# Patient Record
Sex: Female | Born: 1935 | Race: White | Hispanic: No | Smoking: Never smoker
Health system: Southern US, Community
[De-identification: ages and names within clinical notes are randomized; demographics above are authoritative.]

## PROBLEM LIST (undated history)

## (undated) DIAGNOSIS — M542 Cervicalgia: Secondary | ICD-10-CM

## (undated) DIAGNOSIS — R42 Dizziness and giddiness: Secondary | ICD-10-CM

## (undated) DIAGNOSIS — E039 Hypothyroidism, unspecified: Secondary | ICD-10-CM

## (undated) DIAGNOSIS — E785 Hyperlipidemia, unspecified: Secondary | ICD-10-CM

## (undated) DIAGNOSIS — R0989 Other specified symptoms and signs involving the circulatory and respiratory systems: Secondary | ICD-10-CM

## (undated) DIAGNOSIS — L708 Other acne: Secondary | ICD-10-CM

## (undated) DIAGNOSIS — I480 Paroxysmal atrial fibrillation: Secondary | ICD-10-CM

## (undated) DIAGNOSIS — I219 Acute myocardial infarction, unspecified: Secondary | ICD-10-CM

## (undated) DIAGNOSIS — Z9119 Patient's noncompliance with other medical treatment and regimen: Secondary | ICD-10-CM

## (undated) DIAGNOSIS — Z9181 History of falling: Secondary | ICD-10-CM

## (undated) DIAGNOSIS — R55 Syncope and collapse: Secondary | ICD-10-CM

## (undated) DIAGNOSIS — I4891 Unspecified atrial fibrillation: Secondary | ICD-10-CM

## (undated) DIAGNOSIS — G311 Senile degeneration of brain, not elsewhere classified: Secondary | ICD-10-CM

## (undated) DIAGNOSIS — M2040 Other hammer toe(s) (acquired), unspecified foot: Secondary | ICD-10-CM

## (undated) DIAGNOSIS — R5381 Other malaise: Secondary | ICD-10-CM

## (undated) DIAGNOSIS — F329 Major depressive disorder, single episode, unspecified: Secondary | ICD-10-CM

## (undated) DIAGNOSIS — R002 Palpitations: Secondary | ICD-10-CM

## (undated) DIAGNOSIS — IMO0001 Reserved for inherently not codable concepts without codable children: Secondary | ICD-10-CM

## (undated) DIAGNOSIS — G609 Hereditary and idiopathic neuropathy, unspecified: Secondary | ICD-10-CM

## (undated) DIAGNOSIS — E1165 Type 2 diabetes mellitus with hyperglycemia: Secondary | ICD-10-CM

## (undated) DIAGNOSIS — G4700 Insomnia, unspecified: Secondary | ICD-10-CM

## (undated) DIAGNOSIS — F070 Personality change due to known physiological condition: Secondary | ICD-10-CM

## (undated) DIAGNOSIS — I6529 Occlusion and stenosis of unspecified carotid artery: Secondary | ICD-10-CM

## (undated) DIAGNOSIS — J309 Allergic rhinitis, unspecified: Secondary | ICD-10-CM

## (undated) DIAGNOSIS — I6789 Other cerebrovascular disease: Secondary | ICD-10-CM

## (undated) DIAGNOSIS — I10 Essential (primary) hypertension: Secondary | ICD-10-CM

## (undated) DIAGNOSIS — K117 Disturbances of salivary secretion: Secondary | ICD-10-CM

## (undated) DIAGNOSIS — K224 Dyskinesia of esophagus: Secondary | ICD-10-CM

## (undated) DIAGNOSIS — L639 Alopecia areata, unspecified: Secondary | ICD-10-CM

## (undated) DIAGNOSIS — I8393 Asymptomatic varicose veins of bilateral lower extremities: Secondary | ICD-10-CM

## (undated) DIAGNOSIS — D126 Benign neoplasm of colon, unspecified: Secondary | ICD-10-CM

## (undated) DIAGNOSIS — G4733 Obstructive sleep apnea (adult) (pediatric): Secondary | ICD-10-CM

## (undated) DIAGNOSIS — G5600 Carpal tunnel syndrome, unspecified upper limb: Secondary | ICD-10-CM

## (undated) DIAGNOSIS — R5383 Other fatigue: Secondary | ICD-10-CM

## (undated) DIAGNOSIS — N182 Chronic kidney disease, stage 2 (mild): Secondary | ICD-10-CM

## (undated) DIAGNOSIS — M2550 Pain in unspecified joint: Secondary | ICD-10-CM

## (undated) DIAGNOSIS — R079 Chest pain, unspecified: Secondary | ICD-10-CM

## (undated) DIAGNOSIS — R1310 Dysphagia, unspecified: Secondary | ICD-10-CM

## (undated) DIAGNOSIS — R0602 Shortness of breath: Secondary | ICD-10-CM

## (undated) DIAGNOSIS — M25519 Pain in unspecified shoulder: Secondary | ICD-10-CM

## (undated) DIAGNOSIS — Z91199 Patient's noncompliance with other medical treatment and regimen due to unspecified reason: Secondary | ICD-10-CM

## (undated) DIAGNOSIS — Z7901 Long term (current) use of anticoagulants: Secondary | ICD-10-CM

## (undated) DIAGNOSIS — F3289 Other specified depressive episodes: Secondary | ICD-10-CM

## (undated) DIAGNOSIS — R634 Abnormal weight loss: Secondary | ICD-10-CM

## (undated) DIAGNOSIS — K5730 Diverticulosis of large intestine without perforation or abscess without bleeding: Secondary | ICD-10-CM

## (undated) HISTORY — DX: Chest pain, unspecified: R07.9

## (undated) HISTORY — DX: Senile degeneration of brain, not elsewhere classified: G31.1

## (undated) HISTORY — PX: APPENDECTOMY: SHX54

## (undated) HISTORY — DX: Abnormal weight loss: R63.4

## (undated) HISTORY — DX: Insomnia, unspecified: G47.00

## (undated) HISTORY — DX: Other specified symptoms and signs involving the circulatory and respiratory systems: R09.89

## (undated) HISTORY — DX: Major depressive disorder, single episode, unspecified: F32.9

## (undated) HISTORY — DX: Other fatigue: R53.83

## (undated) HISTORY — DX: Acute myocardial infarction, unspecified: I21.9

## (undated) HISTORY — DX: Disturbances of salivary secretion: K11.7

## (undated) HISTORY — DX: Pain in unspecified shoulder: M25.519

## (undated) HISTORY — DX: Dyskinesia of esophagus: K22.4

## (undated) HISTORY — DX: Syncope and collapse: R55

## (undated) HISTORY — DX: Essential (primary) hypertension: I10

## (undated) HISTORY — DX: Dysphagia, unspecified: R13.10

## (undated) HISTORY — DX: Benign neoplasm of colon, unspecified: D12.6

## (undated) HISTORY — DX: Long term (current) use of anticoagulants: Z79.01

## (undated) HISTORY — DX: Type 2 diabetes mellitus with hyperglycemia: E11.65

## (undated) HISTORY — DX: Patient's noncompliance with other medical treatment and regimen: Z91.19

## (undated) HISTORY — DX: Asymptomatic varicose veins of bilateral lower extremities: I83.93

## (undated) HISTORY — PX: TOTAL ABDOMINAL HYSTERECTOMY: SHX209

## (undated) HISTORY — DX: Pain in unspecified joint: M25.50

## (undated) HISTORY — DX: Unspecified atrial fibrillation: I48.91

## (undated) HISTORY — DX: Dizziness and giddiness: R42

## (undated) HISTORY — DX: Other cerebrovascular disease: I67.89

## (undated) HISTORY — DX: Hypothyroidism, unspecified: E03.9

## (undated) HISTORY — DX: Hyperlipidemia, unspecified: E78.5

## (undated) HISTORY — DX: Chronic kidney disease, stage 2 (mild): N18.2

## (undated) HISTORY — DX: Patient's noncompliance with other medical treatment and regimen due to unspecified reason: Z91.199

## (undated) HISTORY — DX: Reserved for inherently not codable concepts without codable children: IMO0001

## (undated) HISTORY — DX: Occlusion and stenosis of unspecified carotid artery: I65.29

## (undated) HISTORY — DX: Paroxysmal atrial fibrillation: I48.0

## (undated) HISTORY — DX: Allergic rhinitis, unspecified: J30.9

## (undated) HISTORY — DX: Palpitations: R00.2

## (undated) HISTORY — DX: Other specified depressive episodes: F32.89

## (undated) HISTORY — DX: Personality change due to known physiological condition: F07.0

## (undated) HISTORY — DX: Carpal tunnel syndrome, unspecified upper limb: G56.00

## (undated) HISTORY — DX: Other hammer toe(s) (acquired), unspecified foot: M20.40

## (undated) HISTORY — DX: Other malaise: R53.81

## (undated) HISTORY — PX: TONSILLECTOMY: SUR1361

## (undated) HISTORY — DX: Alopecia areata, unspecified: L63.9

## (undated) HISTORY — DX: Obstructive sleep apnea (adult) (pediatric): G47.33

## (undated) HISTORY — PX: HAND SURGERY: SHX662

## (undated) HISTORY — DX: Diverticulosis of large intestine without perforation or abscess without bleeding: K57.30

## (undated) HISTORY — DX: Other acne: L70.8

## (undated) HISTORY — DX: Shortness of breath: R06.02

## (undated) HISTORY — DX: History of falling: Z91.81

## (undated) HISTORY — DX: Cervicalgia: M54.2

## (undated) HISTORY — DX: Hereditary and idiopathic neuropathy, unspecified: G60.9

---

## 1998-08-01 ENCOUNTER — Other Ambulatory Visit: Admission: RE | Admit: 1998-08-01 | Discharge: 1998-08-01 | Payer: Self-pay | Admitting: Obstetrics and Gynecology

## 1999-12-03 ENCOUNTER — Encounter: Admission: RE | Admit: 1999-12-03 | Discharge: 1999-12-03 | Payer: Self-pay

## 2000-02-17 ENCOUNTER — Other Ambulatory Visit: Admission: RE | Admit: 2000-02-17 | Discharge: 2000-02-17 | Payer: Self-pay | Admitting: Obstetrics and Gynecology

## 2000-04-22 ENCOUNTER — Encounter: Payer: Self-pay | Admitting: Neurosurgery

## 2000-04-23 ENCOUNTER — Ambulatory Visit (HOSPITAL_COMMUNITY): Admission: RE | Admit: 2000-04-23 | Discharge: 2000-04-23 | Payer: Self-pay | Admitting: Neurosurgery

## 2000-12-16 ENCOUNTER — Ambulatory Visit (HOSPITAL_COMMUNITY): Admission: RE | Admit: 2000-12-16 | Discharge: 2000-12-16 | Payer: Self-pay | Admitting: *Deleted

## 2000-12-16 ENCOUNTER — Encounter (INDEPENDENT_AMBULATORY_CARE_PROVIDER_SITE_OTHER): Payer: Self-pay | Admitting: Specialist

## 2001-04-25 ENCOUNTER — Other Ambulatory Visit: Admission: RE | Admit: 2001-04-25 | Discharge: 2001-04-25 | Payer: Self-pay | Admitting: Obstetrics and Gynecology

## 2002-05-04 ENCOUNTER — Other Ambulatory Visit: Admission: RE | Admit: 2002-05-04 | Discharge: 2002-05-04 | Payer: Self-pay | Admitting: Obstetrics and Gynecology

## 2002-06-12 ENCOUNTER — Inpatient Hospital Stay (HOSPITAL_COMMUNITY): Admission: EM | Admit: 2002-06-12 | Discharge: 2002-06-15 | Payer: Self-pay | Admitting: Internal Medicine

## 2002-06-12 ENCOUNTER — Encounter: Payer: Self-pay | Admitting: Internal Medicine

## 2003-12-14 ENCOUNTER — Emergency Department (HOSPITAL_COMMUNITY): Admission: EM | Admit: 2003-12-14 | Discharge: 2003-12-14 | Payer: Self-pay | Admitting: Emergency Medicine

## 2007-04-19 ENCOUNTER — Inpatient Hospital Stay (HOSPITAL_COMMUNITY): Admission: EM | Admit: 2007-04-19 | Discharge: 2007-04-22 | Payer: Self-pay | Admitting: *Deleted

## 2007-04-20 ENCOUNTER — Encounter (INDEPENDENT_AMBULATORY_CARE_PROVIDER_SITE_OTHER): Payer: Self-pay | Admitting: Cardiology

## 2008-06-25 ENCOUNTER — Encounter (INDEPENDENT_AMBULATORY_CARE_PROVIDER_SITE_OTHER): Payer: Self-pay | Admitting: *Deleted

## 2008-06-25 ENCOUNTER — Ambulatory Visit (HOSPITAL_COMMUNITY): Admission: RE | Admit: 2008-06-25 | Discharge: 2008-06-25 | Payer: Self-pay | Admitting: *Deleted

## 2008-06-25 LAB — HM COLONOSCOPY

## 2009-02-20 ENCOUNTER — Ambulatory Visit (HOSPITAL_COMMUNITY): Admission: RE | Admit: 2009-02-20 | Discharge: 2009-02-20 | Payer: Self-pay | Admitting: Internal Medicine

## 2009-02-27 ENCOUNTER — Encounter: Admission: RE | Admit: 2009-02-27 | Discharge: 2009-02-27 | Payer: Self-pay | Admitting: Internal Medicine

## 2009-06-19 DIAGNOSIS — R079 Chest pain, unspecified: Secondary | ICD-10-CM

## 2009-06-19 HISTORY — DX: Chest pain, unspecified: R07.9

## 2009-06-26 ENCOUNTER — Ambulatory Visit (HOSPITAL_COMMUNITY): Admission: RE | Admit: 2009-06-26 | Discharge: 2009-06-26 | Payer: Self-pay | Admitting: Internal Medicine

## 2010-06-06 ENCOUNTER — Ambulatory Visit (HOSPITAL_COMMUNITY): Admission: RE | Admit: 2010-06-06 | Discharge: 2010-06-06 | Payer: Self-pay | Admitting: Internal Medicine

## 2010-06-06 LAB — HM MAMMOGRAPHY

## 2010-06-16 ENCOUNTER — Encounter: Admission: RE | Admit: 2010-06-16 | Discharge: 2010-06-16 | Payer: Self-pay | Admitting: Internal Medicine

## 2011-01-06 ENCOUNTER — Other Ambulatory Visit: Payer: Self-pay | Admitting: Internal Medicine

## 2011-01-06 DIAGNOSIS — H538 Other visual disturbances: Secondary | ICD-10-CM

## 2011-01-06 DIAGNOSIS — R42 Dizziness and giddiness: Secondary | ICD-10-CM

## 2011-01-07 ENCOUNTER — Ambulatory Visit
Admission: RE | Admit: 2011-01-07 | Discharge: 2011-01-07 | Disposition: A | Discharge status: Home / Self Care | Payer: Medicare Other | Source: Ambulatory Visit | Attending: Internal Medicine | Admitting: Internal Medicine

## 2011-01-07 ENCOUNTER — Ambulatory Visit
Admission: RE | Admit: 2011-01-07 | Discharge: 2011-01-07 | Disposition: A | Discharge status: Home / Self Care | Payer: No Typology Code available for payment source | Source: Ambulatory Visit | Attending: Internal Medicine | Admitting: Internal Medicine

## 2011-01-07 ENCOUNTER — Other Ambulatory Visit: Payer: Self-pay | Admitting: Internal Medicine

## 2011-01-07 ENCOUNTER — Inpatient Hospital Stay: Admission: RE | Admit: 2011-01-07 | Payer: Self-pay | Source: Ambulatory Visit

## 2011-01-07 DIAGNOSIS — R42 Dizziness and giddiness: Secondary | ICD-10-CM

## 2011-01-07 DIAGNOSIS — K460 Unspecified abdominal hernia with obstruction, without gangrene: Secondary | ICD-10-CM

## 2011-01-09 ENCOUNTER — Other Ambulatory Visit: Payer: Self-pay | Admitting: Internal Medicine

## 2011-01-09 DIAGNOSIS — R42 Dizziness and giddiness: Secondary | ICD-10-CM

## 2011-01-12 ENCOUNTER — Ambulatory Visit
Admission: RE | Admit: 2011-01-12 | Discharge: 2011-01-12 | Disposition: A | Discharge status: Home / Self Care | Payer: Medicare Other | Source: Ambulatory Visit | Attending: Diagnostic Radiology | Admitting: Diagnostic Radiology

## 2011-01-12 DIAGNOSIS — R42 Dizziness and giddiness: Secondary | ICD-10-CM

## 2011-01-26 ENCOUNTER — Encounter (INDEPENDENT_AMBULATORY_CARE_PROVIDER_SITE_OTHER): Payer: Medicare Other | Admitting: Surgery

## 2011-01-26 ENCOUNTER — Other Ambulatory Visit (INDEPENDENT_AMBULATORY_CARE_PROVIDER_SITE_OTHER): Payer: Medicare Other

## 2011-01-26 DIAGNOSIS — I6529 Occlusion and stenosis of unspecified carotid artery: Secondary | ICD-10-CM

## 2011-01-29 NOTE — Assessment & Plan Note (Signed)
OFFICE VISIT  [name], [name] DOB:  1925-02-02                                       01/26/2011 EAVWU#:[id number]  REFERRING PHYSICIANS:  Bufford Spikes, DO, and Lenon Curt. Chilton Si, MD.  REASON FOR CONSULTATION:  Carotid disease.  HISTORY:  This is an 77 year old female I am seeing at request of Dr. Renato Gails for evaluation of carotid disease.  The patient states that approximately 3 weeks ago she woke up early in the morning with everything in the room being dark.  When she stood up, she noticed that she was significantly off balance.  She was able to get around with a walker.  She went back to sleep for approximately 4 hours and then felt a little bit better.  Since that time, however, she is feels like she has been very tired.  She has had trouble focusing.  She had a carotid ultrasound performed that revealed no significant stenosis but a right carotid plaque.  Patient has not had any similar symptoms since then. She denies specific findings of amaurosis fugax.  She is not having any slurring of her speech.  The patient suffers from diabetes, hypercholesterolemia and hypertension, all of which medically managed.  She also has atrial fibrillation for which she is anticoagulated with Coumadin.  REVIEW OF SYSTEMS:  VASCULAR:  Positive for temporary blindness. CARDIAC:  Positive for arrhythmia and atrial fibrillation. NEURO:  Positive for dizziness. ENT:  Positive change in eyesight and hearing.  PAST MEDICAL HISTORY:  Diabetes, hypertension, atrial fibrillation, hypercholesterolemia.  PAST SURGICAL HISTORY:  Hysterectomy, carpal tunnel surgery.  SOCIAL HISTORY:  She is widowed with four children.  Does not drink or smoke.  FAMILY HISTORY:  Negative for premature cardiac disease.  PHYSICAL EXAMINATION:  Heart rate 61, blood pressure 176/75 on the right, 150/75 on the left, O2 saturation is 97%.  General:  She is well- appearing, in no distress.  HEENT:   Within normal limits.  Lungs:  Clear bilaterally.  No wheezes or rhonchi.  Cardiovascular:  Regular rate and rhythm.  No murmur.  No carotid bruits.  Abdomen:  Soft, nontender. Musculoskeletal:  No major deformities.  Neuro:  No focal deficits. Skin:  Without rash.  DIAGNOSTIC STUDIES:  I repeated her carotid ultrasound today.  This shows no significant stenosis within the right internal carotid artery. There is a moderate stenosis in the right external carotid artery with an ulcerated plaque at the bulb and into the proximal internal carotid artery.  ASSESSMENT/PLAN:  Carotid stenosis, mild.  PLAN:  Given the patient's global symptoms, I do not feel like this nice isolated right carotid area is the source of her symptoms.  In addition, it is associated with a very minimal stenosis.  I think the role of surgical intervention in this would be very low-yield.  I think in this setting we need to focus on aggressive medical management of her atherosclerotic disease.  I see her most recent cholesterol was an LDL of 112.  I think she would greatly benefit from being placed on a statin.  We will need to follow her carotid arteries over time as this could progress.  I would get a repeat ultrasound in 6 months.  I reiterated to the patient that I do not feel that her symptoms which were global in origin are related to her right carotid lesion.  Other  possibilities would include vertigo or infection.  I will plan on seeing her back in 6 months.    Jorge Ny, MD Electronically Signed  VWB/MEDQ  D:  01/26/2011  T:  01/27/2011  Job:  [id number]  cc:   Bufford Spikes, DO Lenon Curt. Chilton Si, M.D.

## 2011-02-10 NOTE — Procedures (Unsigned)
CAROTID DUPLEX EXAM  INDICATION:  Carotid artery disease.  HISTORY: Diabetes:  Yes. Cardiac:  No. Hypertension:  Yes. Smoking:  No. Previous Surgery:  No. CV History:  Visual loss and vertigo. Amaurosis Fugax Yes No, Paresthesias Yes No, Hemiparesis Yes No                                      RIGHT             LEFT Brachial systolic pressure: Brachial Doppler waveforms: Vertebral direction of flow:        Antegrade DUPLEX VELOCITIES (cm/sec) CCA peak systolic                   61 ECA peak systolic                   167 ICA peak systolic                   111 ICA end diastolic                   23 PLAQUE MORPHOLOGY:                  Heterogeneous and ulcerative PLAQUE AMOUNT:                      Mild to moderate PLAQUE LOCATION:                    ICA and ECA  IMPRESSION:  Right side no hemodynamically significant stenosis within internal carotid artery.  Right external carotid artery stenosis. Ulcerative plaque within in the bulb in the proximal internal carotid artery.  ___________________________________________ V. Charlena Cross, MD  OD/MEDQ  D:  01/26/2011  T:  01/26/2011  Job:  [id number]

## 2011-02-10 NOTE — Procedures (Unsigned)
CAROTID DUPLEX EXAM  INDICATION:  Carotid stenosis.  HISTORY: Diabetes:  Yes. Cardiac:  No. Hypertension:  Yes. Smoking:  No. Previous Surgery:  No. CV History:  Visual loss and vertigo. Amaurosis Fugax Yes No, Paresthesias Yes No, Hemiparesis Yes No                                      RIGHT             LEFT Brachial systolic pressure: Brachial Doppler waveforms: Vertebral direction of flow: DUPLEX VELOCITIES (cm/sec) CCA peak systolic                   61 ECA peak systolic                   162 ICA peak systolic                   111 ICA end diastolic                   23 PLAQUE MORPHOLOGY:                  Heterogeneous ulcerative PLAQUE AMOUNT:                      Mild to moderate PLAQUE LOCATION:                    ICA and ECA  IMPRESSION:  Right side no hemodynamically significant stenosis within the internal carotid artery.  Right external carotid artery stenosis. Ulcerative plaque at the bulb in the proximal internal carotid artery.  ___________________________________________ V. Charlena Cross, MD  OD/MEDQ  D:  01/26/2011  T:  01/26/2011  Job:  [id number]

## 2011-03-02 ENCOUNTER — Emergency Department (HOSPITAL_COMMUNITY)
Admission: EM | Admit: 2011-03-02 | Discharge: 2011-03-02 | Disposition: A | Discharge status: Home / Self Care | Payer: Medicare Other | Attending: Emergency Medicine | Admitting: Emergency Medicine

## 2011-03-02 ENCOUNTER — Emergency Department (HOSPITAL_COMMUNITY): Payer: Medicare Other

## 2011-03-02 DIAGNOSIS — I4891 Unspecified atrial fibrillation: Secondary | ICD-10-CM | POA: Insufficient documentation

## 2011-03-02 DIAGNOSIS — E119 Type 2 diabetes mellitus without complications: Secondary | ICD-10-CM | POA: Insufficient documentation

## 2011-03-02 DIAGNOSIS — R002 Palpitations: Secondary | ICD-10-CM | POA: Insufficient documentation

## 2011-03-02 DIAGNOSIS — I10 Essential (primary) hypertension: Secondary | ICD-10-CM | POA: Insufficient documentation

## 2011-03-02 DIAGNOSIS — Z79899 Other long term (current) drug therapy: Secondary | ICD-10-CM | POA: Insufficient documentation

## 2011-03-02 DIAGNOSIS — E039 Hypothyroidism, unspecified: Secondary | ICD-10-CM | POA: Insufficient documentation

## 2011-03-02 DIAGNOSIS — Z7901 Long term (current) use of anticoagulants: Secondary | ICD-10-CM | POA: Insufficient documentation

## 2011-03-02 LAB — CBC
HCT: 37.3 % (ref 36.0–46.0)
Hemoglobin: 13 g/dL (ref 12.0–15.0)
MCHC: 34.9 g/dL (ref 30.0–36.0)
MCV: 93 fL (ref 78.0–100.0)
Platelets: 246 10*3/uL (ref 150–400)
RBC: 4.01 MIL/uL (ref 3.87–5.11)
WBC: 8 10*3/uL (ref 4.0–10.5)

## 2011-03-02 LAB — DIFFERENTIAL
Basophils Absolute: 0.1 10*3/uL (ref 0.0–0.1)
Eosinophils Absolute: 0.4 10*3/uL (ref 0.0–0.7)
Lymphocytes Relative: 24 % (ref 12–46)
Lymphs Abs: 1.9 10*3/uL (ref 0.7–4.0)
Monocytes Relative: 9 % (ref 3–12)
Neutro Abs: 5 10*3/uL (ref 1.7–7.7)

## 2011-03-02 LAB — POCT CARDIAC MARKERS: Myoglobin, poc: 77.9 ng/mL (ref 12–200)

## 2011-03-02 LAB — URINALYSIS, ROUTINE W REFLEX MICROSCOPIC
Hgb urine dipstick: NEGATIVE
Protein, ur: NEGATIVE mg/dL

## 2011-03-02 LAB — POCT I-STAT, CHEM 8
BUN: 19 mg/dL (ref 6–23)
Calcium, Ion: 1.14 mmol/L (ref 1.12–1.32)
Creatinine, Ser: 0.9 mg/dL (ref 0.4–1.2)
Glucose, Bld: 139 mg/dL — ABNORMAL HIGH (ref 70–99)
Hemoglobin: 13.3 g/dL (ref 12.0–15.0)
Sodium: 138 mEq/L (ref 135–145)

## 2011-03-02 LAB — PHOSPHORUS: Phosphorus: 2.9 mg/dL (ref 2.3–4.6)

## 2011-03-02 LAB — PROTIME-INR
INR: 2.57 — ABNORMAL HIGH (ref 0.00–1.49)
Prothrombin Time: 27.7 seconds — ABNORMAL HIGH (ref 11.6–15.2)

## 2011-04-21 NOTE — Discharge Summary (Signed)
[name] NO.:  [id number]   MEDICAL RECORD NO.:  [id number]          PATIENT TYPE:  INP   LOCATION:  6529                         FACILITY:  [facility]   PHYSICIAN:  Nicki Guadalajara, M.D.     DATE OF BIRTH:  1925/10/09   DATE OF ADMISSION:  04/19/2007  DATE OF DISCHARGE:                               DISCHARGE SUMMARY   DISCHARGE DIAGNOSES:  1. Atrial fibrillation, brief.  2. Syncope and dizziness by history.  3. Non-insulin-dependent diabetes.  4. Treated hypertension.  5. Nonobstructive coronary disease with normal left ventricular      function at catheterization this admission.   HOSPITAL COURSE:  The patient is a 77 year old female who is followed by  Dr. Frederik Pear with a history of lightheadedness and dizziness as well as  weakness and shortness of breath.  In February of this year she had a  syncopal spell where she was standing at a table and blacked out,  falling backwards.  Her records say she had previously undergone Doppler  study, echocardiogram and stress test without diagnosis.  She was  admitted Apr 19, 2007 with weakness and shortness of breath.  She was  initially in atrial fibrillation with a ventricular response of about  100 and then converted to sinus rhythm.  She was seen by Dr. Tresa Endo on  admission.  D. dimer was slightly elevated at 0.5.  CT scan of the chest  showed no pulmonary embolism but she did have some right lower lobe  nodules, follow-up CT scan is recommended in three months.  Echocardiogram done revealed normal LV function.  There was an increased  contribution atrial contraction to the left ventricular filling and some  intra-atrial septal bowing consistent with the possibility of a patent  foramen ovale.  There was mild to moderate right ventricular dilatation  and moderate RV pressure increase.  The patient remained in sinus rhythm  and had no further syncope or arrhythmia.  It was decided to put her on  Coumadin as  she would be at high risk for stroke.  She is discharged Apr 22, 2007.   DISCHARGE MEDICATIONS:  1. Coumadin 3 mg a day.  2. Metoprolol 25 mg twice a day.  3. Synthroid 0.1 mg a day.  4. Estradiol 0.4 mg a day.  5. Aldactone 25 mg a day.  6. Metformin 500 mg twice a day which she will start Sunday.   LABS:  White count 8.1, hemoglobin 13.1, hematocrit 37.3, platelets 280.  Sodium 136, potassium 3.8, BUN 15, creatinine 0.79.  Liver functions  were normal.  CK-mB and troponins are negative.  LDL was 101 magnesium  was 2.  Hemoglobin A1c 6.4.  TSH 0.53.  D. Dimer is 0.51, free T4 is  1.77.  CT angiogram of the chest is as noted above. Chest x-ray shows  COPD.  INR is 1.1.  EKG at discharge shows sinus rhythm without acute  changes.   DISPOSITION:  The patient is discharged in stable condition on Coumadin.  She will need a pro time Tuesday next week.  She will  need a follow-up  CT in about three months.  She will also be set up for a bubble study as  an outpatient.      Abelino Derrick, P.A.    ______________________________  Nicki Guadalajara, M.D.    Lenard Lance  D:  04/22/2007  T:  04/22/2007  Job:  [id number]

## 2011-04-21 NOTE — Cardiovascular Report (Signed)
[name], HELD NO.:  [id number]   MEDICAL RECORD NO.:  [id number]          PATIENT TYPE:  INP   LOCATION:  6529                         FACILITY:  [facility]   PHYSICIAN:  Darlin Priestly, MD  DATE OF BIRTH:  1925/12/01   DATE OF PROCEDURE:  04/21/2007  DATE OF DISCHARGE:                            CARDIAC CATHETERIZATION   PROCEDURES:  1. Left heart catheterization.  2. Coronary angiography.  3. Left ventriculogram.   COMPLICATIONS:  None.   INDICATION:  [name] is a 77 year old female, patient of Dr. Frederik Pear, with a history of diabetes, hyperthyroidism, hypertension,  history of recurrent syncope of unknown etiology.  She has recurrent  palpitations, ultimately found to have atrial fibrillation with rapid  ventricular response.  She was subsequently admitted to the hospital  with IV beta blocker and anticoagulation and converted to sinus rhythm.  She is now brought for cardiac catheterization to rule out significant  CAD prior to Coumadin loading.   DESCRIPTION OF OPERATION:  After obtaining informed consent, the patient  was brought into the cardiac catheterization laboratory where the right  groin are shaved, prepped and draped in the usual sterile fashion.  ECG  monitoring was established.  Using modified Seldinger technique, a #6  French arterial sheath is brought up the femoral artery.  The 6-French  diagnostic catheter used to perform diagnostic angiography.   The left main is a large vessel with mild calcification but no  significant disease.   The LAD is a medium size vessel which courses onto the apex.  There is  one diagonal branch.  The LAD has mild 20% ostial narrowing.  The  remainder of the LAD has no significant disease.   The first diagonal is a medium size vessel with 30% proximal lesion.   The left coronary artery is medium size, ramus is medium size and  bifurcates distally.  There is no significant disease in the  ramus.   The left circumflex is a medium size vessel which is codominant giving  rise to PDA and posterior lateral branch.  There is no significant  disease in the AV groove circumflex.   PDA and posterior lateral branch are medium size vessels, again, noted  to be codominant with no significant disease.   The right coronary artery is a small to medium size vessel which is  noted to be codominant.  It gives rise to a small PDA with no  significant disease.   Left ventriculogram:  There is a low EF at 50% with mild anterior  lateral hypokinesis.   HEMODYNAMIC SYSTEM:  With regards to pressure 134/57, LV system pressure  132/5, LV to PF 11.   CONCLUSIONS:  1. No significant CAD.  2. Low normal EF, wall motion abnormalities noted above.      Darlin Priestly, MD     RHM/MEDQ  D:  04/21/2007  T:  04/21/2007  Job:  [id number]   cc:   Lenon Curt. Chilton Si, M.D.

## 2011-04-21 NOTE — Op Note (Signed)
NAMELALANYA, [name] NO.:  [id number]   MEDICAL RECORD NO.:  [id number]          PATIENT TYPE:  AMB   LOCATION:  ENDO                         [facility]ACILITY[facility]  [facility]   PHYSICIAN:  Georgiana Spinner, M.D.    DATE OF BIRTH:  July 12, 1925   DATE OF PROCEDURE:  DATE OF DISCHARGE:                               OPERATIVE REPORT   PROCEDURE:  Colonoscopy.   INDICATIONS:  Colon polyps.   ANESTHESIA:  1. Fentanyl 75 mcg.  2. Versed 7 mg.   PROCEDURE:  With the patient mildly sedated in the left lateral  decubitus position, the Pentax videoscopic colonoscope was inserted into  the rectum and passed under direct vision to the sigmoid colon, at which  point I encountered difficulty with making a turn so I withdrew this  colonoscope and inserted a Pentax pediatric colonoscope and was able to  pass through this point with pressure applied and patient subsequently  rolled to her back.  We were able to reach the cecum identified by  ileocecal valve and appendiceal orifice, both of which were  photographed.  From this point we photographed an AVM in the cecum which  was small.  From this point the colonoscope was then slowly withdrawn,  taking circumferential views of colonic mucosa, stopping at the hepatic  flexure where a polyp was seen, photographed in partial view and first  removed with hot biopsy forceps technique, then subsequently, using the  ERBE argon photocoagulator we were able to eradicate the remainder of  the polyp on this fold, after getting position and rolling the patient  in various positions to be able to access this.  Subsequently, the  colonoscope was then withdrawn taking circumferential views of remaining  colonic mucosa, stopping then only in the rectum which appeared normal  on direct, showed hemorrhoids on retroflexed view.  The endoscope was  straightened and withdrawn.  The patient's vital signs, pulse oximeter  remained stable.  The patient tolerated  procedure well, there were no  apparent complications.   FINDINGS:  Small AVM of cecum, diverticulosis of sigmoid colon, internal  hemorrhoids and polyp of hepatic flexure removed and eradicated with  argon photocoagulator.   PLAN:  Await biopsy report.  The patient will call me for results and  follow up with me as an outpatient.  Resume Coumadin in 48 hours           ______________________________  Georgiana Spinner, M.D.     GMO/MEDQ  D:  06/25/2008  T:  06/25/2008  Job:  [id number]   cc:   Lenon Curt. Chilton Si, M.D.  Fax: [phone]

## 2011-04-24 HISTORY — PX: TRANSTHORACIC ECHOCARDIOGRAM: SHX275

## 2011-04-24 NOTE — H&P (Signed)
[facility]  Patient:    [name], [name] Visit Number: [id number] MRN: [id number]          Service Type: MED Location: 3W [id number] [id number] Attending Physician:  Kimber Relic Dictated by:   Lenon Curt. Cassell Clement, M.D. Admit Date:  06/12/2002 Discharge Date: 06/15/2002   CC:         Sabino Gasser, M.D.   History and Physical  CHIEF COMPLAINT:  Increasing abdominal pain since June 08, 2002.  HISTORY OF PRESENT ILLNESS:  Lower abdominal pain for four days prior to admission and getting worse.  The patient denies fever or chills, vomiting, or diarrhea.  Stools were red the day prior to admission and there was some mucus in the stool.  The patient had taken Aleve with only modest help.  She has a prior history of diverticulosis and colon polyps.  Her last colonoscopy was in about 2001 by Dr. Sabino Gasser.  She denies any vaginal discharge or dysuria.  PAST MEDICAL HISTORY:  1. 1977, hypertension.  2. 1977, headaches.  3. 1987, depression.  4. 1976, abnormal chest x-ray:  Biliary calcifications.  5. 1984, syncope.  6. 1990, hyperglycemia.  7. November 1994, DJD of the cervical spine.  8. 1997, telangiectases.  9. Hypothyroid. 10. Right carpal tunnel syndrome. 11. Diverticulosis. 12. NIDDM (diet controlled). 13. Hammertoe of the right second toe.  SURGERY:  1. 1977, hysterectomy for menorrhagia.  2. May 2001, right carpal tunnel release by Dr. Venetia Maxon.  PROCEDURES:  1. October 1984, EEG:  Focal right temporal slowing.  2. October 1984, Holter monitors in the colon, sinus tachycardia, rare     SVT.  3. November 1994, MRI of the cervical spine:  Bulging disk and foraminal     narrowing.  4. May 1999, bone density:  Normal.  5. October 1998, colonoscopy, colon polypectomy, and diverticulosis.  6. April 2001, upper extremity EMG and PNCV:  Right carpal tunnel.  7. 2001, colonoscopy ( _________ ) unchanged.  Diverticulosis.  CONSULTANTS:  1. Dr. Ambrose Mantle,  GYN.  2. Dr. Newell Coral, headaches.  3. Dr. Charlett Blake, orthopedics.  4. Dr. Nelle Don, ophthalmology.  5. Dr. Yetta Barre, dermatology.  6. Dr. Virginia Rochester, gastroenterology.  FAMILY HISTORY:  Father died with Parkinsons disease and complications. Mother died of cancer of the cervix, age 53.  She had three brothers, two diabetics, and one living and well.  No sisters.  Four children, two sons and two daughters, all reportedly living and well.  SOCIAL HISTORY:  Married in 1949.  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  She retired in 1991.  No tobacco or alcohol use.  She owns her home and lives with a disabled husband.  Emergency contact would be Narda Bonds, a daughter, at [phone].  She does have a living will.  ALLERGIES:  None.  MEDICATIONS:  1. Premarin 0.625 mg daily.  2. Aldactone 25 mg b.i.d.  3. Calcium one daily.  4. MSM two b.i.d.  5. Glucosamine chondroitin sulfate one t.i.d.  6. Synthroid 75 mcg daily.  7. Multivitamin one daily.  8. Soy protein daily.  9. Zinc daily. 10. Alfalfa daily.  REVIEW OF SYSTEMS:  Fairly unremarkable except in regards to history of present illness.  She has diffuse arthralgias and chronic right wrist pain. There is no recent dysuria, increasing rash, or headaches.  PHYSICAL EXAMINATION:  VITAL SIGNS:  Temperature 99.4, pulse 80, blood pressure 120/82, weight 162 pounds.  GENERAL:  This is  an alert, elderly female, loosely oriented, tearful due to abdominal discomfort and pain.  HEENT:  Prescription lenses are present, sclerae white, conjunctiva clear. Pupils equal, round, and reactive to light and accommodation.  Extraocular movements full.  Pinnae, external auditory canals, tympanic membranes, and hearing all grossly normal.  Oropharynx without acute lesions.  NECK:  Supple.  No thyromegaly, nodule, mass, bruit, or adenopathy.  NODES:  No palpable cervical, axillary, inguinal, or other areas.  BREASTS:   Normal female without tenderness, nodules, or mass.  CHEST:  Clear to auscultation and percussion.  HEART:  Regular rhythm without gallop, murmur, click, rub, heave, or thrill.  ABDOMEN:  Active bowel sounds, tender right upper quadrant, and exquisitely tender across the lower abdomen.  There are no palpable masses, no organomegaly.  RECTAL:  Increased sphincter tone.  Stool is rusty red and strongly Heme-positive.  There were no palpable masses or hemorrhoids.  PELVIC:  Quite tender with insertion of two fingers.  She is status post hysterectomy.  There is no discharge.  SPINE:  Normal.  EXTREMITIES:  Full range of motion without deformities.  NEUROLOGIC:  Cranial nerves intact.  No asterixis or tremor.  Deep tendon reflexes +3 and symmetric.  CIRCULATION:  Normal dorsalis pedis and posterior tibial pulses.  IMPRESSION:  Abdominal pain with Heme-positive stool, likely secondary to diverticulitis.  PLAN:  Due to the intensity of pain and progression of her symptoms, hospitalization is requested.  The patient will be given parenteral antibiotics and analgesics initially.  Will consider consultation with her gastroenterologist, Dr. Sabino Gasser. Dictated by:   Lenon Curt. Cassell Clement, M.D. Attending Physician:  Kimber Relic DD:  06/22/02 TD:  06/25/02 Job: [id number]

## 2011-04-24 NOTE — Procedures (Signed)
[facility]  Patient:    [name], [name]                       MRN: [id number] Proc. Date: 12/16/00 Adm. Date:  [id number] Attending:  Sabino Gasser                           Procedure Report  PROCEDURE:  Colonoscopy.  GASTROENTEROLOGIST:  Sabino Gasser, M.D.  INDICATIONS: Colon polyps.  ANESTHESIA:  Demerol 80 mg, Versed 8 mg.  PROCEDURE IN DETAIL:  With the patient mildly sedated and in the left lateral decubitus position, the Olympus video pediatric colonoscope was inserted in the rectum and then passed under direct vision to the cecum.  The cecum was identified by ileocecal valve and appendiceal orifice, both of which were photographed.  From this point, the colonoscope was slowly withdrawn, taking circumferential views of the entire colonic mucosa, stopping in the sigmoid area where diverticula were seen and a small polyp was seen and removed using hot biopsy forceps technique with setting 20/20 blended current.  The colonoscope was withdrawn to the rectum which appeared normal on direct  and retroflexed view other than hemorrhoids seen on retroflexed view.  The endoscope was straightened and withdrawn.  The patients vital signs and pulse oximetry remained stable.  The patient tolerated the procedure well with no apparent complications.  FINDINGS: 1. Internal hemorrhoids. 2. Polyp in the sigmoid colon. 3. Diverticulosis of sigmoid colon.  PLAN:  Await biopsy report.  The patient will call me for results and follow up with me as an outpatient. DD:  12/16/00 TD:  12/16/00 Job: [id number]

## 2011-04-24 NOTE — Op Note (Signed)
. [facility]  Patient:    [name], [name]                       MRN: [id number] Proc. Date: 04/23/00 Adm. Date:  [id number] Disc. Date: [id number] Attending:  Josie Saunders Dictator:   Danae Orleans. Venetia Maxon, M.D.                           Operative Report  PREOPERATIVE DIAGNOSIS:  Right carpal tunnel syndrome.  POSTOPERATIVE DIAGNOSIS:  Right carpal tunnel syndrome.  PROCEDURE:  Right carpal tunnel release.  SURGEON:  Dr. Venetia Maxon.  ANESTHESIA:  Local with MAC.  COMPLICATIONS:  None.  DISPOSITION:  Recovery.  INDICATIONS:  [name] is a 77 year old woman with right carpal tunnel syndrome.  It was elected to take her to surgery.  DESCRIPTION OF PROCEDURE:  [name] was brought to the operating room.  She had an intravenous line established and was given conscious sedation during the procedure.  Her right hand and forearm were prepped and draped in the usual sterile fashion with stockinette drape.  Incision was marked out on the hand on the palmar surface of the hand along the orientation of the fourth ray.  It was infiltrated with local lidocaine.  Incision was made with a 15 blade.  The transverse carpal ligament was identified and incised initially with a 15 blade and subsequently with tenotomy scissors.  More proximal dissection and release of the transverse carpal ligament was done up into the forearm, and the palmar aspect of the transverse carpal ligament was incised as well, revealing the median nerve which coursed into the hand unencumbered. The wound was then copiously irrigated with bacitracin saline.  The incision was closed with interrupted 3-0 Nylon vertical mattress stitch.  The wound was dressed with bacitracin, Telfa, Kerlix, and cling wrap.  The patient tolerated the procedure well.  She was taken to the recovery room. DD:  04/23/00 TD:  04/28/00 Job: [id number]

## 2011-04-24 NOTE — Discharge Summary (Signed)
[facility]  Patient:    SARIAH, [name] Visit Number: [id number] MRN: [id number]          Service Type: MED Location: [id number] Attending Physician:  Kimber Relic Dictated by:   Terald Sleeper, M.D. Admit Date:  06/12/2002 Discharge Date: 06/15/2002                             Discharge Summary  DATE OF BIRTH:  15-Aug-2025  CHIEF COMPLAINT:  Increasing abdominal pain since Thursday.  HISTORY OF PRESENT ILLNESS:  The patient has had lower abdominal pain for four days which was getting worse.  She denies fever, chills, vomiting, or diarrhea.  Stools were red yesterday, some mucous in stool.  Past history of diverticulosis of the colon and polyps.  Had colonoscopy in 2001 by Dr. Virginia Rochester.  PAST MEDICAL HISTORY: 1. Non-insulin-dependent diabetes mellitus. 2. Hypertension. 3. Osteoarthritis. 4. Hysterectomy. 5. Syncope in 1984. 6. History of hypothyroidism. 7. Right carpal tunnel syndrome. 8. Diet controlled diabetes.  REVIEW OF SYSTEMS:  GENITOURINARY:  No dysuria.  PHYSICAL EXAMINATION:  VITAL SIGNS:  Temperature 99.4, pulse 80, blood pressure 120/82, weight 162 pounds.  GENERAL:  The patient was alert and oriented.  HEENT:  Normal.  NECK:  Nodes were negative.  BREASTS:  Clear of masses.  CHEST:  Clear.  CARDIOVASCULAR:  Heart sounds were normal, no murmurs.  ABDOMEN:  Active bowel sounds, very tender in the right upper quadrant and exquisitely tender across the lower quadrant bilaterally.  There was no organomegaly.  RECTAL:  Increased sphincter tone, stool was rusty, red, and strongly heme positive.  LABORATORY DATA:  Abdominal views:  There was no free air or free fluid. There was a normal amount of stool and a nondistended colon.  Abdominal ultrasound showed gallstones, findings thought to represent a small hemangioma in the left left lobe of the liver.  Urinalysis was normal.  TSH was 1.538.  Comprehensive metabolic  panel showed a sodium of 134, potassium 3.7, chloride 99, CO2 25, glucose on admission was 287, BUN 11, creatinine 0.8.  Albumin 3.5.  Liver function tests were normal. Amylase was 51, lipase was 21.  Admission white blood cell count was 16.9, hemoglobin was 13.4.  On 06/14/02, her white blood cell count was 8.9, hemoglobin 12.3.  Urine for CNS was negative.  HOSPITAL COURSE:  This pleasant 77 year old woman was admitted with cramping lower abdominal pain for four days.  She had strongly positive heme positive stools.  She was admitted with empiric diagnosis of diverticulitis.  She was treated with Unasyn IV and Flagyl IV, and rapidly improved on this regimen. On 06/14/02, she was switched to Flagyl and Augmentin, and had her diet increased to include solid food.  At discharge day, she is having no abdominal tenderness, tolerating an oral diet, and doing very well.  Her white blood cell count has come down.  She is afebrile.  Additional problems during this hospitalization included type 2 diabetes mellitus.  Her CBGs reflected the stress of her coexisting infection, often running in the high 100 to 200 range.  On one occasion were even over 300. The patient relates a fairly long history of type 2 diabetes mellitus which has been diet controlled.  Additionally, she is somewhat concerned about numbness in her feet which she has had for a number of years (even when Dr. Pollyann Kennedy looked after her).  Ultrasound of the abdomen was negative,  however, it did show gallstones.  The gallstones were felt to represent an incidental finding, and certainly did not fit with her cramping lower abdominal pain and tenderness on exam.  FINAL DIAGNOSES: 1. Acute diverticulitis, currently much better. 2. Non-insulin-dependent diabetes mellitus (type 2 diabetes mellitus).    Currently, this is still fairly active with a CBG on 06/14/02 done which    showed a CBG of 214 on 06/14/02.  This morning, it is 96.  My general  feeling    is this woman probably is going to require treatment, although I am less    than 100% certain about this.  I am going to discharge her on Glucophage    500 mg b.i.d.  I am also providing her a Glucometer with diabetic test    strips and instructions to take her blood sugar b.i.d., fasting and a.c.    supper, and to bring those to the office for followup in two weeks. 3. Incidental gallstones.  DISCHARGE MEDICATIONS:  Essentially the same as on admission, plus Flagyl 500 mg t.i.d. and Augmentin 875 mg b.i.d. x7 days, Glucophage 500 mg b.i.d. indefinitely until reviewed in the office.  DISCHARGE INSTRUCTIONS: 1. The patient was counseled in a high fiber diet. 2. I am also going to provide diabetic diet instructions, and as mentioned a    Glucometer at discharge.  FOLLOWUP:  At return to the office visit, she will need a CBC to follow up on her heme positive stools which are likely secondary to diverticulitis.  CONDITION ON DISCHARGE:  Much improved. Dictated by:   Terald Sleeper, M.D. Attending Physician:  Kimber Relic DD:  06/15/02 TD:  06/18/02 Job: [id number]

## 2011-06-19 ENCOUNTER — Encounter: Payer: Self-pay | Admitting: Pulmonary Disease

## 2011-06-22 ENCOUNTER — Encounter: Payer: Self-pay | Admitting: Pulmonary Disease

## 2011-06-22 ENCOUNTER — Ambulatory Visit (INDEPENDENT_AMBULATORY_CARE_PROVIDER_SITE_OTHER): Payer: Medicare Other | Admitting: Pulmonary Disease

## 2011-06-22 VITALS — BP 148/70 | HR 66 | Temp 98.4°F | Ht 65.0 in | Wt 145.0 lb

## 2011-06-22 DIAGNOSIS — G4733 Obstructive sleep apnea (adult) (pediatric): Secondary | ICD-10-CM | POA: Insufficient documentation

## 2011-06-22 NOTE — Progress Notes (Signed)
  Subjective:    Patient ID: [name], female    DOB: 1925/01/27, 77 y.o.   MRN: [id number]  HPI The pt is an 77y/o female who I have been asked to see for management of osa.  She was diagnosed with severe sleep apnea by npsg 04/2011, with AHI 44/hr and found to have cpap intolerance.  She converted to bilevel and ultimately titrated to a final pressure of 15/11.  However, she had a significant increase in central apneas during this time which suggested a problem with pressure induced central apnea.  She has not been started on a positive pressure device currently.  Her history is significant for: -she has been noted to have snoring, but lives alone with no one to monitory for abnl breathing pattern -unrested in am's upon arising more often than not. -c/o daytime fatigue, and will fall asleep if left unstimulated.  + EDS with reading, computer, and driving at times.  - weight down 5-10 pounds over the last 2 yrs.   Sleep Questionnaire: What time do you typically go to bed?( Between what hours) 10pm to 11 pm How long does it take you to fall asleep? 5 mins How many times during the night do you wake up? 0 What time do you get out of bed to start your day? 0730 Do you drive or operate heavy machinery in your occupation? No How much has your weight changed (up or down) over the past two years? (In pounds) 10 lb (4.536 kg) Have you ever had a sleep study before? Yes If yes, location of study? [facility] If yes, date of study? March 2012 Do you currently use CPAP? No Do you wear oxygen at any time? No    Review of Systems  Constitutional: Negative for fever and unexpected weight change.  HENT: Positive for rhinorrhea, trouble swallowing and dental problem. Negative for ear pain, nosebleeds, congestion, sore throat, sneezing, postnasal drip and sinus pressure.   Eyes: Negative for redness and itching.  Respiratory: Positive for shortness of breath. Negative for cough, chest tightness  and wheezing.   Cardiovascular: Positive for palpitations. Negative for leg swelling.  Gastrointestinal: Negative for nausea and vomiting.  Genitourinary: Negative for dysuria.  Musculoskeletal: Negative for joint swelling.  Skin: Negative for rash.  Neurological: Negative for headaches.  Hematological: Does not bruise/bleed easily.  Psychiatric/Behavioral: Negative for dysphoric mood. The patient is not nervous/anxious.        Objective:   Physical Exam Constitutional:  Well developed, no acute distress  HENT:  Nares patent without discharge,minimal septal deviation to left  Oropharynx without exudate, palate and uvula are normal  Mild overbite noted.   Eyes:  Perrla, eomi, no scleral icterus  Neck:  No JVD, no TMG  Cardiovascular:  Normal rate, sounds regular, no rubs or gallops.  No murmurs        Intact distal pulses  Pulmonary :  Normal breath sounds, no stridor or respiratory distress   No rales, rhonchi, or wheezing  Abdominal:  Soft, nondistended, bowel sounds present.  No tenderness noted.   Musculoskeletal:  No lower extremity edema noted. +varicosities  Lymph Nodes:  No cervical lymphadenopathy noted  Skin:  No cyanosis noted  Neurologic:  Alert, appropriate, moves all 4 extremities without obvious deficit.         Assessment & Plan:

## 2011-06-22 NOTE — Assessment & Plan Note (Signed)
The pt has severe osa by her sleep study, and describes nonrestorative sleep and definite daytime sleepiness.  She was intolerant of cpap during her titration study, but has pressure induced central apneas once her bilevel was increased to the 15/11 level.  I suspect we can get her by on lower pressures for her OSA without inducing centrals.  Will start on bilevel at a lower pressure and see how she does.

## 2011-06-22 NOTE — Patient Instructions (Signed)
Will arrange for a bipap machine, and will set on mild to moderate pressure to allow you to adapt over time.  Will optimize your pressure at a later date.  Please call if having tolerance issues.  followup with me in 5 weeks.

## 2011-07-02 ENCOUNTER — Encounter: Payer: Self-pay | Admitting: Surgery

## 2011-07-27 ENCOUNTER — Other Ambulatory Visit (INDEPENDENT_AMBULATORY_CARE_PROVIDER_SITE_OTHER): Payer: Medicare Other

## 2011-07-27 ENCOUNTER — Ambulatory Visit (INDEPENDENT_AMBULATORY_CARE_PROVIDER_SITE_OTHER): Payer: Medicare Other | Admitting: Surgery

## 2011-07-27 ENCOUNTER — Encounter: Payer: Self-pay | Admitting: Surgery

## 2011-07-27 VITALS — BP 148/61 | HR 59 | Ht 66.0 in | Wt 140.0 lb

## 2011-07-27 DIAGNOSIS — I6529 Occlusion and stenosis of unspecified carotid artery: Secondary | ICD-10-CM

## 2011-07-27 NOTE — Progress Notes (Signed)
Subjective:     Patient ID: [name], female   DOB: [birth year], 77 y.o.   MRN: [id number]  HPI Patient returns today for followup of her carotid stenosis. She is an 77 year old female that I met in February of this year for evaluation of her carotid disease. At that time she just recently had an episode where she woke up early in the morning with the remaining dark when she stood up she noted that she was significantly off balance. She went back to sleep and 4 hours later she felt a little better however since that time she been feeling tired and having trouble focusing. She had a carotid ultrasound showed no significant stenosis but her right carotid plaque. She comes back today for a followup. She has had one episode of atrial fibrillation which cause her to go to the hospital. She is also currently being worked up for abnormal sleep patterns. She denies signs or symptoms of carotid occlusive disease including no evidence of amaurosis no slurred speech no numbness or weakness neither extremity.  He continues to be medically managed for her atrial fibrillation with Coumadin she is also treated for diabetes hypertension hypercholesterolemia. Review of systems: Negative except as above  Review of Systems     Past Medical History  Diagnosis Date  . OSA (obstructive sleep apnea)   . Atrial fibrillation   . Myocardial infarct   . Diabetes mellitus   . Hearing loss   . Hypertension   . Hyperlipidemia     History  Substance Use Topics  . Smoking status: Never Smoker   . Smokeless tobacco: Not on file  . Alcohol Use: No    Family History  Problem Relation Age of Onset  . Cervical cancer Mother   . Parkinsonism Father   . Sleep apnea Brother   . Sleep apnea Brother   . Diabetes Brother     deceased    No Known Allergies  Current outpatient prescriptions:estradiol (ESTRACE) 0.5 MG tablet, Take 0.5 mg by mouth daily.  , Disp: , Rfl: ;  levothyroxine (SYNTHROID, LEVOTHROID) 50 MCG  tablet, Alternate as directed with 75mg  , Disp: , Rfl: ;  lisinopril (PRINIVIL,ZESTRIL) 10 MG tablet, Take 10 mg by mouth daily. Reduced to 5mg  daily, Disp: , Rfl: ;  metFORMIN (GLUCOPHAGE) 500 MG tablet, Take 2 tabs in the morning and 1 tab at night., Disp: , Rfl:  metoprolol (LOPRESSOR) 50 MG tablet, Take 50 mg by mouth 2 (two) times daily.  , Disp: , Rfl: ;  spironolactone (ALDACTONE) 25 MG tablet, Take 25 mg by mouth 2 (two) times daily. , Disp: , Rfl: ;  warfarin (COUMADIN) 7.5 MG tablet, Take 7.5 mg by mouth as directed.  , Disp: , Rfl: ;  Soy Protein-Soy Isoflavone (SOY CARE MENOPAUSE) 185-25 MG CAPS, Take 1 capsule by mouth daily.  , Disp: , Rfl:   Filed Vitals:   07/27/11 1057  Height: 5\' 6"  (1.676 m)  Weight: 140 lb (63.504 kg)    Body mass index is 22.60 kg/(m^2).       Objective:   Physical Exam  Constitutional: She is oriented to person, place, and time. She appears well-developed and well-nourished.  HENT:  Head: Normocephalic and atraumatic.  Cardiovascular: Normal rate and regular rhythm.        No carotid bruits  Pulmonary/Chest: Effort normal and breath sounds normal.  Neurological: She is alert and oriented to person, place, and time.  Skin: Skin is warm and dry.  Diagnostic studies carotid ultrasound today shows no hemodynamically significant stenosis of bilateral internal carotid arteries there is irregular heterogeneous plaque within the right common carotid artery. There were no significant changes from February 2012.    Assessment:     Carotid stenosis    Plan:     Patient does not have any acute symptoms of carotid disease. Her ultrasound today showed no significant stenosis bilaterally. I discussed these findings with her. I feel that she'll need to be followed on a yearly basis for her carotid occlusive disease. She'll continue to be medically managed for all her other issues.

## 2011-07-29 ENCOUNTER — Encounter: Payer: Self-pay | Admitting: Pulmonary Disease

## 2011-07-29 ENCOUNTER — Ambulatory Visit (INDEPENDENT_AMBULATORY_CARE_PROVIDER_SITE_OTHER): Payer: Medicare Other | Admitting: Pulmonary Disease

## 2011-07-29 VITALS — BP 110/68 | HR 58 | Temp 97.7°F | Ht 65.0 in | Wt 142.8 lb

## 2011-07-29 DIAGNOSIS — G4733 Obstructive sleep apnea (adult) (pediatric): Secondary | ICD-10-CM

## 2011-07-29 NOTE — Patient Instructions (Signed)
Will get your started on bipap as we discussed last visit. followup with me in 6weeks.

## 2011-07-29 NOTE — Progress Notes (Signed)
  Subjective:    Patient ID: CHARNELE SEMPLE, female    DOB: 09-Apr-1925, 77 y.o.   MRN: [id number]  HPI No visit.  Never received bipap from dme.    Review of Systems  Constitutional: Negative for fever and unexpected weight change.  HENT: Positive for trouble swallowing and postnasal drip. Negative for ear pain, nosebleeds, congestion, sore throat, rhinorrhea, sneezing, dental problem and sinus pressure.   Eyes: Negative for redness and itching.  Respiratory: Negative for cough, chest tightness, shortness of breath and wheezing.   Cardiovascular: Negative for palpitations and leg swelling.  Gastrointestinal: Negative for nausea and vomiting.  Genitourinary: Negative for dysuria.  Musculoskeletal: Positive for joint swelling.  Skin: Negative for rash.  Neurological: Negative for headaches.  Hematological: Bruises/bleeds easily.  Psychiatric/Behavioral: Negative for dysphoric mood. The patient is not nervous/anxious.        Objective:   Physical Exam        Assessment & Plan:

## 2011-08-11 ENCOUNTER — Telehealth: Payer: Self-pay | Admitting: Pulmonary Disease

## 2011-08-12 NOTE — Telephone Encounter (Signed)
Spoke with [facility] and they state they need the ov note prior to the sleep study in June. I advised that the pt was not a pt here in June, she saw [place] on 06-22-11 as new pt. I advsied she was referred by Dr. Annalee Genta, provided contact number for them. [facility] states they will contact them. Pt aware. Carron Curie, CMA

## 2011-08-24 NOTE — Procedures (Unsigned)
CAROTID DUPLEX EXAM  INDICATION:  Carotid disease.  HISTORY: Diabetes:  Yes. Cardiac:  No. Hypertension:  Yes. Smoking:  No. Previous Surgery:  No. CV History:  Patient complains of chronic visual issues and dizziness. Amaurosis Fugax No, Paresthesias No, Hemiparesis No.                                      RIGHT                LEFT Brachial systolic pressure:         148                  160 Brachial Doppler waveforms:         Normal               Normal Vertebral direction of flow:        Antegrade            Antegrade DUPLEX VELOCITIES (cm/sec) CCA peak systolic                   82                   69 ECA peak systolic                   146                  96 ICA peak systolic                   132                  91 ICA end diastolic                   27                   15 PLAQUE MORPHOLOGY:                  Irregular/heterogenous                 Heterogenous PLAQUE AMOUNT:                      Mild/moderate        Moderate PLAQUE LOCATION:                    ICA/ECA              ICA/ECA  IMPRESSION: 1. Doppler velocities suggest no hemodynamically significant stenoses     at the bilateral internal carotid arteries.  Plaque formations of     bilateral internal carotid arteries noted, as described above. 2. No significant change in velocities of the right internal carotid     arteries when compared to the previous examination on 11/26/2011.         ___________________________________________ V. Charlena Cross, MD  CH/MEDQ  D:  07/29/2011  T:  07/29/2011  Job:  [id number]

## 2011-09-04 LAB — GLUCOSE, CAPILLARY

## 2011-09-09 ENCOUNTER — Ambulatory Visit: Payer: Medicare Other | Admitting: Pulmonary Disease

## 2011-11-03 ENCOUNTER — Other Ambulatory Visit (HOSPITAL_BASED_OUTPATIENT_CLINIC_OR_DEPARTMENT_OTHER): Payer: Self-pay | Admitting: Internal Medicine

## 2011-11-03 ENCOUNTER — Ambulatory Visit
Admission: RE | Admit: 2011-11-03 | Discharge: 2011-11-03 | Disposition: A | Discharge status: Home / Self Care | Payer: Medicare Other | Source: Ambulatory Visit | Attending: Internal Medicine | Admitting: Internal Medicine

## 2011-11-03 ENCOUNTER — Telehealth: Payer: Self-pay | Admitting: Pulmonary Disease

## 2011-11-03 DIAGNOSIS — J189 Pneumonia, unspecified organism: Secondary | ICD-10-CM

## 2011-11-03 NOTE — Telephone Encounter (Signed)
I spoke with the pt and she states she saw her PCP and he felt she might have Pneumonia and advised her to come here to see [facility] and have a CXR. The pt has only seen [facility] for sleep issues, never pulmonary. I advised the pt that she would need to be a new patient in orer to see [facility] for pulmonary. I advised the pt to wither call her PCP back and have them do an cxr or go to urgent care for this. Carron Curie, CMA

## 2011-12-13 ENCOUNTER — Encounter (HOSPITAL_COMMUNITY): Payer: Self-pay | Admitting: *Deleted

## 2011-12-13 ENCOUNTER — Emergency Department (HOSPITAL_COMMUNITY): Payer: Medicare Other

## 2011-12-13 ENCOUNTER — Other Ambulatory Visit: Payer: Self-pay

## 2011-12-13 ENCOUNTER — Inpatient Hospital Stay (HOSPITAL_COMMUNITY)
Admission: EM | Admit: 2011-12-13 | Discharge: 2011-12-14 | DRG: 605 | Disposition: A | Discharge status: Home / Self Care | Payer: Medicare Other | Source: Ambulatory Visit | Attending: Internal Medicine | Admitting: Internal Medicine

## 2011-12-13 DIAGNOSIS — S72002A Fracture of unspecified part of neck of left femur, initial encounter for closed fracture: Secondary | ICD-10-CM | POA: Diagnosis present

## 2011-12-13 DIAGNOSIS — S7000XA Contusion of unspecified hip, initial encounter: Principal | ICD-10-CM | POA: Diagnosis present

## 2011-12-13 DIAGNOSIS — Z79899 Other long term (current) drug therapy: Secondary | ICD-10-CM

## 2011-12-13 DIAGNOSIS — N189 Chronic kidney disease, unspecified: Secondary | ICD-10-CM | POA: Diagnosis present

## 2011-12-13 DIAGNOSIS — E785 Hyperlipidemia, unspecified: Secondary | ICD-10-CM | POA: Diagnosis present

## 2011-12-13 DIAGNOSIS — Z7901 Long term (current) use of anticoagulants: Secondary | ICD-10-CM

## 2011-12-13 DIAGNOSIS — G4733 Obstructive sleep apnea (adult) (pediatric): Secondary | ICD-10-CM | POA: Diagnosis present

## 2011-12-13 DIAGNOSIS — W1809XA Striking against other object with subsequent fall, initial encounter: Secondary | ICD-10-CM | POA: Diagnosis present

## 2011-12-13 DIAGNOSIS — S72009A Fracture of unspecified part of neck of unspecified femur, initial encounter for closed fracture: Secondary | ICD-10-CM

## 2011-12-13 DIAGNOSIS — N179 Acute kidney failure, unspecified: Secondary | ICD-10-CM | POA: Diagnosis present

## 2011-12-13 DIAGNOSIS — E039 Hypothyroidism, unspecified: Secondary | ICD-10-CM | POA: Diagnosis present

## 2011-12-13 DIAGNOSIS — E119 Type 2 diabetes mellitus without complications: Secondary | ICD-10-CM | POA: Diagnosis present

## 2011-12-13 DIAGNOSIS — I129 Hypertensive chronic kidney disease with stage 1 through stage 4 chronic kidney disease, or unspecified chronic kidney disease: Secondary | ICD-10-CM | POA: Diagnosis present

## 2011-12-13 DIAGNOSIS — I4891 Unspecified atrial fibrillation: Secondary | ICD-10-CM | POA: Diagnosis present

## 2011-12-13 DIAGNOSIS — I10 Essential (primary) hypertension: Secondary | ICD-10-CM | POA: Diagnosis present

## 2011-12-13 DIAGNOSIS — Y92009 Unspecified place in unspecified non-institutional (private) residence as the place of occurrence of the external cause: Secondary | ICD-10-CM

## 2011-12-13 LAB — COMPREHENSIVE METABOLIC PANEL
ALT: 9 U/L (ref 0–35)
ALT: 8 U/L (ref 0–35)
AST: 17 U/L (ref 0–37)
AST: 14 U/L (ref 0–37)
Albumin: 3.5 g/dL (ref 3.5–5.2)
Albumin: 3.2 g/dL — ABNORMAL LOW (ref 3.5–5.2)
Alkaline Phosphatase: 85 U/L (ref 39–117)
Alkaline Phosphatase: 78 U/L (ref 39–117)
BUN: 23 mg/dL (ref 6–23)
Chloride: 102 mEq/L (ref 96–112)
Potassium: 5 mEq/L (ref 3.5–5.1)
Potassium: 3.9 mEq/L (ref 3.5–5.1)
Sodium: 135 mEq/L (ref 135–145)
Sodium: 138 mEq/L (ref 135–145)
Total Bilirubin: 0.4 mg/dL (ref 0.3–1.2)
Total Protein: 7 g/dL (ref 6.0–8.3)
Total Protein: 6.3 g/dL (ref 6.0–8.3)

## 2011-12-13 LAB — DIFFERENTIAL
Basophils Relative: 1 % (ref 0–1)
Eosinophils Absolute: 0.5 10*3/uL (ref 0.0–0.7)
Eosinophils Relative: 5 % (ref 0–5)
Lymphs Abs: 2.4 10*3/uL (ref 0.7–4.0)
Monocytes Absolute: 1.4 10*3/uL — ABNORMAL HIGH (ref 0.1–1.0)
Monocytes Relative: 15 % — ABNORMAL HIGH (ref 3–12)

## 2011-12-13 LAB — CBC
HCT: 30.1 % — ABNORMAL LOW (ref 36.0–46.0)
Hemoglobin: 10.6 g/dL — ABNORMAL LOW (ref 12.0–15.0)
MCH: 33 pg (ref 26.0–34.0)
MCHC: 34.4 g/dL (ref 30.0–36.0)
MCHC: 35.2 g/dL (ref 30.0–36.0)
MCV: 93.8 fL (ref 78.0–100.0)
Platelets: 285 10*3/uL (ref 150–400)
RBC: 3.21 MIL/uL — ABNORMAL LOW (ref 3.87–5.11)
RDW: 12.9 % (ref 11.5–15.5)
WBC: 8.2 10*3/uL (ref 4.0–10.5)

## 2011-12-13 LAB — TROPONIN I: Troponin I: <0.3 ng/mL (ref <0.30)

## 2011-12-13 LAB — GLUCOSE, CAPILLARY: Glucose-Capillary: 89 mg/dL (ref 70–99)

## 2011-12-13 LAB — HEMOGLOBIN A1C
Hgb A1c MFr Bld: 6.5 % — ABNORMAL HIGH (ref <5.7)
Mean Plasma Glucose: 140 mg/dL — ABNORMAL HIGH (ref <117)

## 2011-12-13 LAB — TYPE AND SCREEN: ABO/RH(D): O POS

## 2011-12-13 LAB — POCT I-STAT TROPONIN I: Troponin i, poc: 0 ng/mL (ref 0.00–0.08)

## 2011-12-13 LAB — URINALYSIS, ROUTINE W REFLEX MICROSCOPIC
Bilirubin Urine: NEGATIVE
Nitrite: NEGATIVE
Specific Gravity, Urine: 1.01 (ref 1.005–1.030)
Urobilinogen, UA: 1 mg/dL (ref 0.0–1.0)

## 2011-12-13 LAB — PROTIME-INR
INR: 1.52 — ABNORMAL HIGH (ref 0.00–1.49)
Prothrombin Time: 18.6 seconds — ABNORMAL HIGH (ref 11.6–15.2)
Prothrombin Time: 18.7 seconds — ABNORMAL HIGH (ref 11.6–15.2)

## 2011-12-13 LAB — ABO/RH: ABO/RH(D): O POS

## 2011-12-13 LAB — MAGNESIUM: Magnesium: 1.4 mg/dL — ABNORMAL LOW (ref 1.5–2.5)

## 2011-12-13 MED ORDER — WARFARIN SODIUM 2.5 MG PO TABS
3.7500 mg | ORAL_TABLET | ORAL | Status: AC
  Administered 2011-12-13: 3.75 mg via ORAL
  Filled 2011-12-13: qty 1

## 2011-12-13 MED ORDER — SODIUM CHLORIDE 0.9 % IV SOLN
INTRAVENOUS | Status: DC
  Administered 2011-12-14: 1000 mL via INTRAVENOUS

## 2011-12-13 MED ORDER — INSULIN ASPART 100 UNIT/ML ~~LOC~~ SOLN: 0.0000 [IU] | Freq: Every day | SUBCUTANEOUS | Status: DC

## 2011-12-13 MED ORDER — LEVOTHYROXINE SODIUM 50 MCG PO TABS: 50.0000 ug | ORAL_TABLET | ORAL | Status: DC

## 2011-12-13 MED ORDER — SODIUM CHLORIDE 0.9 % IV BOLUS (SEPSIS)
250.0000 mL | Freq: Once | INTRAVENOUS | Status: AC
  Administered 2011-12-13: 1000 mL via INTRAVENOUS

## 2011-12-13 MED ORDER — INSULIN ASPART 100 UNIT/ML ~~LOC~~ SOLN: 0.0000 [IU] | Freq: Three times a day (TID) | SUBCUTANEOUS | Status: DC

## 2011-12-13 MED ORDER — METFORMIN HCL 500 MG PO TABS
1000.0000 mg | ORAL_TABLET | Freq: Every day | ORAL | Status: DC
Start: 2011-12-14 — End: 2011-12-14
  Administered 2011-12-14: 1000 mg via ORAL
  Filled 2011-12-13 (×2): qty 2

## 2011-12-13 MED ORDER — HYDROCODONE-ACETAMINOPHEN 5-325 MG PO TABS: 1.0000 | ORAL_TABLET | ORAL | Status: DC | PRN

## 2011-12-13 MED ORDER — METOPROLOL TARTRATE 50 MG PO TABS
50.0000 mg | ORAL_TABLET | Freq: Two times a day (BID) | ORAL | Status: DC
  Filled 2011-12-13 (×3): qty 1

## 2011-12-13 MED ORDER — LISINOPRIL 5 MG PO TABS
5.0000 mg | ORAL_TABLET | Freq: Every day | ORAL | Status: DC
  Filled 2011-12-13: qty 1

## 2011-12-13 MED ORDER — METFORMIN HCL 500 MG PO TABS
500.0000 mg | ORAL_TABLET | Freq: Every day | ORAL | Status: DC
  Administered 2011-12-14: 500 mg via ORAL
  Filled 2011-12-13: qty 1

## 2011-12-13 MED ORDER — LEVOTHYROXINE SODIUM 75 MCG PO TABS
75.0000 ug | ORAL_TABLET | ORAL | Status: DC
  Administered 2011-12-14: 75 ug via ORAL
  Filled 2011-12-13: qty 1

## 2011-12-13 MED ORDER — ESTRADIOL 1 MG PO TABS
0.5000 mg | ORAL_TABLET | Freq: Every day | ORAL | Status: DC
  Administered 2011-12-14: 0.5 mg via ORAL
  Filled 2011-12-13: qty 0.5

## 2011-12-13 MED ORDER — SPIRONOLACTONE 25 MG PO TABS
25.0000 mg | ORAL_TABLET | Freq: Two times a day (BID) | ORAL | Status: DC
  Administered 2011-12-13 – 2011-12-14 (×2): 25 mg via ORAL
  Filled 2011-12-13 (×3): qty 1

## 2011-12-13 NOTE — ED Notes (Signed)
Foley cath inserted using sterile technique. Clear urine returned. Pt tolerated well

## 2011-12-13 NOTE — Progress Notes (Signed)
SOL ODOR [id number] Admit to [id number] on  12/13/2011 at  1820 Attending Provider: Leroy Sea, MD  WUJ:WJXBJ, Lenon Curt, MD, MD Consults/ Treatment Team:    [name] is a 78 y.o. female patient admitted from ED awake, alert  & orientated  X 3,  Full Code, VSS - Blood pressure 112/66, pulse 65, temperature 97.8 F (36.6 C), temperature source Oral, resp. rate 18, height 5\' 6"  (1.676 m), weight 58.5 kg (128 lb 15.5 oz), SpO2 99.00%. RA, no c/o shortness of breath, no c/o chest pain, no distress noted. Tele # [id number] applied and pt is currently running:normal sinus rhythm.   IV site WDL:  wrist right, condition patent and no redness with a transparent dsg that's clean dry and intact.  Allergies:  No Known Allergies   Past Medical History  Diagnosis Date  . OSA (obstructive sleep apnea)   . Campath-induced atrial fibrillation   . Myocardial infarct   . Diabetes mellitus   . Hearing loss   . Hypertension   . Hyperlipidemia     History:  obtained from the patient. Tobacco/alcohol: denied none  Pt orientation to unit, room and routine. Information packet given to patient/family and safety video watched.  Admission INP armband ID verified with patient/family, and in place. SR up x 2, fall risk assessment complete with Patient and family verbalizing understanding of risks associated with falls. Pt verbalizes an understanding of how to use the call bell and to call for help before getting out of bed.  Skin, clean-dry- intact with large ecchymotic area to left buttock and smaller ecchymotic area to lt hip.   No evidence of skin break down or skin tears noted on exam. no wounds    Will cont to monitor and assist as needed.  Julien Nordmann [facility], RN 12/13/2011 7:29 PM

## 2011-12-13 NOTE — Progress Notes (Signed)
ANTICOAGULATION CONSULT NOTE - Initial Consult  Pharmacy Consult for afib Indication: coumadin  No Known Allergies  Vital Signs: Temp: 98 F (36.7 C) (01/06 1118) Temp src: Oral (01/06 1118) BP: 110/53 mmHg (01/06 1712) Pulse Rate: 71  (01/06 1712)  Labs:  Basename 12/13/11 1513 12/13/11 1204  HGB -- 11.2*  HCT -- 32.6*  PLT -- 285  APTT -- --  LABPROT -- 18.6*  INR -- 1.52*  HEPARINUNFRC -- --  CREATININE -- 1.48*  CKTOTAL -- --  CKMB -- --  TROPONINI <0.30 --    Medical History: Past Medical History  Diagnosis Date  . OSA (obstructive sleep apnea)   . Campath-induced atrial fibrillation   . Myocardial infarct   . Diabetes mellitus   . Hearing loss   . Hypertension   . Hyperlipidemia     Assessment: 78 yo female here with SOB and probable femoral neck fracture noted with history of afib on coumadin PTA to continue while admitted. Current INR=1.52 and at home patient on 3.75mg  daily except she skips Sundays  Goal of Therapy:  INR 2-3   Plan:  -Will give 3.75mg  today (typically skips today) as INR is below goal.   Benny Lennert 12/13/2011,5:24 PM

## 2011-12-13 NOTE — H&P (Signed)
PCP:  Kimber Relic, MD, MD   DOA:  12/13/2011 11:11 AM  Chief Complaint:  Left hip fracture  HPI: 78 year old female with history of atrial fibrillation on coumadin, HTN, hypothyroidism, DM presented to after sustaining fall at home. Patient had x ray done in ED and it showed left femoral fracture. Ortho was called for evaluation and wanted CT to confirm the finding before they would do surgery. Patient has no complaints of chest pain, no shortness of breath, no palpitations, no abdominal pain or nausea or vomiting. No diarrhea or constipation. No blood in stool or urine. Admitted to hospitalist service for further evaluation and medical management.  Allergies: No Known Allergies  Prior to Admission medications   Medication Sig Start Date End Date Taking? Authorizing Provider  estradiol (ESTRACE) 0.5 MG tablet Take 0.5 mg by mouth daily.    Yes Historical Provider, MD  levothyroxine (SYNTHROID, LEVOTHROID) 50 MCG tablet Take 50 mcg by mouth daily. 50 mcg on Wednesday and Friday - 75 mcg on other days (generic)   Yes Historical Provider, MD  levothyroxine (SYNTHROID, LEVOTHROID) 75 MCG tablet Take 75 mcg by mouth daily. 75 mcg on Mon., Tues, Thurs., Sat., Sun.- alternate with 50 mcg (Wed.  Fri)    Yes Historical Provider, MD  lisinopril (PRINIVIL,ZESTRIL) 5 MG tablet Take 5 mg by mouth daily.     Yes Historical Provider, MD  metFORMIN (GLUCOPHAGE) 500 MG tablet Take 500-1,000 mg by mouth 2 (two) times daily with a meal. Take 2 tabs in the morning and 1 tab at night.   Yes Historical Provider, MD  metoprolol (LOPRESSOR) 50 MG tablet Take 50 mg by mouth 2 (two) times daily.    Yes Historical Provider, MD  spironolactone (ALDACTONE) 25 MG tablet Take 25 mg by mouth 2 (two) times daily.    Yes Historical Provider, MD  warfarin (COUMADIN) 7.5 MG tablet Take 3.75 mg by mouth daily. Patient takes 0.5 tablet all days except on Sunday and she takes nothing on Sunday   Yes Historical Provider, MD     Past Medical History  Diagnosis Date  . OSA (obstructive sleep apnea)   . Campath-induced atrial fibrillation   . Myocardial infarct   . Diabetes mellitus   . Hearing loss   . Hypertension   . Hyperlipidemia     Past Surgical History  Procedure Date  . Total abdominal hysterectomy   . Tonsillectomy   . Appendectomy   . Hand surgery     Social History:  reports that she has never smoked. She does not have any smokeless tobacco history on file. She reports that she does not drink alcohol. Her drug history not on file.  Family History  Problem Relation Age of Onset  . Cervical cancer Mother   . Parkinsonism Father   . Sleep apnea Brother   . Sleep apnea Brother   . Diabetes Brother     deceased    Review of Systems:  Constitutional: Denies fever, chills, diaphoresis, appetite change and fatigue.  HEENT: Denies photophobia, eye pain, redness, hearing loss, ear pain, congestion, sore throat, rhinorrhea, sneezing, mouth sores, trouble swallowing, neck pain, neck stiffness and tinnitus.   Respiratory: Denies SOB, DOE, cough, chest tightness,  and wheezing.   Cardiovascular: Denies chest pain, palpitations and leg swelling.  Gastrointestinal: Denies nausea, vomiting, abdominal pain, diarrhea, constipation, blood in stool and abdominal distention.  Genitourinary: Denies dysuria, urgency, frequency, hematuria, flank pain and difficulty urinating.  Musculoskeletal: left hip pain  Skin: Denies pallor, rash and wound.  Neurological: Denies dizziness, seizures, syncope, weakness, light-headedness, numbness and headaches.  Hematological: Denies adenopathy. Easy bruising, personal or family bleeding history  Psychiatric/Behavioral: Denies suicidal ideation, mood changes, confusion, nervousness, sleep disturbance and agitation   Physical Exam:  Filed Vitals:   12/13/11 1114 12/13/11 1118 12/13/11 1230 12/13/11 1712  BP:  141/62 123/56 110/53  Pulse:  64 64 71  Temp: 98 F  (36.7 C) 98 F (36.7 C)    TempSrc: Oral Oral    Resp:  22 16 16   SpO2:  98% 100% 99%    Constitutional: no acute distress Head: Normocephalic and atraumatic Ear: TM normal bilaterally Mouth: no erythema or exudates, MMM Eyes: PERRL, EOMI, conjunctivae normal, No scleral icterus.  Neck: Supple, Trachea midline normal ROM, No JVD, mass, thyromegaly, or carotid bruit present.  Cardiovascular: RRR, S1 normal, S2 normal, no MRG, pulses symmetric and intact bilaterally Pulmonary/Chest: CTAB, no wheezes, rales, or rhonchi Abdominal: Soft. Non-tender, non-distended, bowel sounds are normal, no masses, organomegaly, or guarding present.  GU: no CVA tenderness Musculoskeletal: No joint deformities, erythema, or stiffness, ROM full and no nontender Ext: no edema and no cyanosis, pulses palpable bilaterally (DP and PT) Hematology: no cervical, inginal, or axillary adenopathy.  Neurological: A&O x3, Strenght is normal and symmetric bilaterally, cranial nerve II-XII are grossly intact, no focal motor deficit, sensory intact to light touch bilaterally.  Skin: Warm, dry and intact. No rash, cyanosis, or clubbing.  Psychiatric: Normal mood and affect. speech and behavior is normal. Judgment and thought content normal. Cognition and memory are normal.   Labs on Admission:  Results for orders placed during the hospital encounter of 12/13/11 (from the past 48 hour(s))  CBC     Status: Abnormal   Collection Time   12/13/11 12:04 PM      Component Value Range Comment   WBC 8.2  4.0 - 10.5 (K/uL)    RBC 3.44 (*) 3.87 - 5.11 (MIL/uL)    Hemoglobin 11.2 (*) 12.0 - 15.0 (g/dL)    HCT 40.9 (*) 81.1 - 46.0 (%)    MCV 94.8  78.0 - 100.0 (fL)    MCH 32.6  26.0 - 34.0 (pg)    MCHC 34.4  30.0 - 36.0 (g/dL)    RDW 91.4  78.2 - 95.6 (%)    Platelets 285  150 - 400 (K/uL)   COMPREHENSIVE METABOLIC PANEL     Status: Abnormal   Collection Time   12/13/11 12:04 PM      Component Value Range Comment   Sodium 135   135 - 145 (mEq/L)    Potassium 5.0  3.5 - 5.1 (mEq/L)    Chloride 97  96 - 112 (mEq/L)    CO2 23  19 - 32 (mEq/L)    Glucose, Bld 216 (*) 70 - 99 (mg/dL)    BUN 25 (*) 6 - 23 (mg/dL)    Creatinine, Ser 2.13 (*) 0.50 - 1.10 (mg/dL)    Calcium 9.8  8.4 - 10.5 (mg/dL)    Total Protein 7.0  6.0 - 8.3 (g/dL)    Albumin 3.5  3.5 - 5.2 (g/dL)    AST 17  0 - 37 (U/L)    ALT 9  0 - 35 (U/L)    Alkaline Phosphatase 85  39 - 117 (U/L)    Total Bilirubin 0.6  0.3 - 1.2 (mg/dL)    GFR calc non Af Amer 31 (*) >90 (mL/min)    GFR  calc Af Amer 36 (*) >90 (mL/min)   PROTIME-INR     Status: Abnormal   Collection Time   12/13/11 12:04 PM      Component Value Range Comment   Prothrombin Time 18.6 (*) 11.6 - 15.2 (seconds)    INR 1.52 (*) 0.00 - 1.49    TYPE AND SCREEN     Status: Normal   Collection Time   12/13/11 12:05 PM      Component Value Range Comment   ABO/RH(D) O POS      Antibody Screen NEG      Sample Expiration 12/16/2011     ABO/RH     Status: Normal (Preliminary result)   Collection Time   12/13/11 12:05 PM      Component Value Range Comment   ABO/RH(D) O POS     TROPONIN I     Status: Normal   Collection Time   12/13/11  3:13 PM      Component Value Range Comment   Troponin I <0.30  <0.30 (ng/mL)   POCT I-STAT TROPONIN I     Status: Normal   Collection Time   12/13/11  3:29 PM      Component Value Range Comment   Troponin i, poc 0.00  0.00 - 0.08 (ng/mL)    Comment 3              Radiological Exams on Admission: No results found.  Assessment/Plan  Principal Problem:   *Fracture of femoral neck, left - ortho is following - Follow up CT scan to confirm the fracture  Active Problems:   Acute kidney injury - likely prerenal - provide IV fluids - continue to monitor  Atrial fibrillation - continue coumadin as per pharmacy   HTN (hypertension) - continue home medication   DM (diabetes mellitus) - CBG monitoring - sliding scale insulin - continue home  medications  Time Spent on Admission: Greater than 30 minutes  Mccall, [place] 12/13/2011, 5:32 PM

## 2011-12-13 NOTE — ED Provider Notes (Signed)
History     CSN: [id number]  Arrival date & time 12/13/11  1111   First MD Initiated Contact with Patient 12/13/11 1115      Chief Complaint  Patient presents with  . Shortness of Breath    (Consider location/radiation/quality/duration/timing/severity/associated sxs/prior treatment) HPI  Pt presents to the Emergency Department after having a fall approx 5 days ago down two stairs. She hit her left hip and her head. Since then she describes feeling weak, SOB, having a frontal headache and light headed. Pt has a history of atrial fibrillation and states that this feels a lot like that. She denies having fever, N/V/D, syncope, chest pain.   Past Medical History  Diagnosis Date  . OSA (obstructive sleep apnea)   . Campath-induced atrial fibrillation   . Myocardial infarct   . Diabetes mellitus   . Hearing loss   . Hypertension   . Hyperlipidemia     Past Surgical History  Procedure Date  . Total abdominal hysterectomy   . Tonsillectomy   . Appendectomy   . Hand surgery     Family History  Problem Relation Age of Onset  . Cervical cancer Mother   . Parkinsonism Father   . Sleep apnea Brother   . Sleep apnea Brother   . Diabetes Brother     deceased    History  Substance Use Topics  . Smoking status: Never Smoker   . Smokeless tobacco: Not on file  . Alcohol Use: No    OB History    Grav Para Term Preterm Abortions TAB SAB Ect Mult Living                  Review of Systems  All other systems reviewed and are negative.    Allergies  Review of patient's allergies indicates no known allergies.  Home Medications   Current Outpatient Rx  Name Route Sig Dispense Refill  . ESTRADIOL 0.5 MG PO TABS Oral Take 0.5 mg by mouth daily.     Marland Kitchen LEVOTHYROXINE SODIUM 50 MCG PO TABS Oral Take 50 mcg by mouth daily. 50 mcg on Wednesday and Friday - 75 mcg on other days (generic)    . LEVOTHYROXINE SODIUM 75 MCG PO TABS Oral Take 75 mcg by mouth daily. 75 mcg on Mon.,  Tues, Thurs., Sat., Sun.- alternate with 50 mcg (Wed.  Fri)     . LISINOPRIL 5 MG PO TABS Oral Take 5 mg by mouth daily.      Marland Kitchen METFORMIN HCL 500 MG PO TABS Oral Take 500-1,000 mg by mouth 2 (two) times daily with a meal. Take 2 tabs in the morning and 1 tab at night.    Marland Kitchen METOPROLOL TARTRATE 50 MG PO TABS Oral Take 50 mg by mouth 2 (two) times daily.     Marland Kitchen SPIRONOLACTONE 25 MG PO TABS Oral Take 25 mg by mouth 2 (two) times daily.     . WARFARIN SODIUM 7.5 MG PO TABS Oral Take 3.75 mg by mouth daily. Patient takes 0.5 tablet all days except on Sunday and she takes nothing on Sunday      BP 141/62  Pulse 64  Temp(Src) 98 F (36.7 C) (Oral)  Resp 22  SpO2 98%  Physical Exam  Nursing note and vitals reviewed. Constitutional: She is oriented to person, place, and time. She appears well-developed and well-nourished.  HENT:  Head: Normocephalic and atraumatic.  Eyes: Conjunctivae are normal. Pupils are equal, round, and reactive to light.  Neck:  Trachea normal, normal range of motion and full passive range of motion without pain. Neck supple.  Cardiovascular: Normal rate, regular rhythm and normal pulses.   Pulmonary/Chest: Effort normal. She has no wheezes. She has no rales. Chest wall is not dull to percussion. She exhibits no tenderness, no crepitus, no edema, no deformity and no retraction.  Abdominal: Soft. Normal appearance and bowel sounds are normal.  Musculoskeletal: Normal range of motion.       Legs: Lymphadenopathy:       Head (right side): No submental, no submandibular, no tonsillar, no preauricular, no posterior auricular and no occipital adenopathy present.       Head (left side): No submental, no submandibular, no tonsillar, no preauricular, no posterior auricular and no occipital adenopathy present.    She has no cervical adenopathy.    She has no axillary adenopathy.  Neurological: She is oriented to person, place, and time. She has normal strength. She displays no  tremor. No cranial nerve deficit or sensory deficit. She exhibits normal muscle tone.  Skin: Skin is warm, dry and intact.  Psychiatric: She has a normal mood and affect. Her speech is normal and behavior is normal. Judgment and thought content normal. Cognition and memory are normal.    ED Course  Procedures (including critical care time)  Labs Reviewed  CBC - Abnormal; Notable for the following:    RBC 3.44 (*)    Hemoglobin 11.2 (*)    HCT 32.6 (*)    All other components within normal limits  COMPREHENSIVE METABOLIC PANEL - Abnormal; Notable for the following:    Glucose, Bld 216 (*)    BUN 25 (*)    Creatinine, Ser 1.48 (*)    GFR calc non Af Amer 31 (*)    GFR calc Af Amer 36 (*)    All other components within normal limits  PROTIME-INR - Abnormal; Notable for the following:    Prothrombin Time 18.6 (*)    INR 1.52 (*)    All other components within normal limits  TYPE AND SCREEN  ABO/RH  TROPONIN I  POCT I-STAT TROPONIN I  URINALYSIS, ROUTINE W REFLEX MICROSCOPIC  I-STAT TROPONIN I   Dg Chest 2 View  12/13/2011  *RADIOLOGY REPORT*  Clinical Data: Shortness of breath.  CHEST - 2 VIEW  Comparison: 11/03/2011.  Findings: The heart remains normal in size.  The lungs are clear mildly hyperexpanded.  Diffuse osteopenia and thoracic spine degenerative changes.  IMPRESSION: Stable changes of COPD.  No acute abnormality.  Original Report Authenticated By: Darrol Angel, M.D.   Dg Hip Complete Left  12/13/2011  *RADIOLOGY REPORT*  Clinical Data: Left hip pain following a fall 2 days ago.  LEFT HIP - COMPLETE 2+ VIEW  Comparison: None.  Findings: Thin linear lucency in the medial aspect of the femoral neck on the left on the frog-leg lateral view.  There is a suggestion of minimal impaction of the femoral neck on the frontal views.  Diffuse osteopenia.  IMPRESSION: Probable nondisplaced left femoral neck fracture.  This could be confirmed with CT or MR.  Original Report Authenticated  By: Darrol Angel, M.D.   Ct Head Wo Contrast  12/13/2011  *RADIOLOGY REPORT*  Clinical Data: Syncopal episode, fall, head trauma  CT HEAD WITHOUT CONTRAST  Technique:  Contiguous axial images were obtained from the base of the skull through the vertex without contrast.  Comparison: 01/07/2011  Findings: Diffuse brain atrophy and microvascular ischemic changes in the periventricular  white matter.  No acute intracranial hemorrhage, definite acute infarction, mass lesion, focal edema, midline shift, herniation, hydrocephalus, or extra-axial fluid collection.  Cisterns patent.  No cerebellar abnormality.  Orbits are symmetric.  Sinuses and mastoid visualized are clear.  IMPRESSION: Stable atrophy and microvascular ischemic changes.  Original Report Authenticated By: Judie Petit. Ruel Favors, M.D.     1. Femoral neck fracture       MDM   Date: 12/13/2011  Rate: 64  Rhythm: normal sinus rhythm  QRS Axis: normal  Intervals: normal  ST/T Wave abnormalities: normal  Conduction Disutrbances:none  Narrative Interpretation:   Old EKG Reviewed: unchanged from March 02, 2011  Pt has a probable left femoral neck fracture. Dr. Luiz Blare the Orthopedist has requested I order a hip CT w/o contrast. He has asked me to medical admit. The patient has atrial fibrillation and is on coumadin.   Admit to Team 7, Telemetry           Dorthula Matas, [place] 12/13/11 1624

## 2011-12-13 NOTE — Progress Notes (Signed)
ORTHO Pt with l. Hip pain and no fx by CT scan.  Small hematoma around hip.  She may be WBAT w/ walker and i will see her tomorrow AM.

## 2011-12-13 NOTE — Consult Note (Signed)
Reason for Consult:l. Hip pain Referring Physician: hospitalists  [name] is an 78 y.o. female.  HPI: 78 yo female who fell at home and has had worsening l. Hip pain over past few days.  Pt presented c/o l. Hip pain with x-ray suggesting l. Hip fx and we were consulted.  I was not convinced of hip fx and ordered CT which showed no hip fx but showed small hematomsa l. Hip and pt is admitted to medicine and we are consulted.  Past Medical History  Diagnosis Date  . OSA (obstructive sleep apnea)   . Campath-induced atrial fibrillation   . Myocardial infarct   . Diabetes mellitus   . Hearing loss   . Hypertension   . Hyperlipidemia     Past Surgical History  Procedure Date  . Total abdominal hysterectomy   . Tonsillectomy   . Appendectomy   . Hand surgery     Family History  Problem Relation Age of Onset  . Cervical cancer Mother   . Parkinsonism Father   . Sleep apnea Brother   . Sleep apnea Brother   . Diabetes Brother     deceased    Social History:  reports that she has never smoked. She does not have any smokeless tobacco history on file. She reports that she does not drink alcohol. Her drug history not on file.  Allergies: No Known Allergies  Medications: I have reviewed the patient's current medications.  Results for orders placed during the hospital encounter of 12/13/11 (from the past 48 hour(s))  CBC     Status: Abnormal   Collection Time   12/13/11 12:04 PM      Component Value Range Comment   WBC 8.2  4.0 - 10.5 (K/uL)    RBC 3.44 (*) 3.87 - 5.11 (MIL/uL)    Hemoglobin 11.2 (*) 12.0 - 15.0 (g/dL)    HCT 16.1 (*) 09.6 - 46.0 (%)    MCV 94.8  78.0 - 100.0 (fL)    MCH 32.6  26.0 - 34.0 (pg)    MCHC 34.4  30.0 - 36.0 (g/dL)    RDW 04.5  40.9 - 81.1 (%)    Platelets 285  150 - 400 (K/uL)   COMPREHENSIVE METABOLIC PANEL     Status: Abnormal   Collection Time   12/13/11 12:04 PM      Component Value Range Comment   Sodium 135  135 - 145 (mEq/L)    Potassium 5.0  3.5 - 5.1 (mEq/L)    Chloride 97  96 - 112 (mEq/L)    CO2 23  19 - 32 (mEq/L)    Glucose, Bld 216 (*) 70 - 99 (mg/dL)    BUN 25 (*) 6 - 23 (mg/dL)    Creatinine, Ser 9.14 (*) 0.50 - 1.10 (mg/dL)    Calcium 9.8  8.4 - 10.5 (mg/dL)    Total Protein 7.0  6.0 - 8.3 (g/dL)    Albumin 3.5  3.5 - 5.2 (g/dL)    AST 17  0 - 37 (U/L)    ALT 9  0 - 35 (U/L)    Alkaline Phosphatase 85  39 - 117 (U/L)    Total Bilirubin 0.6  0.3 - 1.2 (mg/dL)    GFR calc non Af Amer 31 (*) >90 (mL/min)    GFR calc Af Amer 36 (*) >90 (mL/min)   PROTIME-INR     Status: Abnormal   Collection Time   12/13/11 12:04 PM  Component Value Range Comment   Prothrombin Time 18.6 (*) 11.6 - 15.2 (seconds)    INR 1.52 (*) 0.00 - 1.49    TYPE AND SCREEN     Status: Normal   Collection Time   12/13/11 12:05 PM      Component Value Range Comment   ABO/RH(D) O POS      Antibody Screen NEG      Sample Expiration 12/16/2011     ABO/RH     Status: Normal   Collection Time   12/13/11 12:05 PM      Component Value Range Comment   ABO/RH(D) O POS     TROPONIN I     Status: Normal   Collection Time   12/13/11  3:13 PM      Component Value Range Comment   Troponin I <0.30  <0.30 (ng/mL)   POCT I-STAT TROPONIN I     Status: Normal   Collection Time   12/13/11  3:29 PM      Component Value Range Comment   Troponin i, poc 0.00  0.00 - 0.08 (ng/mL)    Comment 3            URINALYSIS, ROUTINE W REFLEX MICROSCOPIC     Status: Normal   Collection Time   12/13/11  5:08 PM      Component Value Range Comment   Color, Urine YELLOW  YELLOW     APPearance CLEAR  CLEAR     Specific Gravity, Urine 1.010  1.005 - 1.030     pH 5.5  5.0 - 8.0     Glucose, UA NEGATIVE  NEGATIVE (mg/dL)    Hgb urine dipstick NEGATIVE  NEGATIVE     Bilirubin Urine NEGATIVE  NEGATIVE     Ketones, ur NEGATIVE  NEGATIVE (mg/dL)    Protein, ur NEGATIVE  NEGATIVE (mg/dL)    Urobilinogen, UA 1.0  0.0 - 1.0 (mg/dL)    Nitrite NEGATIVE  NEGATIVE      Leukocytes, UA NEGATIVE  NEGATIVE  MICROSCOPIC NOT DONE ON URINES WITH NEGATIVE PROTEIN, BLOOD, LEUKOCYTES, NITRITE, OR GLUCOSE <1000 mg/dL.  COMPREHENSIVE METABOLIC PANEL     Status: Abnormal   Collection Time   12/13/11  5:15 PM      Component Value Range Comment   Sodium 138  135 - 145 (mEq/L)    Potassium 3.9  3.5 - 5.1 (mEq/L) DELTA CHECK NOTED   Chloride 102  96 - 112 (mEq/L)    CO2 23  19 - 32 (mEq/L)    Glucose, Bld 115 (*) 70 - 99 (mg/dL)    BUN 23  6 - 23 (mg/dL)    Creatinine, Ser 1.61 (*) 0.50 - 1.10 (mg/dL)    Calcium 9.3  8.4 - 10.5 (mg/dL)    Total Protein 6.3  6.0 - 8.3 (g/dL)    Albumin 3.2 (*) 3.5 - 5.2 (g/dL)    AST 14  0 - 37 (U/L)    ALT 8  0 - 35 (U/L)    Alkaline Phosphatase 78  39 - 117 (U/L)    Total Bilirubin 0.4  0.3 - 1.2 (mg/dL)    GFR calc non Af Amer 39 (*) >90 (mL/min)    GFR calc Af Amer 45 (*) >90 (mL/min)   MAGNESIUM     Status: Abnormal   Collection Time   12/13/11  5:15 PM      Component Value Range Comment   Magnesium 1.4 (*) 1.5 - 2.5 (mg/dL)  PHOSPHORUS     Status: Normal   Collection Time   12/13/11  5:15 PM      Component Value Range Comment   Phosphorus 2.4  2.3 - 4.6 (mg/dL)   CBC     Status: Abnormal   Collection Time   12/13/11  5:15 PM      Component Value Range Comment   WBC 9.3  4.0 - 10.5 (K/uL)    RBC 3.21 (*) 3.87 - 5.11 (MIL/uL)    Hemoglobin 10.6 (*) 12.0 - 15.0 (g/dL)    HCT 16.1 (*) 09.6 - 46.0 (%)    MCV 93.8  78.0 - 100.0 (fL)    MCH 33.0  26.0 - 34.0 (pg)    MCHC 35.2  30.0 - 36.0 (g/dL)    RDW 04.5  40.9 - 81.1 (%)    Platelets 283  150 - 400 (K/uL)   DIFFERENTIAL     Status: Abnormal   Collection Time   12/13/11  5:15 PM      Component Value Range Comment   Neutrophils Relative 53  43 - 77 (%)    Neutro Abs 5.0  1.7 - 7.7 (K/uL)    Lymphocytes Relative 26  12 - 46 (%)    Lymphs Abs 2.4  0.7 - 4.0 (K/uL)    Monocytes Relative 15 (*) 3 - 12 (%)    Monocytes Absolute 1.4 (*) 0.1 - 1.0 (K/uL)    Eosinophils Relative  5  0 - 5 (%)    Eosinophils Absolute 0.5  0.0 - 0.7 (K/uL)    Basophils Relative 1  0 - 1 (%)    Basophils Absolute 0.1  0.0 - 0.1 (K/uL)   APTT     Status: Normal   Collection Time   12/13/11  5:15 PM      Component Value Range Comment   aPTT 28  24 - 37 (seconds)   PROTIME-INR     Status: Abnormal   Collection Time   12/13/11  5:15 PM      Component Value Range Comment   Prothrombin Time 18.7 (*) 11.6 - 15.2 (seconds)    INR 1.53 (*) 0.00 - 1.49      Dg Chest 2 View  12/13/2011  *RADIOLOGY REPORT*  Clinical Data: Shortness of breath.  CHEST - 2 VIEW  Comparison: 11/03/2011.  Findings: The heart remains normal in size.  The lungs are clear mildly hyperexpanded.  Diffuse osteopenia and thoracic spine degenerative changes.  IMPRESSION: Stable changes of COPD.  No acute abnormality.  Original Report Authenticated By: Darrol Angel, M.D.   Dg Hip Complete Left  12/13/2011  *RADIOLOGY REPORT*  Clinical Data: Left hip pain following a fall 2 days ago.  LEFT HIP - COMPLETE 2+ VIEW  Comparison: None.  Findings: Thin linear lucency in the medial aspect of the femoral neck on the left on the frog-leg lateral view.  There is a suggestion of minimal impaction of the femoral neck on the frontal views.  Diffuse osteopenia.  IMPRESSION: Probable nondisplaced left femoral neck fracture.  This could be confirmed with CT or MR.  Original Report Authenticated By: Darrol Angel, M.D.   Ct Head Wo Contrast  12/13/2011  *RADIOLOGY REPORT*  Clinical Data: Syncopal episode, fall, head trauma  CT HEAD WITHOUT CONTRAST  Technique:  Contiguous axial images were obtained from the base of the skull through the vertex without contrast.  Comparison: 01/07/2011  Findings: Diffuse brain atrophy and microvascular ischemic changes in the  periventricular white matter.  No acute intracranial hemorrhage, definite acute infarction, mass lesion, focal edema, midline shift, herniation, hydrocephalus, or extra-axial fluid collection.   Cisterns patent.  No cerebellar abnormality.  Orbits are symmetric.  Sinuses and mastoid visualized are clear.  IMPRESSION: Stable atrophy and microvascular ischemic changes.  Original Report Authenticated By: Judie Petit. Ruel Favors, M.D.   Ct Hip Left Wo Contrast  12/13/2011  *RADIOLOGY REPORT*  Clinical Data: Left hip pain.  CT OF THE LEFT HIP WITHOUT CONTRAST  Technique:  Multidetector CT imaging was performed according to the standard protocol. Multiplanar CT image reconstructions were also generated.  Comparison: Left hip radiographs same date.  Findings: No hip fractures identified.  The acetabulum is intact. Mild age related hip joint degenerative changes.  The left pubic rami are intact.  The pubic symphysis and left SI joint are intact.  There is a dense fluid collection in the left buttock region which is likely a soft tissue hematoma.  IMPRESSION:  1.  No CT findings for acute hip or left-sided pelvic fracture. 2.  Hematoma in the left buttock region.  Original Report Authenticated By: P. Loralie Champagne, M.D.    ROS reviewed thoroughly and no positive responses as relates to hpi EXAM Blood pressure 109/60, pulse 71, temperature 98 F (36.7 C), temperature source Oral, resp. rate 17, SpO2 99.00%. WDWN female NAD. Eyes not dilated Not using accessory muscles of respiration Alert and oriented *3 L. Leg pain on rom - heel strike +TTP over l. Hip + pain over post hip w/ ecchymosis  Assessment/Plan: 78 yo female s/p fall with l. Hip pain s/p fall with small hematoma and no fx by CT scan.  Pt may be wt bearing as tolerated and we will follow.  If she persists with pain or worsening pain we may consider MRI but feel pretty confident that there is no hip fx at this point.  wewill follow her as in patient and she may well need SNF for 7-10 days Taetum Flewellen L 12/13/2011, 6:07 PM

## 2011-12-13 NOTE — ED Notes (Signed)
Pt request to use the bedpan for BM.  She states she does not want Foley at present time.  Explained to patient that the Foley is needed for urine specimena as well as to limit movement.

## 2011-12-13 NOTE — ED Notes (Signed)
Pt reports falling down two steps on Thursday, hitting back of head and having intermittent headaches since then.

## 2011-12-13 NOTE — ED Notes (Signed)
Pt transported to CT ?

## 2011-12-13 NOTE — ED Notes (Signed)
Reports sob for several days, increases with exertion, feeling lightheaded. Denies any swelling to legs or any cp. ekg being done at triage.

## 2011-12-14 LAB — PROTIME-INR: Prothrombin Time: 21 seconds — ABNORMAL HIGH (ref 11.6–15.2)

## 2011-12-14 LAB — GLUCOSE, CAPILLARY: Glucose-Capillary: 87 mg/dL (ref 70–99)

## 2011-12-14 MED ORDER — HYDROCODONE-ACETAMINOPHEN 5-325 MG PO TABS: 1.0000 | ORAL_TABLET | ORAL | Status: AC | PRN

## 2011-12-14 MED ORDER — LISINOPRIL 5 MG PO TABS: 5.0000 mg | ORAL_TABLET | Freq: Every day | ORAL | Status: DC

## 2011-12-14 MED ORDER — WARFARIN 1.25 MG HALF TABLET
3.7500 mg | ORAL_TABLET | ORAL | Status: DC
  Administered 2011-12-14: 3.75 mg via ORAL
  Filled 2011-12-14: qty 1

## 2011-12-14 MED ORDER — METOPROLOL TARTRATE 50 MG PO TABS
50.0000 mg | ORAL_TABLET | Freq: Two times a day (BID) | ORAL | Status: DC
  Filled 2011-12-14: qty 1

## 2011-12-14 NOTE — Progress Notes (Signed)
Rn attempted to do discharge instructions with pt but unable to print forms because discharge instructions have not been completed by MD. Pa on call K.Kirby text paged to notify awaiting return call or for instructions to be completed. Next RN Lupita Leash made aware and to follow up. Family at bedside and impatiently waiting. [name] [facility]

## 2011-12-14 NOTE — Plan of Care (Signed)
Problem: Discharge Progression Outcomes Goal: Other Discharge Outcomes/Goals Outcome: Completed/Met Date Met:  12/14/11 Foley removed and pt voiding independently

## 2011-12-14 NOTE — Progress Notes (Signed)
Physical Therapy Evaluation Patient Details Name: [name] MRN: [id number] DOB: [birth year] Today's Date: 12/14/2011  Problem List:  Patient Active Problem List  Diagnoses  . OSA (obstructive sleep apnea)  . Fracture of femoral neck, left  . Acute kidney injury  . Atrial fibrillation  . HTN (hypertension)  . DM (diabetes mellitus)    Past Medical History:  Past Medical History  Diagnosis Date  . OSA (obstructive sleep apnea)   . Atrial fibrillation   . Myocardial infarct   . Diabetes mellitus   . Hearing loss   . Hypertension   . Hyperlipidemia    Past Surgical History:  Past Surgical History  Procedure Date  . Total abdominal hysterectomy   . Tonsillectomy   . Appendectomy   . Hand surgery     PT Assessment/Plan/Recommendation PT Assessment Clinical Impression Statement: Pt adm with lt hip pain.  CT - for fracture.  Hematoma present.  Pt mobilizing adequately for return home.  Ready for dc home from PT standpoint.  PT Recommendation/Assessment: Patent does not need any further PT services No Skilled PT: Patient is modified independent with all activity/mobility PT Recommendation Follow Up Recommendations: None Equipment Recommended: None recommended by PT PT Goals     PT Evaluation Precautions/Restrictions    Prior Functioning  Home Living Lives With:  (someone currently staying with patient) Type of Home: House Home Layout: One level Home Access: Stairs to enter Entrance Stairs-Rails: Right Entrance Stairs-Number of Steps: 2 Home Adaptive Equipment: Walker - four wheeled Prior Function Level of Independence: Independent with basic ADLs;Independent with transfers;Independent with homemaking with ambulation;Independent with gait Driving: Yes Vocation: Retired Financial risk analyst Arousal/Alertness: Awake/alert Overall Cognitive Status: Appears within functional limits for tasks assessed Orientation Level: Oriented X4 Sensation/Coordination     Extremity Assessment RLE Assessment RLE Assessment: Within Functional Limits LLE Assessment LLE Assessment: Within Functional Limits Mobility (including Balance) Bed Mobility Bed Mobility: Yes Supine to Sit: 6: Modified independent (Device/Increase time);HOB elevated (Comment degrees) (10 degrees) Sit to Supine - Left: 6: Modified independent (Device/Increase time);HOB elevated (comment degrees) (10 degrees) Transfers Transfers: Yes Sit to Stand: 6: Modified independent (Device/Increase time);From bed;From toilet Stand to Sit: 6: Modified independent (Device/Increase time);To bed;To toilet Ambulation/Gait Ambulation/Gait: Yes Ambulation/Gait Assistance: 6: Modified independent (Device/Increase time) Ambulation Distance (Feet): 225 Feet Assistive device: Other (Comment);None (pushing IV pole at times) Gait Pattern: Within Functional Limits Stairs: Yes Stairs Assistance: 6: Modified independent (Device/Increase time) Stair Management Technique: One rail Right Number of Stairs: 2  Height of Stairs: 6   Dynamic Standing Balance Dynamic Standing - Level of Assistance: 6: Modified independent (Device/Increase time) Exercise    End of Session PT - End of Session Activity Tolerance: Patient tolerated treatment well Patient left: in bed;with family/visitor present Nurse Communication: Mobility status for ambulation  Kallie Depolo 12/14/2011, 2:18 PM  [facility] PT [phone]

## 2011-12-14 NOTE — Discharge Summary (Signed)
Physician Discharge Summary  Patient ID: [name] MRN: [id number] DOB/AGE: [id number] 76 y.o.  Admit date: 12/13/2011 Discharge date: 12/14/2011  Admission Diagnoses: Presumed hip fracture CKD Atrial fibrillation HTN DM  Discharge Diagnoses: CT DEMONSTRATES NO FRACTURE OF THE HIP AS PER ORTHOPEDIC SURGERY. Principal Problem:  *Fracture of femoral neck, left Active Problems:  Acute kidney injury  Atrial fibrillation  HTN (hypertension)  DM (diabetes mellitus)   Discharged Condition: good  Hospital Course: Pt was admitted to telemetry.  'Dr. Luiz Blare was consulted for orthopedic surgery. A non-contrast CT of the pelvis was performed and demonstrated no hip fracture. Dr. Luiz Blare stated that the patient could bear weight on the hip as tolerated.  Pt was evaluated by PT and OT and was determined appropriate for discharge to home.  Pt is discharged to home in good condition.  Consults: orthopedic surgery  Significant Diagnostic Studies: CT pelvis  Treatments: analgesia: Vicodin  Discharge Exam: Blood pressure 120/66, pulse 79, temperature 98.1 F (36.7 C), temperature source Oral, resp. rate 18, height 5\' 6"  (1.676 m), weight 58.5 kg (128 lb 15.5 oz), SpO2 98.00%. General appearance: alert, cooperative and appears stated age Head: Normocephalic, without obvious abnormality, atraumatic Neck: no adenopathy, no carotid bruit, no JVD, supple, symmetrical, trachea midline and thyroid not enlarged, symmetric, no tenderness/mass/nodules Cardio: regular rate and rhythm, S1, S2 normal, no murmur, click, rub or gallop and no rub GI: soft, non-tender; bowel sounds normal; no masses,  no organomegaly Extremities: extremities normal, atraumatic, no cyanosis or edema and Homans sign is negative, no sign of DVT Pulses: 2+ and symmetric Skin: Skin color, texture, turgor normal. No rashes or lesions Neurologic: Grossly normal  Disposition: Home or Self Care  Discharge Orders    Future  Appointments: Provider: Department: Dept Phone: Center:   07/25/2012 11:00 AM [facility] [phone] [facility]     Future Orders Please Complete By Expires   Diet Carb Modified      Activity as tolerated - No restrictions      Call MD for:  severe uncontrolled pain      Discharge instructions      Comments:   Follow up with PCP in 2 weeks.     Current Discharge Medication List    START taking these medications   Details  HYDROcodone-acetaminophen (NORCO) 5-325 MG per tablet Take 1-2 tablets by mouth every 4 (four) hours as needed. Qty: 30 tablet, Refills: 0      CONTINUE these medications which have NOT CHANGED   Details  estradiol (ESTRACE) 0.5 MG tablet Take 0.5 mg by mouth daily.     !! levothyroxine (SYNTHROID, LEVOTHROID) 50 MCG tablet Take 50 mcg by mouth daily. 50 mcg on Wednesday and Friday - 75 mcg on other days (generic)    !! levothyroxine (SYNTHROID, LEVOTHROID) 75 MCG tablet Take 75 mcg by mouth daily. 75 mcg on Mon., Tues, Thurs., Sat., Sun.- alternate with 50 mcg (Wed.  Fri)    lisinopril (PRINIVIL,ZESTRIL) 5 MG tablet Take 5 mg by mouth daily.      metFORMIN (GLUCOPHAGE) 500 MG tablet Take 500-1,000 mg by mouth 2 (two) times daily with a meal. Take 2 tabs in the morning and 1 tab at night.    metoprolol (LOPRESSOR) 50 MG tablet Take 50 mg by mouth 2 (two) times daily.     spironolactone (ALDACTONE) 25 MG tablet Take 25 mg by mouth 2 (two) times daily.     warfarin (COUMADIN) 7.5 MG tablet Take 3.75 mg  by mouth daily. Patient takes 0.5 tablet all days except on Sunday and she takes nothing on Sunday     !! - Potential duplicate medications found. Please discuss with provider.     Follow-up Information    Follow up with GREEN, Lenon Curt, MD .         SignedGerri Lins, Lettie Czarnecki 12/14/2011, 6:32 PM

## 2011-12-14 NOTE — Plan of Care (Signed)
Problem: Discharge Progression Outcomes Goal: Other Discharge Outcomes/Goals Outcome: Completed/Met Date Met:  12/14/11 Foley removed

## 2011-12-14 NOTE — Progress Notes (Signed)
[name] BP was 100/62 HR=75 NSR on telemetry pt scheduled to get Lisinopril 5mg , Metoprolol 50mg  and  Spironolactone 25mg . Dr Thedore Mins notified and orders given to hold Metoprolol and Lisinopril for SBP less than 110 okay to give Spironolactone. Orders entered into [facility] per MD order. Julien Nordmann [facility]

## 2011-12-14 NOTE — Progress Notes (Signed)
ANTICOAGULATION CONSULT NOTE - Follow Up Consult  Pharmacy Consult for Coumadin Indication: atrial fibrillation  No Known Allergies  Patient Measurements: Height: 5\' 6"  (167.6 cm) Weight: 128 lb 15.5 oz (58.5 kg) IBW/kg (Calculated) : 59.3    Vital Signs: Temp: 97.9 F (36.6 C) (01/07 0500) Temp src: Oral (01/07 0500) BP: 100/62 mmHg (01/07 1009) Pulse Rate: 75  (01/07 1009)  Labs:  Basename 12/14/11 0615 12/13/11 1715 12/13/11 1513 12/13/11 1204  HGB -- 10.6* -- 11.2*  HCT -- 30.1* -- 32.6*  PLT -- 283 -- 285  APTT -- 28 -- --  LABPROT 21.0* 18.7* -- 18.6*  INR 1.78* 1.53* -- 1.52*  HEPARINUNFRC -- -- -- --  CREATININE -- 1.24* -- 1.48*  CKTOTAL -- -- -- --  CKMB -- -- -- --  TROPONINI -- -- <0.30 --   Estimated Creatinine Clearance: 31.2 ml/min (by C-G formula based on Cr of 1.24).   Medications:  Prescriptions prior to admission  Medication Sig Dispense Refill  . estradiol (ESTRACE) 0.5 MG tablet Take 0.5 mg by mouth daily.       Marland Kitchen levothyroxine (SYNTHROID, LEVOTHROID) 50 MCG tablet Take 50 mcg by mouth daily. 50 mcg on Wednesday and Friday - 75 mcg on other days (generic)      . levothyroxine (SYNTHROID, LEVOTHROID) 75 MCG tablet Take 75 mcg by mouth daily. 75 mcg on Mon., Tues, Thurs., Sat., Sun.- alternate with 50 mcg (Wed.  Fri)      . lisinopril (PRINIVIL,ZESTRIL) 5 MG tablet Take 5 mg by mouth daily.        . metFORMIN (GLUCOPHAGE) 500 MG tablet Take 500-1,000 mg by mouth 2 (two) times daily with a meal. Take 2 tabs in the morning and 1 tab at night.      . metoprolol (LOPRESSOR) 50 MG tablet Take 50 mg by mouth 2 (two) times daily.       Marland Kitchen spironolactone (ALDACTONE) 25 MG tablet Take 25 mg by mouth 2 (two) times daily.       Marland Kitchen warfarin (COUMADIN) 7.5 MG tablet Take 3.75 mg by mouth daily. Patient takes 0.5 tablet all days except on Sunday and she takes nothing on Sunday        Assessment:Patient on Coumadin PTA for AFib, admitted s/p fall. Hip fx  suspected, ruled out per CT. Noted L hip hematoma. Coumadin continues, INR subtherapeutic for goal 2-3, but trending up nicely. Would avoid aggressive increases in Coumadin dose given hematoma.  Goal of Therapy:  INR 2-3   Plan: 1. Resume home Coumadin regimen of 3.75mg  PO daily, except on Sundays, give no Coumadin 2. Will f/up daily INR for now.  Lavoy Bernards K.  Allena Katz, PharmD, BCPS.  Clinical Pharmacist Pager [phone]. 12/14/2011 11:41 AM

## 2011-12-14 NOTE — Progress Notes (Signed)
Subjective: Min pain l. hip   Objective: Vital signs in last 24 hours: Temp:  [97.8 F (36.6 C)-98 F (36.7 C)] 97.9 F (36.6 C) (01/07 0500) Pulse Rate:  [64-73] 73  (01/07 0500) Resp:  [16-22] 17  (01/07 0500) BP: (107-141)/(53-67) 107/67 mmHg (01/07 0500) SpO2:  [98 %-100 %] 98 % (01/07 0500) Weight:  [58.5 kg (128 lb 15.5 oz)] 128 lb 15.5 oz (58.5 kg) (01/06 1834)  Intake/Output from previous day: 01/06 0701 - 01/07 0700 In: -  Out: 850 [Urine:850] Intake/Output this shift:     Basename 12/13/11 1715 12/13/11 1204  HGB 10.6* 11.2*    Basename 12/13/11 1715 12/13/11 1204  WBC 9.3 8.2  RBC 3.21* 3.44*  HCT 30.1* 32.6*  PLT 283 285    Basename 12/13/11 1715 12/13/11 1204  NA 138 135  K 3.9 5.0  CL 102 97  CO2 23 23  BUN 23 25*  CREATININE 1.24* 1.48*  GLUCOSE 115* 216*  CALCIUM 9.3 9.8    Basename 12/14/11 0615 12/13/11 1715  LABPT -- --  INR 1.78* 1.53*    Neurologically intact ABD soft Neurovascular intact Sensation intact distally Intact pulses distally Dorsiflexion/Plantar flexion intact No cellulitis present Compartment soft  Assessment/Plan: L. Hip pain w/ hematoma thigh and no fx. oob w/ pt D/C planning home vs SNF   [name] L 12/14/2011, 8:03 AM

## 2011-12-14 NOTE — Plan of Care (Signed)
Problem: Discharge Progression Outcomes Goal: Incision without S/S infection Outcome: Not Applicable Date Met:  12/14/11 Pt does not require surgery  Goal: Discharge plan in place and appropriate Outcome: Completed/Met Date Met:  12/14/11 Pt to return home with family; no home needs

## 2011-12-14 NOTE — Progress Notes (Signed)
Pt given discharge instructions including medications to take along with a prescription for pain medication. Daughter present in the room during discharge and all questions  Both pt and daughter asked were answered. No further questions or concerns were voiced and pt given a copy of signed of instructions. Pt's IV and telemetry monitor removed prior to giving instructions by first shift staff and pt dressed and awaiting discharge.  Pt taken downstairs via wheelchair to private vechicle for discharge.

## 2011-12-15 NOTE — ED Provider Notes (Signed)
Clarification to my immediately preceding note.  This was a shared encounter with PAC Neva Seat.  Keiva Dina Y.   Gavin Pound. Oletta Lamas, MD 12/15/11 [phone]

## 2011-12-15 NOTE — ED Provider Notes (Signed)
Medical screening examination/treatment/procedure(s) were performed by non-physician practitioner and as supervising physician I was immediately available for consultation/collaboration. Pt in no acute distress, vitals reviewed. Some limitation of left hip flexion, worse with weight bear  Discussed with hospitalist and ortho, Dr. Luiz Blare.  Deep Bonawitz Y.   Gavin Pound. Audray Rumore, MD 12/15/11 1610

## 2012-05-09 ENCOUNTER — Encounter: Payer: Self-pay | Admitting: Pulmonary Disease

## 2012-05-09 ENCOUNTER — Ambulatory Visit (INDEPENDENT_AMBULATORY_CARE_PROVIDER_SITE_OTHER): Payer: Medicare Other | Admitting: Pulmonary Disease

## 2012-05-09 VITALS — BP 112/64 | HR 86 | Temp 98.5°F | Ht 65.0 in | Wt 128.2 lb

## 2012-05-09 DIAGNOSIS — G4733 Obstructive sleep apnea (adult) (pediatric): Secondary | ICD-10-CM

## 2012-05-09 NOTE — Progress Notes (Signed)
  Subjective:    Patient ID: [name], female    DOB: 01-Jan-1925, 78 y.o.   MRN: [id number]  HPI The patient comes in today for followup of her known severe obstructive sleep apnea.  At the last visit in August, the patient was prescribed bilevel, but decided against trying the device.  She has not followed up with me since that time.  In the interim, she has tried a relatives CPAP machine with nasal pillows, and thinks that she may be able to tolerate that particular mask.  She would like to try a positive pressure device with nasal pillows.   Review of Systems  Constitutional: Negative.  Negative for fever and unexpected weight change.  HENT: Negative.  Negative for ear pain, nosebleeds, congestion, sore throat, rhinorrhea, sneezing, trouble swallowing, dental problem, postnasal drip and sinus pressure.   Eyes: Negative.  Negative for redness and itching.  Respiratory: Positive for shortness of breath. Negative for cough, chest tightness and wheezing.   Cardiovascular: Positive for palpitations. Negative for leg swelling.  Gastrointestinal: Negative.  Negative for nausea and vomiting.  Genitourinary: Negative.  Negative for dysuria.  Musculoskeletal: Negative.  Negative for joint swelling.  Skin: Negative.  Negative for rash.  Neurological: Negative.  Negative for headaches.  Hematological: Negative.  Does not bruise/bleed easily.  Psychiatric/Behavioral: Negative.  Negative for dysphoric mood. The patient is not nervous/anxious.        Objective:   Physical Exam Thin female in no acute distress Nose without purulence or discharge noted Lower extremities without edema, no cyanosis Appears mildly sleepy, appropriate, moves all 4 extremities.       Assessment & Plan:

## 2012-05-09 NOTE — Assessment & Plan Note (Signed)
The patient was prescribed bilevel at her last visit in August for her known severe obstructive sleep apnea, however decided against getting the device.  She recently however tried her brothers CPAP machine with nasal pillows, and feels that mask might allow her to tolerate the bilevel device.  I am okay with this provided that she is able to keep her mouth closed during sleep to prevent pressure loss.  I have also stressed to her again the importance of treating her severe sleep apnea, which is a significant health risk for her.

## 2012-05-09 NOTE — Patient Instructions (Signed)
Will try you on bilevel with nasal pillows at low level to start with.  Please call if having issues with tolerance. followup with me in 6 weeks.

## 2012-06-24 ENCOUNTER — Encounter: Payer: Self-pay | Admitting: Pulmonary Disease

## 2012-06-24 ENCOUNTER — Ambulatory Visit (INDEPENDENT_AMBULATORY_CARE_PROVIDER_SITE_OTHER): Payer: Medicare Other | Admitting: Pulmonary Disease

## 2012-06-24 VITALS — BP 120/68 | HR 79 | Temp 98.3°F | Ht 65.0 in | Wt 125.0 lb

## 2012-06-24 DIAGNOSIS — G4733 Obstructive sleep apnea (adult) (pediatric): Secondary | ICD-10-CM

## 2012-06-24 NOTE — Progress Notes (Signed)
  Subjective:    Patient ID: [name], female    DOB: 04-Dec-1925, 78 y.o.   MRN: [id number]  HPI The patient comes in today for followup after starting on bilevel for her obstructive sleep apnea.  She's been wearing the device approximately 4 hours per night, and feels that she is well adjusted to the pressure and mask fit.  She has not seen a big change in her sleep or daytime alertness, but I have reminded her that we have yet to optimize her pressure.  She does not have any specific tolerability issues.   Review of Systems  Constitutional: Negative for fever and unexpected weight change.  HENT: Negative for ear pain, nosebleeds, congestion, sore throat, rhinorrhea, sneezing, trouble swallowing, dental problem, postnasal drip and sinus pressure.   Eyes: Negative for redness and itching.  Respiratory: Negative for cough, chest tightness, shortness of breath and wheezing.   Cardiovascular: Negative for palpitations and leg swelling.  Gastrointestinal: Negative for nausea and vomiting.  Genitourinary: Negative for dysuria.  Musculoskeletal: Negative for joint swelling.  Skin: Negative for rash.  Neurological: Negative for headaches.  Hematological: Does not bruise/bleed easily.  Psychiatric/Behavioral: Negative for dysphoric mood. The patient is not nervous/anxious.   All other systems reviewed and are negative.       Objective:   Physical Exam Thin female in no acute distress No skin breakdown or pressure necrosis from the CPAP mask Extremities without edema, no cyanosis Alert, doesn't appear to be sleepy, moves all 4 extremities.       Assessment & Plan:

## 2012-06-24 NOTE — Patient Instructions (Addendum)
Will need to optimize your pressure for you with the "auto setting", and I will let you know your pressure level.  You should see improvement in your sleep and daytime alertness after this adjustment.  If doing well, see me back in 6mos.  If you feel you are still having issues with sleep and your device, please call me.

## 2012-06-24 NOTE — Assessment & Plan Note (Signed)
The pt is wearing cpap consistently for about 4 hrs a night, and is not experiencing any intolerance issues.  She has not seen a significant change in her sleep or daytime alertness, but I have reminded her that we have yet to optimize her pressure.  I would like to set her machine on the automatic mode for the next few weeks, and will let her know the results.

## 2012-07-20 ENCOUNTER — Other Ambulatory Visit: Payer: Self-pay | Admitting: *Deleted

## 2012-07-20 DIAGNOSIS — I6529 Occlusion and stenosis of unspecified carotid artery: Secondary | ICD-10-CM

## 2012-07-25 ENCOUNTER — Other Ambulatory Visit (INDEPENDENT_AMBULATORY_CARE_PROVIDER_SITE_OTHER): Payer: Medicare Other | Admitting: *Deleted

## 2012-07-25 DIAGNOSIS — I6529 Occlusion and stenosis of unspecified carotid artery: Secondary | ICD-10-CM

## 2012-07-25 HISTORY — PX: OTHER SURGICAL HISTORY: SHX169

## 2012-10-04 HISTORY — PX: OTHER SURGICAL HISTORY: SHX169

## 2012-10-11 ENCOUNTER — Other Ambulatory Visit: Payer: Self-pay | Admitting: Internal Medicine

## 2012-10-11 DIAGNOSIS — R2681 Unsteadiness on feet: Secondary | ICD-10-CM

## 2012-10-13 ENCOUNTER — Ambulatory Visit
Admission: RE | Admit: 2012-10-13 | Discharge: 2012-10-13 | Disposition: A | Discharge status: Home / Self Care | Payer: Medicare Other | Source: Ambulatory Visit | Attending: Internal Medicine | Admitting: Internal Medicine

## 2012-10-13 DIAGNOSIS — R2681 Unsteadiness on feet: Secondary | ICD-10-CM

## 2012-10-13 MED ORDER — IOHEXOL 300 MG/ML  SOLN
75.0000 mL | Freq: Once | INTRAMUSCULAR | Status: AC | PRN
  Administered 2012-10-13: 75 mL via INTRAVENOUS

## 2012-12-28 ENCOUNTER — Ambulatory Visit: Payer: Medicare Other | Admitting: Pulmonary Disease

## 2013-01-10 ENCOUNTER — Telehealth: Payer: Self-pay | Admitting: Pulmonary Disease

## 2013-01-10 DIAGNOSIS — G4733 Obstructive sleep apnea (adult) (pediatric): Secondary | ICD-10-CM

## 2013-01-10 NOTE — Telephone Encounter (Signed)
Ok to send d/c order for cpap, but let pt know that she has severe sleep apnea and not treating is a significant health risk that puts her at risk for heart attack, stroke, diabetes, etc.

## 2013-01-10 NOTE — Telephone Encounter (Signed)
I need an DME referral placed to D/C CPAP. [name]

## 2013-01-10 NOTE — Telephone Encounter (Signed)
Called and spoke with patient and advised patient that when Dr. Shelle Iron saw her in July 2013, she told Dr. Shelle Iron that she was wearing the machine at least 4 hours a night, but that she hadn't noticed any change in her daytime sleepiness. At that time, Dr. Shelle Iron placed an order to place machine on the auto mode.  Pt stated that she called up here and cancelled her f/u appointment with Dr. Shelle Iron and thought by her cancelling the appointment that we would know that she wasn't using the device. Explained to the patient that we would not have known that and that the DME company would need a D/C order from Dr. Shelle Iron to pick up the device. Pt stated that she has been trying to use the device off and on from July to Sept but hasn't used it since. Pt stated that she just "didn't see that it helped her that much" and is requesting that Dr. Shelle Iron send an order to [facility] to p/u the device. She also added that she is getting a bill from [place] and she doesn't want to have to pay this for something that she feels like isn't helping her.  Rhonda J Cobb

## 2013-01-10 NOTE — Telephone Encounter (Signed)
Order has been sent to [facility].  

## 2013-01-10 NOTE — Telephone Encounter (Signed)
Pt is aware.  I will forward this to [facility] so they can contact DME to d/c order of CPAP.

## 2013-01-10 NOTE — Telephone Encounter (Signed)
Please advise KC thanks 

## 2013-01-31 HISTORY — PX: OTHER SURGICAL HISTORY: SHX169

## 2013-02-23 ENCOUNTER — Encounter: Payer: Self-pay | Admitting: Cardiovascular Disease

## 2013-02-23 DIAGNOSIS — Z7901 Long term (current) use of anticoagulants: Secondary | ICD-10-CM | POA: Insufficient documentation

## 2013-02-23 DIAGNOSIS — I4891 Unspecified atrial fibrillation: Secondary | ICD-10-CM

## 2013-02-24 ENCOUNTER — Other Ambulatory Visit: Payer: Self-pay | Admitting: *Deleted

## 2013-02-24 DIAGNOSIS — I6789 Other cerebrovascular disease: Secondary | ICD-10-CM

## 2013-02-24 DIAGNOSIS — E039 Hypothyroidism, unspecified: Secondary | ICD-10-CM

## 2013-02-24 DIAGNOSIS — IMO0001 Reserved for inherently not codable concepts without codable children: Secondary | ICD-10-CM

## 2013-02-24 DIAGNOSIS — I10 Essential (primary) hypertension: Secondary | ICD-10-CM

## 2013-03-03 ENCOUNTER — Other Ambulatory Visit: Payer: Self-pay | Admitting: Internal Medicine

## 2013-03-10 ENCOUNTER — Other Ambulatory Visit: Payer: Self-pay | Admitting: Internal Medicine

## 2013-04-14 ENCOUNTER — Other Ambulatory Visit: Payer: Self-pay

## 2013-04-17 ENCOUNTER — Encounter: Payer: Self-pay | Admitting: *Deleted

## 2013-04-17 ENCOUNTER — Other Ambulatory Visit: Payer: Medicare Other

## 2013-04-17 DIAGNOSIS — E039 Hypothyroidism, unspecified: Secondary | ICD-10-CM

## 2013-04-17 DIAGNOSIS — I6789 Other cerebrovascular disease: Secondary | ICD-10-CM

## 2013-04-17 DIAGNOSIS — I10 Essential (primary) hypertension: Secondary | ICD-10-CM

## 2013-04-18 ENCOUNTER — Encounter: Payer: Self-pay | Admitting: Internal Medicine

## 2013-04-18 ENCOUNTER — Ambulatory Visit (INDEPENDENT_AMBULATORY_CARE_PROVIDER_SITE_OTHER): Payer: Medicare Other | Admitting: Internal Medicine

## 2013-04-18 VITALS — BP 124/70 | HR 70 | Temp 97.5°F | Resp 18 | Ht 66.0 in | Wt 118.6 lb

## 2013-04-18 DIAGNOSIS — I679 Cerebrovascular disease, unspecified: Secondary | ICD-10-CM | POA: Insufficient documentation

## 2013-04-18 DIAGNOSIS — I4891 Unspecified atrial fibrillation: Secondary | ICD-10-CM

## 2013-04-18 DIAGNOSIS — Z7901 Long term (current) use of anticoagulants: Secondary | ICD-10-CM

## 2013-04-18 DIAGNOSIS — I6789 Other cerebrovascular disease: Secondary | ICD-10-CM

## 2013-04-18 DIAGNOSIS — I10 Essential (primary) hypertension: Secondary | ICD-10-CM

## 2013-04-18 DIAGNOSIS — E119 Type 2 diabetes mellitus without complications: Secondary | ICD-10-CM

## 2013-04-18 DIAGNOSIS — E785 Hyperlipidemia, unspecified: Secondary | ICD-10-CM

## 2013-04-18 DIAGNOSIS — E039 Hypothyroidism, unspecified: Secondary | ICD-10-CM

## 2013-04-18 LAB — LIPID PANEL
Chol/HDL Ratio: 3.5 ratio units (ref 0.0–4.4)
HDL: 52 mg/dL (ref >39)
LDL Calculated: 111 mg/dL — ABNORMAL HIGH (ref 0–99)
Triglycerides: 89 mg/dL (ref 0–149)
VLDL Cholesterol Cal: 18 mg/dL (ref 5–40)

## 2013-04-18 LAB — BASIC METABOLIC PANEL
BUN/Creatinine Ratio: 17 (ref 11–26)
BUN: 18 mg/dL (ref 8–27)
Creatinine, Ser: 1.08 mg/dL — ABNORMAL HIGH (ref 0.57–1.00)
GFR calc Af Amer: 54 mL/min/{1.73_m2} — ABNORMAL LOW (ref >59)
Sodium: 140 mmol/L (ref 134–144)

## 2013-04-18 LAB — HEMOGLOBIN A1C: Est. average glucose Bld gHb Est-mCnc: 143 mg/dL

## 2013-04-18 NOTE — Progress Notes (Signed)
Subjective:    Patient ID: [name], female    DOB: 11-29-1925, 79 y.o.   MRN: [id number]  HPI 1. DM (diabetes mellitus) : Stable. Asymptomatic.  2. Atrial fibrillation :Paroxysmal. Currently in normal sinus rhythm.  3. HTN (hypertension) :Controlled  4. Long term (current) use of anticoagulants :Taking warfarin for PAF. Currently therapeutic.  5. Other generalized ischemic cerebrovascular disease :Chronic. Unchanged.  6. Other and unspecified hyperlipidemia :Controlled  7. Unspecified hypothyroidism :Stable on current supplements      Review of Systems  Constitutional: Positive for fatigue.  Eyes: Negative.   Respiratory: Positive for shortness of breath.   Cardiovascular: Positive for palpitations. Negative for chest pain and leg swelling.  Endocrine:       History of thyroid problems and diabetes.  Genitourinary: Negative.   Musculoskeletal: Positive for arthralgias.  Skin: Negative.   Neurological: Positive for weakness.       Mild memory deficits.  Hematological: Negative.        Objective:BP 124/70  Pulse 70  Temp(Src) 97.5 F (36.4 C)  Resp 18  Ht 5\' 6"  (1.676 m)  Wt 118 lb 9.6 oz (53.797 kg)  BMI 19.15 kg/m2    Physical Exam  Constitutional: She is oriented to person, place, and time. No distress.  Frail elderly female  HENT:  Head: Normocephalic and atraumatic.  Nose: Nose normal.  Mouth/Throat: No oropharyngeal exudate.  Eyes: Conjunctivae and EOM are normal. Pupils are equal, round, and reactive to light.  Prescription lenses  Neck: Normal range of motion. Neck supple. No JVD present. No tracheal deviation present. No thyromegaly present.  Cardiovascular: Normal rate, regular rhythm, normal heart sounds and intact distal pulses.  Exam reveals no gallop and no friction rub.   No murmur heard. Pulmonary/Chest: Effort normal and breath sounds normal. No respiratory distress. She has no wheezes. She has no rales.  Abdominal: Soft. Bowel sounds are  normal. She exhibits no distension and no mass. There is no tenderness.  Musculoskeletal: She exhibits no tenderness.  Reduced extension and flexion of the right knee. Heberden's nodes of the hands. Hammer toe on the right foot.  Lymphadenopathy:    She has no cervical adenopathy.  Neurological: She is alert and oriented to person, place, and time. No cranial nerve deficit.  Skin: Skin is warm and dry. No rash noted. No erythema. No pallor.  Psychiatric: She has a normal mood and affect. Her behavior is normal. Thought content normal.     Lab report Appointment on 04/17/2013  Component Date Value Range Status  . Hemoglobin A1C 04/17/2013 6.6* 4.8 - 5.6 % Final   Comment:          Increased risk for diabetes: 5.7 - 6.4                                   Diabetes: >6.4                                   Glycemic control for adults with diabetes: <7.0  . Estimated average glucose 04/17/2013 143   Final  . TSH 04/17/2013 2.470  0.450 - 4.500 uIU/mL Final  . Glucose 04/17/2013 105* 65 - 99 mg/dL Final  . BUN 78/29/5621 18  8 - 27 mg/dL Final  . Creatinine, Ser 04/17/2013 1.08* 0.57 - 1.00 mg/dL Final  . GFR  calc non Af Amer 04/17/2013 47* >59 mL/min/1.73 Final  . GFR calc Af Amer 04/17/2013 54* >59 mL/min/1.73 Final  . BUN/Creatinine Ratio 04/17/2013 17  11 - 26 Final  . Sodium 04/17/2013 140  134 - 144 mmol/L Final  . Potassium 04/17/2013 4.8  3.5 - 5.2 mmol/L Final  . Chloride 04/17/2013 100  97 - 108 mmol/L Final  . CO2 04/17/2013 28  19 - 28 mmol/L Final  . Calcium 04/17/2013 10.1  8.6 - 10.2 mg/dL Final  . Cholesterol, Total 04/17/2013 181  100 - 199 mg/dL Final  . Triglycerides 04/17/2013 89  0 - 149 mg/dL Final  . HDL 16/09/9603 52  >39 mg/dL Final   Comment: According to ATP-III Guidelines, HDL-C >59 mg/dL is considered a                          negative risk factor for CHD.  Marland Kitchen VLDL Cholesterol Cal 04/17/2013 18  5 - 40 mg/dL Final  . LDL Calculated 04/17/2013 540* 0 - 99 mg/dL  Final  . Chol/HDL Ratio 04/17/2013 3.5  0.0 - 4.4 ratio units Final        Assessment & Plan:  1. Atrial fibrillation Currently in normal sinus rhythm  2. HTN (hypertension) Controlled  3. DM (diabetes mellitus) Adequately controlled - Basic Metabolic Panel - Hemoglobin A1c  4. Long term (current) use of anticoagulants INR therapeutic  5. Other generalized ischemic cerebrovascular disease Stable  6. Other and unspecified hyperlipidemia Controlled - Lipid Panel  7. Unspecified hypothyroidism Controlled

## 2013-04-18 NOTE — Progress Notes (Addendum)
Failed clock drawing  

## 2013-04-18 NOTE — Patient Instructions (Signed)
Continue current medications. 

## 2013-04-20 ENCOUNTER — Ambulatory Visit (INDEPENDENT_AMBULATORY_CARE_PROVIDER_SITE_OTHER): Payer: Medicare Other | Admitting: Pharmacist Clinician (PhC)/ Clinical Pharmacy Specialist

## 2013-04-20 VITALS — BP 118/58 | HR 60

## 2013-04-20 DIAGNOSIS — Z7901 Long term (current) use of anticoagulants: Secondary | ICD-10-CM

## 2013-04-20 DIAGNOSIS — I4891 Unspecified atrial fibrillation: Secondary | ICD-10-CM

## 2013-04-20 LAB — POCT INR: INR: 2.3

## 2013-04-23 DIAGNOSIS — E039 Hypothyroidism, unspecified: Secondary | ICD-10-CM | POA: Insufficient documentation

## 2013-04-27 ENCOUNTER — Other Ambulatory Visit: Payer: Self-pay | Admitting: *Deleted

## 2013-04-27 MED ORDER — WARFARIN SODIUM 7.5 MG PO TABS: 7.5000 mg | ORAL_TABLET | ORAL | Status: DC

## 2013-04-28 ENCOUNTER — Other Ambulatory Visit: Payer: Self-pay

## 2013-04-28 MED ORDER — WARFARIN SODIUM 7.5 MG PO TABS: ORAL_TABLET | ORAL | Status: DC

## 2013-04-28 NOTE — Telephone Encounter (Signed)
Needs refill

## 2013-05-16 ENCOUNTER — Ambulatory Visit (INDEPENDENT_AMBULATORY_CARE_PROVIDER_SITE_OTHER): Payer: Medicare Other | Admitting: Pharmacist Clinician (PhC)/ Clinical Pharmacy Specialist

## 2013-05-16 VITALS — BP 140/64 | HR 72

## 2013-05-16 DIAGNOSIS — I4891 Unspecified atrial fibrillation: Secondary | ICD-10-CM

## 2013-05-16 DIAGNOSIS — Z7901 Long term (current) use of anticoagulants: Secondary | ICD-10-CM

## 2013-06-13 ENCOUNTER — Ambulatory Visit (INDEPENDENT_AMBULATORY_CARE_PROVIDER_SITE_OTHER): Payer: Medicare Other | Admitting: Pharmacist Clinician (PhC)/ Clinical Pharmacy Specialist

## 2013-06-13 VITALS — BP 122/62 | HR 64

## 2013-06-13 DIAGNOSIS — I4891 Unspecified atrial fibrillation: Secondary | ICD-10-CM

## 2013-06-13 DIAGNOSIS — Z7901 Long term (current) use of anticoagulants: Secondary | ICD-10-CM

## 2013-06-24 ENCOUNTER — Other Ambulatory Visit: Payer: Self-pay | Admitting: Internal Medicine

## 2013-06-24 ENCOUNTER — Other Ambulatory Visit: Payer: Self-pay | Admitting: Adult Health

## 2013-07-13 ENCOUNTER — Ambulatory Visit (INDEPENDENT_AMBULATORY_CARE_PROVIDER_SITE_OTHER): Payer: Medicare Other | Admitting: Pharmacist Clinician (PhC)/ Clinical Pharmacy Specialist

## 2013-07-13 DIAGNOSIS — I4891 Unspecified atrial fibrillation: Secondary | ICD-10-CM

## 2013-07-13 DIAGNOSIS — Z7901 Long term (current) use of anticoagulants: Secondary | ICD-10-CM

## 2013-07-13 LAB — POCT INR: INR: 3

## 2013-08-03 ENCOUNTER — Other Ambulatory Visit: Payer: Self-pay | Admitting: Internal Medicine

## 2013-08-08 ENCOUNTER — Ambulatory Visit: Payer: Medicare Other | Admitting: Pharmacist Clinician (PhC)/ Clinical Pharmacy Specialist

## 2013-08-15 ENCOUNTER — Ambulatory Visit (INDEPENDENT_AMBULATORY_CARE_PROVIDER_SITE_OTHER): Payer: Medicare Other | Admitting: Pharmacist Clinician (PhC)/ Clinical Pharmacy Specialist

## 2013-08-15 VITALS — BP 160/72 | HR 76

## 2013-08-15 DIAGNOSIS — I4891 Unspecified atrial fibrillation: Secondary | ICD-10-CM

## 2013-08-15 DIAGNOSIS — Z7901 Long term (current) use of anticoagulants: Secondary | ICD-10-CM

## 2013-08-22 ENCOUNTER — Other Ambulatory Visit: Payer: Self-pay | Admitting: Cardiovascular Disease

## 2013-08-22 ENCOUNTER — Other Ambulatory Visit: Payer: Self-pay | Admitting: Internal Medicine

## 2013-08-22 ENCOUNTER — Encounter: Payer: Self-pay | Admitting: Internal Medicine

## 2013-08-22 ENCOUNTER — Ambulatory Visit (INDEPENDENT_AMBULATORY_CARE_PROVIDER_SITE_OTHER): Payer: Medicare Other | Admitting: Internal Medicine

## 2013-08-22 VITALS — BP 140/78 | HR 67 | Temp 98.0°F | Resp 18 | Ht 66.0 in | Wt 118.0 lb

## 2013-08-22 DIAGNOSIS — E119 Type 2 diabetes mellitus without complications: Secondary | ICD-10-CM

## 2013-08-22 DIAGNOSIS — IMO0002 Reserved for concepts with insufficient information to code with codable children: Secondary | ICD-10-CM

## 2013-08-22 DIAGNOSIS — E039 Hypothyroidism, unspecified: Secondary | ICD-10-CM

## 2013-08-22 DIAGNOSIS — R269 Unspecified abnormalities of gait and mobility: Secondary | ICD-10-CM | POA: Insufficient documentation

## 2013-08-22 DIAGNOSIS — I4891 Unspecified atrial fibrillation: Secondary | ICD-10-CM

## 2013-08-22 DIAGNOSIS — T148XXA Other injury of unspecified body region, initial encounter: Secondary | ICD-10-CM

## 2013-08-22 DIAGNOSIS — I10 Essential (primary) hypertension: Secondary | ICD-10-CM

## 2013-08-22 DIAGNOSIS — Z23 Encounter for immunization: Secondary | ICD-10-CM

## 2013-08-22 NOTE — Patient Instructions (Signed)
Continue current medications. 

## 2013-08-22 NOTE — Progress Notes (Signed)
Subjective:    Patient ID: [name], female    DOB: 01/08/1925, 79 y.o.   MRN: [id number]  HPI Larey Seat 08/19/13. Fell face down. Sustained abrasions of face, hematoma of forehead, abrasions of hands and knees. Used ice initially and then Neosporin on abrasions. Back of neck hurts a little since the fall.  Stressed by a renter cancelling a Teaching laboratory technician.  HTN (hypertension): some SBP elevations  Atrial fibrillation: stable  Unspecified hypothyroidism: needs lab check  DM (diabetes mellitus): no recent lab  Abnormality of gait: she says she is unsteady when standing  Abrasion: multiple. Involves face, hands, and knees    Current Outpatient Prescriptions on File Prior to Visit  Medication Sig Dispense Refill  . estradiol (ESTRACE) 0.5 MG tablet TAKE 1 TABLET EVERY DAY FOR HORMONES  90 tablet  3  . levothyroxine (SYNTHROID, LEVOTHROID) 50 MCG tablet Take 50 mcg by mouth daily. 50 mcg on Wednesday and Friday - 75 mcg on other days (generic)      . levothyroxine (SYNTHROID, LEVOTHROID) 75 MCG tablet TAKE 1 TABLET ON MONDAY,TUESDAY,THURSDAY,SATURDAY,SUNDAY FOR THYROID SUPPLEMENT  30 tablet  3  . lisinopril (PRINIVIL,ZESTRIL) 5 MG tablet Take 5 mg by mouth daily.        . metoprolol (LOPRESSOR) 50 MG tablet Take 50 mg by mouth 2 (two) times daily. Take one tablet twice a day for blood pressure      . spironolactone (ALDACTONE) 25 MG tablet TAKE 1 TABLET TWICE A DAY TO STRENGTHEN THE HEART AND REGULATE BLOOD PRESSURE  60 tablet  3  . warfarin (COUMADIN) 7.5 MG tablet Take 1 tablet by mouth daily or as directed by coumadin clinic.  90 tablet  2   No current facility-administered medications on file prior to visit.    Review of Systems  Constitutional: Positive for fatigue.  Eyes: Negative.   Respiratory: Positive for shortness of breath.   Cardiovascular: Positive for palpitations. Negative for chest pain and leg swelling.  Endocrine:       History of thyroid problems and  diabetes.  Genitourinary: Negative.   Musculoskeletal: Positive for arthralgias and gait problem.       Sometimes she feels like she could fall oveer when she walks through her yard.  Skin:       Recent fall and multile abrasions. Hematoma of the forehead.  Neurological: Positive for weakness.       Mild memory deficits.  Hematological: Negative.   Psychiatric/Behavioral: Negative.        Objective:BP 140/78  Pulse 67  Temp(Src) 98 F (36.7 C) (Oral)  Resp 18  Ht 5\' 6"  (1.676 m)  Wt 118 lb (53.524 kg)  BMI 19.05 kg/m2  SpO2 99%    Physical Exam  Constitutional: She is oriented to person, place, and time. No distress.  Frail elderly female  HENT:  Head: Normocephalic and atraumatic.  Nose: Nose normal.  Mouth/Throat: No oropharyngeal exudate.  Eyes: Conjunctivae and EOM are normal. Pupils are equal, round, and reactive to light.  Prescription lenses  Neck: Normal range of motion. Neck supple. No JVD present. No tracheal deviation present. No thyromegaly present.  Cardiovascular: Normal rate, regular rhythm, normal heart sounds and intact distal pulses.  Exam reveals no gallop and no friction rub.   No murmur heard. Pulmonary/Chest: Effort normal and breath sounds normal. No respiratory distress. She has no wheezes. She has no rales.  Abdominal: Soft. Bowel sounds are normal. She exhibits no distension and no mass. There is no  tenderness.  Musculoskeletal: She exhibits no tenderness.  Reduced extension and flexion of the right knee. Heberden's nodes of the hands. Hammer toe on the right foot.  Lymphadenopathy:    She has no cervical adenopathy.  Neurological: She is alert and oriented to person, place, and time. No cranial nerve deficit.  Skin: Skin is warm and dry. No rash noted. No erythema. No pallor.  Multiple abrasions and bruises. None are infected.  Psychiatric: She has a normal mood and affect. Her behavior is normal. Thought content normal.           Assessment & Plan:  HTN (hypertension) - Plan: Basic Metabolic Panel  Need for prophylactic vaccination and inoculation against influenza: vaccine given  Atrial fibrillation: stable. Anticoagulated.  Unspecified hypothyroidism - Plan: TSH  DM (diabetes mellitus) - Plan: Basic Metabolic Panel, Hemoglobin A1c  Abnormality of gait: counselled caution. She does not feel likie she needs a cne.  Abrasion: cleasns daily. These should heal with time.

## 2013-08-22 NOTE — Telephone Encounter (Signed)
Rx was sent to pharmacy electronically. 

## 2013-08-29 ENCOUNTER — Ambulatory Visit (INDEPENDENT_AMBULATORY_CARE_PROVIDER_SITE_OTHER): Payer: Medicare Other | Admitting: Pharmacist Clinician (PhC)/ Clinical Pharmacy Specialist

## 2013-08-29 VITALS — BP 156/80 | HR 60

## 2013-08-29 DIAGNOSIS — Z7901 Long term (current) use of anticoagulants: Secondary | ICD-10-CM

## 2013-08-29 DIAGNOSIS — I4891 Unspecified atrial fibrillation: Secondary | ICD-10-CM

## 2013-09-12 ENCOUNTER — Ambulatory Visit (INDEPENDENT_AMBULATORY_CARE_PROVIDER_SITE_OTHER): Payer: Medicare Other | Admitting: Pharmacist Clinician (PhC)/ Clinical Pharmacy Specialist

## 2013-09-12 VITALS — BP 156/60 | HR 68

## 2013-09-12 DIAGNOSIS — I4891 Unspecified atrial fibrillation: Secondary | ICD-10-CM

## 2013-09-12 DIAGNOSIS — Z7901 Long term (current) use of anticoagulants: Secondary | ICD-10-CM

## 2013-09-26 ENCOUNTER — Ambulatory Visit (INDEPENDENT_AMBULATORY_CARE_PROVIDER_SITE_OTHER): Payer: Medicare Other | Admitting: Pharmacist Clinician (PhC)/ Clinical Pharmacy Specialist

## 2013-09-26 VITALS — BP 130/80

## 2013-09-26 DIAGNOSIS — I4891 Unspecified atrial fibrillation: Secondary | ICD-10-CM

## 2013-09-26 DIAGNOSIS — Z7901 Long term (current) use of anticoagulants: Secondary | ICD-10-CM

## 2013-09-26 LAB — POCT INR: INR: 3

## 2013-09-27 ENCOUNTER — Ambulatory Visit: Payer: Medicare Other | Admitting: Pharmacist Clinician (PhC)/ Clinical Pharmacy Specialist

## 2013-10-03 ENCOUNTER — Other Ambulatory Visit: Payer: Self-pay | Admitting: Internal Medicine

## 2013-10-08 ENCOUNTER — Other Ambulatory Visit: Payer: Self-pay | Admitting: Internal Medicine

## 2013-10-08 ENCOUNTER — Other Ambulatory Visit: Payer: Self-pay | Admitting: Adult Health

## 2013-10-25 ENCOUNTER — Encounter: Payer: Self-pay | Admitting: Cardiovascular Disease

## 2013-10-26 ENCOUNTER — Ambulatory Visit: Payer: Medicare Other | Admitting: Cardiovascular Disease

## 2013-10-26 ENCOUNTER — Encounter: Payer: Self-pay | Admitting: Cardiovascular Disease

## 2013-10-26 ENCOUNTER — Ambulatory Visit (INDEPENDENT_AMBULATORY_CARE_PROVIDER_SITE_OTHER): Payer: Medicare Other | Admitting: Cardiovascular Disease

## 2013-10-26 ENCOUNTER — Ambulatory Visit (INDEPENDENT_AMBULATORY_CARE_PROVIDER_SITE_OTHER): Payer: Medicare Other | Admitting: Pharmacist Clinician (PhC)/ Clinical Pharmacy Specialist

## 2013-10-26 VITALS — BP 157/80 | HR 64 | Ht 65.0 in | Wt 122.0 lb

## 2013-10-26 DIAGNOSIS — Z7901 Long term (current) use of anticoagulants: Secondary | ICD-10-CM

## 2013-10-26 DIAGNOSIS — I4891 Unspecified atrial fibrillation: Secondary | ICD-10-CM

## 2013-10-26 DIAGNOSIS — I10 Essential (primary) hypertension: Secondary | ICD-10-CM

## 2013-10-26 LAB — POCT INR: INR: 2.1

## 2013-10-26 NOTE — Patient Instructions (Signed)
Your physician wants you to follow-up in: 1 year with Dr Berry. You will receive a reminder letter in the mail two months in advance. If you don't receive a letter, please call our office to schedule the follow-up appointment.  

## 2013-10-26 NOTE — Assessment & Plan Note (Signed)
Well-controlled on current medications 

## 2013-10-26 NOTE — Progress Notes (Signed)
10/26/2013 [name]   October 22, 1925  [id number]  Primary Physician GREEN, Lenon Curt, MD Primary Cardiologist: Runell Gess MD [name]   HPI:  The patient is an 79 year old, thin-appearing, widowed Caucasian female, mother of 4, grandmother to 4 grandchildren who I saw approximately a year ago. She has a history of paroxysmal atrial fibrillation with syncope in the past on Coumadin anticoagulation. She was catheterized by Dr. Lenise Herald Apr 21, 2007, revealing essentially normal coronary arteries and normal LV function. Her other problems include hypertension and insulin-dependent diabetes. She also apparently has obstructive sleep apnea but does not tolerate CPAP. Dr. Chilton Si apparently has stopped her amiodarone and she did run out of her metoprolol which resulted in recurrent symptoms; however, since she has had that refilled she has been fairly asymptomatic. Since I saw her a year ago she denies chest pain or shortness of breath. She did have episode of falling on her face which was a result of tripping and not syncope    Current Outpatient Prescriptions  Medication Sig Dispense Refill  . estradiol (ESTRACE) 0.5 MG tablet TAKE 1 TABLET EVERY DAY FOR HORMONES  90 tablet  3  . levothyroxine (SYNTHROID, LEVOTHROID) 50 MCG tablet Take 50 mcg by mouth daily. 50 mcg on Wednesday and Friday - 75 mcg on other days (generic)      . levothyroxine (SYNTHROID, LEVOTHROID) 75 MCG tablet TAKE 1 TABLET ON MONDAY,TUESDAY,THURSDAY,SATURDAY,SUNDAY FOR THYROID SUPPLEMENT  30 tablet  3  . lisinopril (PRINIVIL,ZESTRIL) 5 MG tablet Take 5 mg by mouth daily.        . metFORMIN (GLUCOPHAGE) 500 MG tablet TAKE 2 TABLETS IN THE MORNING AND 1 TABLET AT SUPPER TO CONTROL BLOOD SUGAR  90 tablet  0  . metoprolol (LOPRESSOR) 50 MG tablet Take 1 tablet (50 mg total) by mouth 2 (two) times daily.  60 tablet  1  . spironolactone (ALDACTONE) 25 MG tablet TAKE 1 TABLET TWICE A DAY TO STRENGTHEN THE  HEART AND REGULATE BLOOD PRESSURE  60 tablet  3  . warfarin (COUMADIN) 7.5 MG tablet Take 1 tablet by mouth daily or as directed by coumadin clinic.  90 tablet  2   No current facility-administered medications for this visit.    No Known Allergies  History   Social History  . Marital Status: Widowed    Spouse Name: N/A    Number of Children: Y  . Years of Education: N/A   Occupational History  . retired from [employer], [employer]    Social History Main Topics  . Smoking status: Never Smoker   . Smokeless tobacco: Not on file  . Alcohol Use: No  . Drug Use: No  . Sexual Activity: No   Other Topics Concern  . Not on file   Social History Narrative   Lives alone.     Review of Systems: General: negative for chills, fever, night sweats or weight changes.  Cardiovascular: negative for chest pain, dyspnea on exertion, edema, orthopnea, palpitations, paroxysmal nocturnal dyspnea or shortness of breath Dermatological: negative for rash Respiratory: negative for cough or wheezing Urologic: negative for hematuria Abdominal: negative for nausea, vomiting, diarrhea, bright red blood per rectum, melena, or hematemesis Neurologic: negative for visual changes, syncope, or dizziness All other systems reviewed and are otherwise negative except as noted above.    Blood pressure 157/80, pulse 64, height 5\' 5"  (1.651 m), weight 122 lb (55.339 kg).  General appearance: alert and no distress Neck: no adenopathy, no  carotid bruit, no JVD, supple, symmetrical, trachea midline and thyroid not enlarged, symmetric, no tenderness/mass/nodules Lungs: clear to auscultation bilaterally Heart: regular rate and rhythm, S1, S2 normal, no murmur, click, rub or gallop Pulses: 2+ and symmetric  EKG sinus bradycardia at 59 without ST or T wave changes  ASSESSMENT AND PLAN:   Atrial fibrillation Currently in sinus rhythm on Coumadin anticoagulation. She has not had recurrent episodes since I saw her one year  ago.  HTN (hypertension) Well-controlled on current medications      Runell Gess MD [facility], [facility] 10/26/2013 10:11 AM

## 2013-10-26 NOTE — Assessment & Plan Note (Signed)
Currently in sinus rhythm on Coumadin anticoagulation. She has not had recurrent episodes since I saw her one year ago.

## 2013-11-02 ENCOUNTER — Other Ambulatory Visit: Payer: Self-pay | Admitting: Nurse Practitioner

## 2013-11-03 ENCOUNTER — Other Ambulatory Visit: Payer: Self-pay | Admitting: Cardiovascular Disease

## 2013-11-06 NOTE — Telephone Encounter (Signed)
Rx was sent to pharmacy electronically. 

## 2013-11-21 ENCOUNTER — Ambulatory Visit (INDEPENDENT_AMBULATORY_CARE_PROVIDER_SITE_OTHER): Payer: Medicare Other | Admitting: Pharmacist Clinician (PhC)/ Clinical Pharmacy Specialist

## 2013-11-21 VITALS — BP 130/62 | HR 68

## 2013-11-21 DIAGNOSIS — I4891 Unspecified atrial fibrillation: Secondary | ICD-10-CM

## 2013-11-21 DIAGNOSIS — Z7901 Long term (current) use of anticoagulants: Secondary | ICD-10-CM

## 2013-11-21 MED ORDER — LISINOPRIL 5 MG PO TABS: 5.0000 mg | ORAL_TABLET | Freq: Every day | ORAL | Status: DC

## 2013-11-27 ENCOUNTER — Other Ambulatory Visit: Payer: Self-pay | Admitting: Internal Medicine

## 2013-11-27 ENCOUNTER — Other Ambulatory Visit: Payer: Self-pay | Admitting: *Deleted

## 2013-11-27 MED ORDER — METFORMIN HCL 500 MG PO TABS: ORAL_TABLET | ORAL | Status: DC

## 2013-11-27 MED ORDER — METOPROLOL TARTRATE 50 MG PO TABS: ORAL_TABLET | ORAL | Status: DC

## 2013-12-08 ENCOUNTER — Other Ambulatory Visit: Payer: Medicare Other

## 2013-12-08 ENCOUNTER — Encounter: Payer: Self-pay | Admitting: *Deleted

## 2013-12-08 DIAGNOSIS — E1059 Type 1 diabetes mellitus with other circulatory complications: Secondary | ICD-10-CM

## 2013-12-08 DIAGNOSIS — I4891 Unspecified atrial fibrillation: Secondary | ICD-10-CM

## 2013-12-08 DIAGNOSIS — E039 Hypothyroidism, unspecified: Secondary | ICD-10-CM

## 2013-12-09 LAB — BASIC METABOLIC PANEL
BUN/Creatinine Ratio: 20 (ref 11–26)
BUN: 19 mg/dL (ref 8–27)
CO2: 24 mmol/L (ref 18–29)
Calcium: 10 mg/dL (ref 8.6–10.2)
Chloride: 100 mmol/L (ref 97–108)
Creatinine, Ser: 0.96 mg/dL (ref 0.57–1.00)
GFR calc non Af Amer: 54 mL/min/{1.73_m2} — ABNORMAL LOW (ref >59)
GFR, EST AFRICAN AMERICAN: 62 mL/min/{1.73_m2} (ref >59)
Glucose: 92 mg/dL (ref 65–99)
Potassium: 4.4 mmol/L (ref 3.5–5.2)
Sodium: 141 mmol/L (ref 134–144)

## 2013-12-09 LAB — HEMOGLOBIN A1C
Est. average glucose Bld gHb Est-mCnc: 137 mg/dL
HEMOGLOBIN A1C: 6.4 % — AB (ref 4.8–5.6)

## 2013-12-09 LAB — TSH: TSH: 2.82 u[IU]/mL (ref 0.450–4.500)

## 2013-12-12 ENCOUNTER — Ambulatory Visit (INDEPENDENT_AMBULATORY_CARE_PROVIDER_SITE_OTHER): Payer: Medicare Other | Admitting: Internal Medicine

## 2013-12-12 ENCOUNTER — Encounter: Payer: Self-pay | Admitting: Internal Medicine

## 2013-12-12 ENCOUNTER — Ambulatory Visit (INDEPENDENT_AMBULATORY_CARE_PROVIDER_SITE_OTHER): Payer: Medicare Other | Admitting: Pharmacist Clinician (PhC)/ Clinical Pharmacy Specialist

## 2013-12-12 VITALS — BP 160/78 | HR 60

## 2013-12-12 VITALS — BP 118/76 | HR 60 | Temp 97.4°F | Resp 10 | Ht 65.0 in | Wt 121.8 lb

## 2013-12-12 DIAGNOSIS — I10 Essential (primary) hypertension: Secondary | ICD-10-CM

## 2013-12-12 DIAGNOSIS — I4891 Unspecified atrial fibrillation: Secondary | ICD-10-CM

## 2013-12-12 DIAGNOSIS — E039 Hypothyroidism, unspecified: Secondary | ICD-10-CM

## 2013-12-12 DIAGNOSIS — E119 Type 2 diabetes mellitus without complications: Secondary | ICD-10-CM

## 2013-12-12 DIAGNOSIS — Z7901 Long term (current) use of anticoagulants: Secondary | ICD-10-CM

## 2013-12-12 DIAGNOSIS — Z5181 Encounter for therapeutic drug level monitoring: Secondary | ICD-10-CM

## 2013-12-12 DIAGNOSIS — R269 Unspecified abnormalities of gait and mobility: Secondary | ICD-10-CM

## 2013-12-12 LAB — POCT INR: INR: 1.9

## 2013-12-12 NOTE — Progress Notes (Signed)
Patient ID: [name], female   DOB: [birth year], 80 y.o.   MRN: [id number]    Location:    [facility]  Place of Service:  OFFICE    No Known Allergies  Chief Complaint  Patient presents with  . Medical Managment of Chronic Issues    4 month follow-up, discuss labs (copy printed)   . Dizziness    Patient c/o dizziness off/on x 3 months or longer   . Eye Problem    Patient had a fall 6 months ago and since then patient c/o pain behind eyes  (patient did not seek medical help following fall)     HPI:  Ever since she fell 08/19/13, she feels uncomfortable on the right temple area. Has headaches. Uses low dose aspirin or AlkaSeltzer   Dizzy episodes. CT 10/11/13 showed cerebrovascular disease and cerebral atrophy.  Sometimes she feels like she might pass out.  Unsteady whe she walks. Now using a cane.  Episodes of diarrhea. No cramps.  Medications: Patient's Medications  New Prescriptions   No medications on file  Previous Medications   ESTRADIOL (ESTRACE) 0.5 MG TABLET    TAKE 1 TABLET EVERY DAY FOR HORMONES   LEVOTHYROXINE (SYNTHROID, LEVOTHROID) 50 MCG TABLET    Take 50 mcg by mouth daily. 50 mcg on Wednesday and Friday - 75 mcg on other days (generic)   LEVOTHYROXINE (SYNTHROID, LEVOTHROID) 75 MCG TABLET    TAKE 1 TABLET ON MONDAY,TUESDAY,THURSDAY,SATURDAY,SUNDAY FOR THYROID SUPPLEMENT   LISINOPRIL (PRINIVIL,ZESTRIL) 5 MG TABLET    Take 1 tablet (5 mg total) by mouth daily.   METFORMIN (GLUCOPHAGE) 500 MG TABLET    Take two tablets in the morning and take one tablet at supper to control blood sugar.   METOPROLOL (LOPRESSOR) 50 MG TABLET    Take one tablet by mouth twice daily for blood pressure   SPIRONOLACTONE (ALDACTONE) 25 MG TABLET    TAKE 1 TABLET TWICE A DAY TO STRENGTHEN THE HEART AND REGULATE BLOOD PRESSURE   WARFARIN (COUMADIN) 7.5 MG TABLET    Take 1 tablet by mouth daily or as directed by coumadin clinic.  Modified Medications   No medications on file    Discontinued Medications   SPIRONOLACTONE (ALDACTONE) 25 MG TABLET    TAKE 1 TABLET TWICE A DAY TO STRENGTHEN THE HEART AND REGULATE BLOOD PRESSURE     Review of Systems  Constitutional: Positive for fatigue.  Eyes: Negative.   Respiratory: Positive for shortness of breath.   Cardiovascular: Positive for palpitations. Negative for chest pain and leg swelling.  Gastrointestinal: Positive for diarrhea. Negative for nausea, abdominal pain and abdominal distention.       Incontinent of stool sometimes  Endocrine:       History of thyroid problems and diabetes.  Genitourinary: Negative.        Diarrhea. No cramps. No blood in stool.  Musculoskeletal: Positive for arthralgias and gait problem.       Sometimes she feels like she could fall oveer when she walks through her yard.  Skin:       Recent fall and multile abrasions. Hematoma of the forehead.  Neurological: Positive for weakness.       Mild memory deficits.  Hematological: Negative.   Psychiatric/Behavioral: Negative.     Filed Vitals:   12/12/13 1346  BP: 118/76  Pulse: 60  Temp: 97.4 F (36.3 C)  TempSrc: Oral  Resp: 10  Height: 5\' 5"  (1.651 m)  Weight: 121 lb 12.8 oz (55.248 kg)  SpO2: 99%   Physical Exam  Constitutional: She is oriented to person, place, and time. No distress.  Frail elderly female  HENT:  Head: Normocephalic and atraumatic.  Nose: Nose normal.  Mouth/Throat: No oropharyngeal exudate.  Eyes: Conjunctivae and EOM are normal. Pupils are equal, round, and reactive to light.  Prescription lenses  Neck: Normal range of motion. Neck supple. No JVD present. No tracheal deviation present. No thyromegaly present.  Cardiovascular: Normal rate, regular rhythm, normal heart sounds and intact distal pulses.  Exam reveals no gallop and no friction rub.   No murmur heard. Pulmonary/Chest: Effort normal and breath sounds normal. No respiratory distress. She has no wheezes. She has no rales.  Abdominal: Soft.  Bowel sounds are normal. She exhibits no distension and no mass. There is no tenderness.  Musculoskeletal: She exhibits no tenderness.  Reduced extension and flexion of the right knee. Heberden's nodes of the hands. Hammer toe on the right foot.  Lymphadenopathy:    She has no cervical adenopathy.  Neurological: She is alert and oriented to person, place, and time. No cranial nerve deficit.  Slow and sometimes vague responses to questions  Skin: Skin is warm and dry. No rash noted. No erythema. No pallor.  Multiple abrasions and bruises. None are infected.  Psychiatric: She has a normal mood and affect. Her behavior is normal. Thought content normal.     Labs reviewed: Anti-coag visit on 12/12/2013  Component Date Value Range Status  . INR 12/12/2013 1.9   Final  Abstract on 12/08/2013  Component Date Value Range Status  . HM Colonoscopy 06/25/2008 [facility], Dr Jim Desanctis, poylps repeat 5-7 yrs   Final  . HM Mammogram 06/06/2010 [facility]  repeat 1 year   Final  Lab on 12/08/2013  Component Date Value Range Status  . Glucose 12/08/2013 92  65 - 99 mg/dL Final  . BUN 12/08/2013 19  8 - 27 mg/dL Final  . Creatinine, Ser 12/08/2013 0.96  0.57 - 1.00 mg/dL Final  . GFR calc non Af Amer 12/08/2013 54* >59 mL/min/1.73 Final  . GFR calc Af Amer 12/08/2013 62  >59 mL/min/1.73 Final  . BUN/Creatinine Ratio 12/08/2013 20  11 - 26 Final  . Sodium 12/08/2013 141  134 - 144 mmol/L Final  . Potassium 12/08/2013 4.4  3.5 - 5.2 mmol/L Final  . Chloride 12/08/2013 100  97 - 108 mmol/L Final  . CO2 12/08/2013 24  18 - 29 mmol/L Final  . Calcium 12/08/2013 10.0  8.6 - 10.2 mg/dL Final  . Hemoglobin A1C 12/08/2013 6.4* 4.8 - 5.6 % Final   Comment:          Increased risk for diabetes: 5.7 - 6.4                                   Diabetes: >6.4                                   Glycemic control for adults with diabetes: <7.0  . Estimated average glucose 12/08/2013 137   Final  . TSH 12/08/2013  2.820  0.450 - 4.500 uIU/mL Final  Anti-coag visit on 11/21/2013  Component Date Value Range Status  . INR 11/21/2013 1.7   Final  Anti-coag visit on 10/26/2013  Component Date Value Range Status  . INR 10/26/2013 2.1  Final  Anti-coag visit on 09/26/2013  Component Date Value Range Status  . INR 09/26/2013 3   Final  Anti-coag visit on 09/12/2013  Component Date Value Range Status  . INR 09/12/2013 4.1   Final      Assessment/Plan  Atrial fibrillation: stable and rate controlled  DM (diabetes mellitus): controlled  HTN (hypertension): controlled  Unspecified hypothyroidism: compensated  Abnormality of gait: use cane or walker

## 2013-12-12 NOTE — Patient Instructions (Signed)
Continue current medications. 

## 2013-12-28 ENCOUNTER — Other Ambulatory Visit: Payer: Self-pay | Admitting: Internal Medicine

## 2014-01-09 ENCOUNTER — Ambulatory Visit (INDEPENDENT_AMBULATORY_CARE_PROVIDER_SITE_OTHER): Payer: Medicare Other | Admitting: Pharmacist Clinician (PhC)/ Clinical Pharmacy Specialist

## 2014-01-09 VITALS — BP 138/66 | HR 80

## 2014-01-09 DIAGNOSIS — Z7901 Long term (current) use of anticoagulants: Secondary | ICD-10-CM

## 2014-01-09 DIAGNOSIS — I4891 Unspecified atrial fibrillation: Secondary | ICD-10-CM

## 2014-01-09 LAB — POCT INR: INR: 2.9

## 2014-02-01 ENCOUNTER — Other Ambulatory Visit: Payer: Self-pay | Admitting: Surgery

## 2014-02-01 DIAGNOSIS — I6529 Occlusion and stenosis of unspecified carotid artery: Secondary | ICD-10-CM

## 2014-02-06 ENCOUNTER — Ambulatory Visit (INDEPENDENT_AMBULATORY_CARE_PROVIDER_SITE_OTHER): Payer: Medicare Other | Admitting: Pharmacist Clinician (PhC)/ Clinical Pharmacy Specialist

## 2014-02-06 DIAGNOSIS — Z7901 Long term (current) use of anticoagulants: Secondary | ICD-10-CM

## 2014-02-06 DIAGNOSIS — I4891 Unspecified atrial fibrillation: Secondary | ICD-10-CM

## 2014-02-06 LAB — POCT INR: INR: 2.5

## 2014-03-07 ENCOUNTER — Ambulatory Visit (INDEPENDENT_AMBULATORY_CARE_PROVIDER_SITE_OTHER): Payer: Medicare Other | Admitting: Pharmacist Clinician (PhC)/ Clinical Pharmacy Specialist

## 2014-03-07 DIAGNOSIS — Z7901 Long term (current) use of anticoagulants: Secondary | ICD-10-CM

## 2014-03-07 DIAGNOSIS — I4891 Unspecified atrial fibrillation: Secondary | ICD-10-CM

## 2014-03-07 LAB — POCT INR: INR: 1.7

## 2014-03-12 ENCOUNTER — Other Ambulatory Visit: Payer: Medicare Other

## 2014-03-13 ENCOUNTER — Other Ambulatory Visit: Payer: Medicare Other

## 2014-03-13 DIAGNOSIS — I10 Essential (primary) hypertension: Secondary | ICD-10-CM

## 2014-03-13 DIAGNOSIS — E119 Type 2 diabetes mellitus without complications: Secondary | ICD-10-CM

## 2014-03-14 ENCOUNTER — Ambulatory Visit (INDEPENDENT_AMBULATORY_CARE_PROVIDER_SITE_OTHER): Payer: Medicare Other | Admitting: Internal Medicine

## 2014-03-14 ENCOUNTER — Encounter: Payer: Self-pay | Admitting: Internal Medicine

## 2014-03-14 DIAGNOSIS — E119 Type 2 diabetes mellitus without complications: Secondary | ICD-10-CM

## 2014-03-14 DIAGNOSIS — R42 Dizziness and giddiness: Secondary | ICD-10-CM

## 2014-03-14 DIAGNOSIS — R197 Diarrhea, unspecified: Secondary | ICD-10-CM | POA: Insufficient documentation

## 2014-03-14 DIAGNOSIS — M2041 Other hammer toe(s) (acquired), right foot: Secondary | ICD-10-CM | POA: Insufficient documentation

## 2014-03-14 DIAGNOSIS — I4891 Unspecified atrial fibrillation: Secondary | ICD-10-CM

## 2014-03-14 DIAGNOSIS — M2040 Other hammer toe(s) (acquired), unspecified foot: Secondary | ICD-10-CM

## 2014-03-14 DIAGNOSIS — I6529 Occlusion and stenosis of unspecified carotid artery: Secondary | ICD-10-CM

## 2014-03-14 DIAGNOSIS — I10 Essential (primary) hypertension: Secondary | ICD-10-CM

## 2014-03-14 DIAGNOSIS — E039 Hypothyroidism, unspecified: Secondary | ICD-10-CM

## 2014-03-14 LAB — BASIC METABOLIC PANEL
BUN / CREAT RATIO: 18 (ref 11–26)
BUN: 19 mg/dL (ref 8–27)
CALCIUM: 9.5 mg/dL (ref 8.7–10.3)
CHLORIDE: 101 mmol/L (ref 97–108)
CO2: 21 mmol/L (ref 18–29)
Creatinine, Ser: 1.07 mg/dL — ABNORMAL HIGH (ref 0.57–1.00)
GFR calc Af Amer: 54 mL/min/{1.73_m2} — ABNORMAL LOW (ref >59)
GFR calc non Af Amer: 47 mL/min/{1.73_m2} — ABNORMAL LOW (ref >59)
Glucose: 86 mg/dL (ref 65–99)
POTASSIUM: 4.8 mmol/L (ref 3.5–5.2)
SODIUM: 141 mmol/L (ref 134–144)

## 2014-03-14 LAB — HEMOGLOBIN A1C
Est. average glucose Bld gHb Est-mCnc: 146 mg/dL
Hgb A1c MFr Bld: 6.7 % — ABNORMAL HIGH (ref 4.8–5.6)

## 2014-03-14 NOTE — Progress Notes (Signed)
Patient ID: [name], female   DOB: 20-Apr-1925, 80 y.o.   MRN: [id number]    Location:  [facility]   Place of Service: OFFICE    No Known Allergies  Chief Complaint  Patient presents with  . Acute Visit    watery diarrhea x 2.5 days, not eating after breakfast until 4 pm. hoarse    HPI:   Had 2 days of diarrhea. No nausea, fever, chills, or blood in the stool. Was able to eat. Resolved 03/12/14. Still a little weak.  Dizziness is improved.  DM (diabetes mellitus): controlled  HTN (hypertension): controlled  Unspecified hypothyroidism: compensated  Atrial fibrillation; rate controlled  Hammer toe of right foot: no pain, but she would like referral to podiatrist    Medications: Patient's Medications  New Prescriptions   No medications on file  Previous Medications   ESTRADIOL (ESTRACE) 0.5 MG TABLET    TAKE 1 TABLET EVERY DAY FOR HORMONES   LEVOTHYROXINE (SYNTHROID, LEVOTHROID) 75 MCG TABLET    TAKE 1 TABLET ON MONDAY,TUESDAY,THURSDAY,SATURDAY,SUNDAY FOR THYROID SUPPLEMENT   LISINOPRIL (PRINIVIL,ZESTRIL) 5 MG TABLET    Take 1 tablet (5 mg total) by mouth daily.   METFORMIN (GLUCOPHAGE) 500 MG TABLET    Take two tablets in the morning and take one tablet at supper to control blood sugar.   METOPROLOL (LOPRESSOR) 50 MG TABLET    Take one tablet by mouth twice daily for blood pressure   SPIRONOLACTONE (ALDACTONE) 25 MG TABLET    TAKE 1 TABLET TWICE A DAY TO STRENGTHEN THE HEART AND REGULATE BLOOD PRESSURE   WARFARIN (COUMADIN) 7.5 MG TABLET    Take 1 tablet by mouth daily or as directed by coumadin clinic.  Modified Medications   Modified Medication Previous Medication   LEVOTHYROXINE (SYNTHROID, LEVOTHROID) 50 MCG TABLET levothyroxine (SYNTHROID, LEVOTHROID) 50 MCG tablet      TAKE 1 TABLET ON WEDNESDAY and FRIDAY FOR THYROID SUPPLEMENT    TAKE 1 TABLET ON WEDNESDAY THROUGH FRIDAY FOR THYROID SUPPLEMENT  Discontinued Medications   AMOXICILLIN (AMOXIL) 500 MG CAPSULE     Take 500 mg by mouth 3 (three) times daily.   BENZONATATE (TESSALON) 100 MG CAPSULE    Take 100 mg by mouth 3 (three) times daily.     Review of Systems  Constitutional: Positive for fatigue.  Eyes: Negative.   Respiratory: Positive for shortness of breath.   Cardiovascular: Positive for palpitations. Negative for chest pain and leg swelling.  Gastrointestinal: Positive for diarrhea. Negative for nausea, abdominal pain and abdominal distention.       Incontinent of stool sometimes  Endocrine:       History of thyroid problems and diabetes.  Genitourinary: Negative.        Diarrhea. No cramps. No blood in stool.  Musculoskeletal: Positive for arthralgias and gait problem.       Sometimes she feels like she could fall oveer when she walks through her yard.  Skin:       Recent fall and multile abrasions. Hematoma of the forehead.  Neurological: Positive for weakness.       Mild memory deficits.  Hematological: Negative.   Psychiatric/Behavioral: Negative.     There were no vitals filed for this visit. Physical Exam  Constitutional: She is oriented to person, place, and time. No distress.  Frail elderly female  HENT:  Head: Normocephalic and atraumatic.  Nose: Nose normal.  Mouth/Throat: No oropharyngeal exudate.  Eyes: Conjunctivae and EOM are normal. Pupils are equal, round, and  reactive to light.  Prescription lenses  Neck: Normal range of motion. Neck supple. No JVD present. No tracheal deviation present. No thyromegaly present.  Chronically hoarse.  Cardiovascular: Normal rate, regular rhythm, normal heart sounds and intact distal pulses.  Exam reveals no gallop and no friction rub.   No murmur heard. Pulmonary/Chest: Effort normal and breath sounds normal. No respiratory distress. She has no wheezes. She has no rales.  Abdominal: Soft. Bowel sounds are normal. She exhibits no distension and no mass. There is no tenderness.  Musculoskeletal: She exhibits no tenderness.    Reduced extension and flexion of the right knee. Heberden's nodes of the hands. Hammer toe on the right foot 2nd toe.  Lymphadenopathy:    She has no cervical adenopathy.  Neurological: She is alert and oriented to person, place, and time. No cranial nerve deficit.  Slow and sometimes vague responses to questions  Skin: Skin is warm and dry. No rash noted. No erythema. No pallor.  Multiple abrasions and bruises. None are infected.  Psychiatric: She has a normal mood and affect. Her behavior is normal. Thought content normal.     Labs reviewed: Appointment on 03/13/2014  Component Date Value Ref Range Status  . Hemoglobin A1C 03/13/2014 6.7* 4.8 - 5.6 % Final   Comment:          Increased risk for diabetes: 5.7 - 6.4                                   Diabetes: >6.4                                   Glycemic control for adults with diabetes: <7.0  . Estimated average glucose 03/13/2014 146   Final  . Glucose 03/13/2014 86  65 - 99 mg/dL Final  . BUN 03/13/2014 19  8 - 27 mg/dL Final  . Creatinine, Ser 03/13/2014 1.07* 0.57 - 1.00 mg/dL Final  . GFR calc non Af Amer 03/13/2014 47* >59 mL/min/1.73 Final  . GFR calc Af Amer 03/13/2014 54* >59 mL/min/1.73 Final  . BUN/Creatinine Ratio 03/13/2014 18  11 - 26 Final  . Sodium 03/13/2014 141  134 - 144 mmol/L Final  . Potassium 03/13/2014 4.8  3.5 - 5.2 mmol/L Final  . Chloride 03/13/2014 101  97 - 108 mmol/L Final  . CO2 03/13/2014 21  18 - 29 mmol/L Final  . Calcium 03/13/2014 9.5  8.7 - 10.3 mg/dL Final  Anti-coag visit on 03/07/2014  Component Date Value Ref Range Status  . INR 03/07/2014 1.7   Final  Anti-coag visit on 02/06/2014  Component Date Value Ref Range Status  . INR 02/06/2014 2.5   Final  Anti-coag visit on 01/09/2014  Component Date Value Ref Range Status  . INR 01/09/2014 2.9   Final      Assessment/Plan  1. DM (diabetes mellitus) - Comprehensive metabolic panel; Future - Hemoglobin A1c; Future - Microalbumin  / creatinine urine ratio; Future  2. HTN (hypertension - Comprehensive metabolic panel; Future  3. Unspecified hypothyroidism - TSH; Future  4. Atrial fibrillation Rate controlled  5. Hammer toe of right foot - Ambulatory referral to Podiatry  6. Dizzy improved  7. Diarrhea improved

## 2014-03-14 NOTE — Patient Instructions (Addendum)
Continue current medications See podiatrist as scheduled

## 2014-03-28 ENCOUNTER — Ambulatory Visit (INDEPENDENT_AMBULATORY_CARE_PROVIDER_SITE_OTHER): Payer: Medicare Other | Admitting: Pharmacist Clinician (PhC)/ Clinical Pharmacy Specialist

## 2014-03-28 VITALS — BP 106/56 | HR 68

## 2014-03-28 DIAGNOSIS — Z7901 Long term (current) use of anticoagulants: Secondary | ICD-10-CM

## 2014-03-28 DIAGNOSIS — I4891 Unspecified atrial fibrillation: Secondary | ICD-10-CM

## 2014-03-28 LAB — POCT INR: INR: 2.6

## 2014-04-04 ENCOUNTER — Emergency Department (HOSPITAL_COMMUNITY)
Admission: EM | Admit: 2014-04-04 | Discharge: 2014-04-04 | Disposition: A | Discharge status: Home / Self Care | Payer: Medicare Other | Attending: Emergency Medicine | Admitting: Emergency Medicine

## 2014-04-04 ENCOUNTER — Emergency Department (HOSPITAL_COMMUNITY): Payer: Medicare Other

## 2014-04-04 ENCOUNTER — Encounter (HOSPITAL_COMMUNITY): Payer: Self-pay | Admitting: Emergency Medicine

## 2014-04-04 DIAGNOSIS — H9190 Unspecified hearing loss, unspecified ear: Secondary | ICD-10-CM | POA: Insufficient documentation

## 2014-04-04 DIAGNOSIS — I4891 Unspecified atrial fibrillation: Secondary | ICD-10-CM | POA: Insufficient documentation

## 2014-04-04 DIAGNOSIS — Z90710 Acquired absence of both cervix and uterus: Secondary | ICD-10-CM | POA: Insufficient documentation

## 2014-04-04 DIAGNOSIS — R1110 Vomiting, unspecified: Secondary | ICD-10-CM

## 2014-04-04 DIAGNOSIS — IMO0001 Reserved for inherently not codable concepts without codable children: Secondary | ICD-10-CM | POA: Insufficient documentation

## 2014-04-04 DIAGNOSIS — Z5189 Encounter for other specified aftercare: Secondary | ICD-10-CM | POA: Insufficient documentation

## 2014-04-04 DIAGNOSIS — R059 Cough, unspecified: Secondary | ICD-10-CM

## 2014-04-04 DIAGNOSIS — Z8659 Personal history of other mental and behavioral disorders: Secondary | ICD-10-CM | POA: Insufficient documentation

## 2014-04-04 DIAGNOSIS — Z860100 Personal history of colon polyps, unspecified: Secondary | ICD-10-CM | POA: Insufficient documentation

## 2014-04-04 DIAGNOSIS — Z872 Personal history of diseases of the skin and subcutaneous tissue: Secondary | ICD-10-CM | POA: Insufficient documentation

## 2014-04-04 DIAGNOSIS — Z9089 Acquired absence of other organs: Secondary | ICD-10-CM | POA: Insufficient documentation

## 2014-04-04 DIAGNOSIS — K219 Gastro-esophageal reflux disease without esophagitis: Secondary | ICD-10-CM | POA: Insufficient documentation

## 2014-04-04 DIAGNOSIS — E785 Hyperlipidemia, unspecified: Secondary | ICD-10-CM | POA: Insufficient documentation

## 2014-04-04 DIAGNOSIS — Z8739 Personal history of other diseases of the musculoskeletal system and connective tissue: Secondary | ICD-10-CM | POA: Insufficient documentation

## 2014-04-04 DIAGNOSIS — Z91199 Patient's noncompliance with other medical treatment and regimen due to unspecified reason: Secondary | ICD-10-CM | POA: Insufficient documentation

## 2014-04-04 DIAGNOSIS — E1165 Type 2 diabetes mellitus with hyperglycemia: Secondary | ICD-10-CM

## 2014-04-04 DIAGNOSIS — I129 Hypertensive chronic kidney disease with stage 1 through stage 4 chronic kidney disease, or unspecified chronic kidney disease: Secondary | ICD-10-CM | POA: Insufficient documentation

## 2014-04-04 DIAGNOSIS — E039 Hypothyroidism, unspecified: Secondary | ICD-10-CM | POA: Insufficient documentation

## 2014-04-04 DIAGNOSIS — I252 Old myocardial infarction: Secondary | ICD-10-CM | POA: Insufficient documentation

## 2014-04-04 DIAGNOSIS — G311 Senile degeneration of brain, not elsewhere classified: Secondary | ICD-10-CM | POA: Insufficient documentation

## 2014-04-04 DIAGNOSIS — Z7901 Long term (current) use of anticoagulants: Secondary | ICD-10-CM | POA: Insufficient documentation

## 2014-04-04 DIAGNOSIS — Z9119 Patient's noncompliance with other medical treatment and regimen: Secondary | ICD-10-CM | POA: Insufficient documentation

## 2014-04-04 DIAGNOSIS — Z9181 History of falling: Secondary | ICD-10-CM | POA: Insufficient documentation

## 2014-04-04 DIAGNOSIS — R05 Cough: Secondary | ICD-10-CM

## 2014-04-04 DIAGNOSIS — Z79899 Other long term (current) drug therapy: Secondary | ICD-10-CM | POA: Insufficient documentation

## 2014-04-04 DIAGNOSIS — N182 Chronic kidney disease, stage 2 (mild): Secondary | ICD-10-CM | POA: Insufficient documentation

## 2014-04-04 LAB — CBC
HCT: 34.3 % — ABNORMAL LOW (ref 36.0–46.0)
HEMOGLOBIN: 11.9 g/dL — AB (ref 12.0–15.0)
MCH: 33.3 pg (ref 26.0–34.0)
MCHC: 34.7 g/dL (ref 30.0–36.0)
MCV: 96.1 fL (ref 78.0–100.0)
Platelets: 277 10*3/uL (ref 150–400)
RBC: 3.57 MIL/uL — ABNORMAL LOW (ref 3.87–5.11)
RDW: 12.9 % (ref 11.5–15.5)
WBC: 7.2 10*3/uL (ref 4.0–10.5)

## 2014-04-04 LAB — I-STAT TROPONIN, ED: TROPONIN I, POC: 0 ng/mL (ref 0.00–0.08)

## 2014-04-04 LAB — URINALYSIS, ROUTINE W REFLEX MICROSCOPIC
Bilirubin Urine: NEGATIVE
Glucose, UA: NEGATIVE mg/dL
HGB URINE DIPSTICK: NEGATIVE
Ketones, ur: NEGATIVE mg/dL
Leukocytes, UA: NEGATIVE
Nitrite: NEGATIVE
PH: 6 (ref 5.0–8.0)
Protein, ur: NEGATIVE mg/dL
SPECIFIC GRAVITY, URINE: 1.014 (ref 1.005–1.030)
UROBILINOGEN UA: 0.2 mg/dL (ref 0.0–1.0)

## 2014-04-04 LAB — LIPASE, BLOOD: LIPASE: 69 U/L — AB (ref 11–59)

## 2014-04-04 LAB — COMPREHENSIVE METABOLIC PANEL
ALK PHOS: 116 U/L (ref 39–117)
ALT: 15 U/L (ref 0–35)
AST: 21 U/L (ref 0–37)
Albumin: 3.9 g/dL (ref 3.5–5.2)
BUN: 18 mg/dL (ref 6–23)
CALCIUM: 10.2 mg/dL (ref 8.4–10.5)
CO2: 24 meq/L (ref 19–32)
Chloride: 99 mEq/L (ref 96–112)
Creatinine, Ser: 0.93 mg/dL (ref 0.50–1.10)
GFR calc Af Amer: 63 mL/min — ABNORMAL LOW (ref >90)
GFR, EST NON AFRICAN AMERICAN: 54 mL/min — AB (ref >90)
GLUCOSE: 165 mg/dL — AB (ref 70–99)
POTASSIUM: 4.3 meq/L (ref 3.7–5.3)
Sodium: 138 mEq/L (ref 137–147)
Total Bilirubin: 0.4 mg/dL (ref 0.3–1.2)
Total Protein: 7.3 g/dL (ref 6.0–8.3)

## 2014-04-04 LAB — PROTIME-INR
INR: 1.63 — ABNORMAL HIGH (ref 0.00–1.49)
Prothrombin Time: 18.9 seconds — ABNORMAL HIGH (ref 11.6–15.2)

## 2014-04-04 MED ORDER — ALBUTEROL SULFATE HFA 108 (90 BASE) MCG/ACT IN AERS: 1.0000 | INHALATION_SPRAY | Freq: Four times a day (QID) | RESPIRATORY_TRACT | Status: DC | PRN

## 2014-04-04 MED ORDER — ONDANSETRON HCL 4 MG/2ML IJ SOLN
4.0000 mg | Freq: Once | INTRAMUSCULAR | Status: AC
  Administered 2014-04-04: 4 mg via INTRAVENOUS
  Filled 2014-04-04: qty 2

## 2014-04-04 MED ORDER — IPRATROPIUM-ALBUTEROL 0.5-2.5 (3) MG/3ML IN SOLN
3.0000 mL | Freq: Once | RESPIRATORY_TRACT | Status: AC
  Administered 2014-04-04: 3 mL via RESPIRATORY_TRACT
  Filled 2014-04-04: qty 3

## 2014-04-04 NOTE — Progress Notes (Signed)
  CARE MANAGEMENT ED NOTE 04/04/2014  Patient:  [name], [name]   Account Number:  [id number]  Date Initiated:  04/04/2014  Documentation initiated by:  Livia Snellen  Subjective/Objective Assessment:   Patient presents to Ed with vomiting x1 and productive cough.     Subjective/Objective Assessment Detail:   Patient with pmhx of MI and AFIB     Action/Plan:   Action/Plan Detail:   Anticipated DC Date:  04/04/2014     Status Recommendation to Physician:   Result of Recommendation:    Other ED Services  Consult Working [employer]  Other    Choice offered to / List presented to:            Status of service:  Completed, signed off  ED Comments:   ED Comments Detail:  EDCM spoke to patient and her daughter in law at bedside. Patient lives at home by herself.  Patient's son and daughter in law live a few blocks away.  Patient's daughter in law Tobey Bride [phone]. Patient has a walker, cane, and a shower with a seat in it at home.  [facility] encouraged patient to use her walker at all times when she ambulates. Patient reports, "I have fallen a few times. I use my walker when I feel like it."  Patient reports her daughter in law helps her with her meals.  Patient is able to perform her ADL's on her own.  Patient reports her home is all one level.  EDCM provided patient with  a list of home health agencies in [facility], printed information on private duty nursing, [facility] [place], and [facility].  EDCM explained to patient that with home health she may receive a viaiting RN, PT, OT, aide and Education officer, museum.  [facility] received permission from patient to make referral to [facility].  Patient and family thankful for resources.  No further EDCM needs at this time.

## 2014-04-04 NOTE — ED Provider Notes (Signed)
Medical screening examination/treatment/procedure(s) were conducted as a shared visit with non-physician practitioner(s) and myself.  I personally evaluated the patient during the encounter.   EKG Interpretation   Date/Time:  Wednesday April 04 2014 12:35:47 EDT Ventricular Rate:  76 PR Interval:  172 QRS Duration: 83 QT Interval:  379 QTC Calculation: 426 R Axis:   35 Text Interpretation:  Sinus rhythm Baseline wander in lead(s) III No  significant change since last tracing Confirmed by Winfred Leeds  MD, Zoanne Newill  [phone]) on 04/04/2014 2:26:49 PM       [name], MD 04/04/14 1652

## 2014-04-04 NOTE — ED Notes (Signed)
Pt O2 was between 97-100 while ambulating

## 2014-04-04 NOTE — ED Notes (Signed)
Pt escorted to discharge window. Pt verbalized understanding discharge instructions. In no acute distress.  

## 2014-04-04 NOTE — Discharge Instructions (Signed)
Take the prescribed medication as directed to help with shortness of breath. Your chest x-ray showed a questionable nodule-- recommended CT of chest which may be scheduled by Dr. Nyoka Cowden to be done at the [facility] imaging center. Follow-up with Dr. Nyoka Cowden-- i have attached copies of labs and imaging for his review. Return to the ED for new or worsening symptoms.

## 2014-04-04 NOTE — ED Provider Notes (Signed)
CSN: [id number]     Arrival date & time 04/04/14  1221 History   First MD Initiated Contact with Patient 04/04/14 1233     Chief Complaint  Patient presents with  . Gastrophageal Reflux  . Cough  . Emesis     (Consider location/radiation/quality/duration/timing/severity/associated sxs/prior Treatment) The history is provided by the patient and medical records.   This is an 80 year old female with past medical history significant for hypertension, diabetes, hyperlipidemia, paroxysmal atrial fibrillation on Coumadin, chronic kidney disease, coronary artery disease, prior MI, presenting to the ED for episodes of emesis this morning. Patient states she's been having a productive cough over the past several days, no associated fever or chills.  Patient states this morning she was attempting to get out of her son's car, coughed and began vomiting-- emesis was nonbloody and nonbilious. She states after vomiting she developed a burning sensation in her throat and she felt mildly short of breath.  Patient does have history of acid reflux. She denies any current chest pain or abdominal pain.  BM have been normal, last one was yesterday.  O2 sats were 100% on RA on arrival, oxygen was placed for patient comfort.  Past Medical History  Diagnosis Date  . OSA (obstructive sleep apnea)     AHI-44/hr, AHI during REM 61.8/hr, avg O2 during REM and NREM 96%  . Atrial fibrillation   . Myocardial infarct   . Diabetes mellitus   . Hearing loss   . Hypertension   . Hyperlipidemia   . Senile degeneration of brain   . Other generalized ischemic cerebrovascular disease   . Personal history of fall   . Pain in joint, shoulder region   . 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 Loss of weight   . Chronic kidney disease, stage II (mild)   . Obstructive sleep apnea (adult) (pediatric)   . Senile degeneration of brain   . Dizziness and giddiness   . Personal history of noncompliance with  medical treatment, presenting hazards to health   . Dyskinesia of esophagus   . Shortness of breath   . Chest pain, unspecified 06/19/2009  . Long term (current) use of anticoagulants   . Unspecified hereditary and idiopathic peripheral neuropathy   . Pain in joint, site unspecified   . Carpal tunnel syndrome   . Other hammer toe (acquired)   . Alopecia areata   . Dysphagia, unspecified(787.20)   . Occlusion and stenosis of carotid artery without mention of cerebral infarction   . Other symptoms involving cardiovascular system   . Other acne   . Allergic rhinitis, cause unspecified   . Type II or unspecified type diabetes mellitus without mention of complication, uncontrolled   . Varicose veins of lower extremities   . Cervicalgia   . Disturbance of salivary secretion   . Insomnia, unspecified   . Unspecified hypothyroidism   . Palpitations 715.90  . Other malaise and fatigue   . Diverticulosis of colon (without mention of hemorrhage)   . Benign neoplasm of colon   . Depressive disorder, not elsewhere classified   . Syncope and collapse   . Paroxysmal atrial fibrillation   . Hypertension    Past Surgical History  Procedure Laterality Date  . Total abdominal hysterectomy    . Tonsillectomy    . Appendectomy    . Hand surgery    . Cataract od  10/04/2012  . Cataract os  01/31/2013  . Carotid doppler  07/25/2012  Bilat internal carotid arteries demonstrate 1-395 stenoses.  . Transthoracic echocardiogram  04/24/2011    EF >55%, moderate tricuspid regurg.   Family History  Problem Relation Age of Onset  . Cervical cancer Mother   . Parkinsonism Father   . Sleep apnea Brother   . Sleep apnea Brother    History  Substance Use Topics  . Smoking status: Never Smoker   . Smokeless tobacco: Not on file  . Alcohol Use: No   OB History   Grav Para Term Preterm Abortions TAB SAB Ect Mult Living                 Review of Systems  Respiratory: Positive for cough.    Gastrointestinal: Positive for vomiting.  All other systems reviewed and are negative.     Allergies  Review of patient's allergies indicates no known allergies.  Home Medications   Prior to Admission medications   Medication Sig Start Date End Date Taking? Authorizing Provider  estradiol (ESTRACE) 0.5 MG tablet TAKE 1 TABLET EVERY DAY FOR HORMONES 08/03/13   Pricilla Larsson, NP  levothyroxine (SYNTHROID, LEVOTHROID) 50 MCG tablet TAKE 1 TABLET ON WEDNESDAY and FRIDAY FOR THYROID SUPPLEMENT 12/28/13   Estill Dooms, MD  levothyroxine (SYNTHROID, LEVOTHROID) 75 MCG tablet TAKE 1 TABLET ON MONDAY,TUESDAY,THURSDAY,SATURDAY,SUNDAY FOR THYROID SUPPLEMENT 06/24/13   Pricilla Larsson, NP  lisinopril (PRINIVIL,ZESTRIL) 5 MG tablet Take 1 tablet (5 mg total) by mouth daily. 11/21/13   Lorretta Harp, MD  metFORMIN (GLUCOPHAGE) 500 MG tablet Take two tablets in the morning and take one tablet at supper to control blood sugar. 11/27/13   Tiffany L Reed, DO  metoprolol (LOPRESSOR) 50 MG tablet Take one tablet by mouth twice daily for blood pressure 11/27/13   Tiffany L Reed, DO  spironolactone (ALDACTONE) 25 MG tablet TAKE 1 TABLET TWICE A DAY TO STRENGTHEN THE HEART AND REGULATE BLOOD PRESSURE 11/02/13   Pricilla Larsson, NP  warfarin (COUMADIN) 7.5 MG tablet Take 1 tablet by mouth daily or as directed by coumadin clinic. 04/28/13   Tommy Medal, RPH-CPP   BP 175/72  Pulse 68  Temp(Src) 98.7 F (37.1 C) (Oral)  Resp 18  SpO2 97%  Physical Exam  Nursing note and vitals reviewed. Constitutional: She is oriented to person, place, and time. She appears well-developed and well-nourished. No distress.  HENT:  Head: Normocephalic and atraumatic.  Right Ear: Tympanic membrane and ear canal normal.  Left Ear: Tympanic membrane and ear canal normal.  Nose: Nose normal.  Mouth/Throat: Uvula is midline, oropharynx is clear and moist and mucous membranes are normal. No oropharyngeal exudate, posterior  oropharyngeal edema, posterior oropharyngeal erythema or tonsillar abscesses.  Postnasal drip noted oropharynx; airway widely patent  Eyes: Conjunctivae and EOM are normal. Pupils are equal, round, and reactive to light.  Neck: Normal range of motion. Neck supple.  Cardiovascular: Normal rate, regular rhythm and normal heart sounds.   Pulmonary/Chest: Effort normal. No accessory muscle usage. Not tachypneic. No respiratory distress. She has no decreased breath sounds. She has wheezes. She has no rhonchi.  Normal work of breathing without accessory muscle use, slight wheeze left upper lobe; speaking in full complete sentences without difficulty  Abdominal: Soft. Bowel sounds are normal. There is no tenderness. There is no guarding.  Musculoskeletal: Normal range of motion.  Neurological: She is alert and oriented to person, place, and time.  Skin: Skin is warm and dry. She is not diaphoretic.  Psychiatric: She has a  normal mood and affect.    ED Course  Procedures (including critical care time) Labs Review Labs Reviewed  CBC - Abnormal; Notable for the following:    RBC 3.57 (*)    Hemoglobin 11.9 (*)    HCT 34.3 (*)    All other components within normal limits  URINALYSIS, ROUTINE W REFLEX MICROSCOPIC - Abnormal; Notable for the following:    APPearance CLOUDY (*)    All other components within normal limits  LIPASE, BLOOD - Abnormal; Notable for the following:    Lipase 69 (*)    All other components within normal limits  COMPREHENSIVE METABOLIC PANEL - Abnormal; Notable for the following:    Glucose, Bld 165 (*)    GFR calc non Af Amer 54 (*)    GFR calc Af Amer 63 (*)    All other components within normal limits  PROTIME-INR - Abnormal; Notable for the following:    Prothrombin Time 18.9 (*)    INR 1.63 (*)    All other components within normal limits  I-STAT TROPOININ, ED    Imaging Review Dg Chest 2 View  04/04/2014   CLINICAL DATA:  Coughing congestion. Emesis.  Gastroesophageal reflux.  EXAM: CHEST  2 VIEW  COMPARISON:  12/13/2011  FINDINGS: The cardiomediastinal silhouette is within normal limits. The lungs remain hyperinflated. There is a 9 mm nodular density projecting in the right upper lung on the second AP radiograph. This is not clearly identified on prior studies. Right greater than left apical scarring is again seen. No confluent air space opacification, edema, pleural effusion, or pneumothorax is identified. No acute osseous abnormality is seen.  IMPRESSION: 1. No evidence of acute cardiopulmonary process. 2. COPD with possible new 9 mm right upper lobe lung nodule. Nonemergent chest CT could be performed for further evaluation.   Electronically Signed   By: Logan Bores   On: 04/04/2014 14:20     EKG Interpretation   Date/Time:  Wednesday April 04 2014 12:35:47 EDT Ventricular Rate:  76 PR Interval:  172 QRS Duration: 83 QT Interval:  379 QTC Calculation: 426 R Axis:   35 Text Interpretation:  Sinus rhythm Baseline wander in lead(s) III No  significant change since last tracing Confirmed by Winfred Leeds  MD, SAM  [phone]) on 04/04/2014 2:26:49 PM      MDM   Final diagnoses:  Vomiting   EKG sinus rhythm, no acute ischemic changes.  Trop negative.  CXR with finding of COPD and questionable nodule-- recommended non-emergent CT chest.  Labs reassuring-- INR slightly subtherapeutic-- discussed with pts PCP, Dr. Nyoka Cowden, does not recommend increasing her coumadin dose at his time.  Pt denies any recent chest pain, abdominal pain, palpitations, dizziness, or weakness.  Pt does have slight wheeze to LUL on exam but no signs of respiratory distress.  Will give duoneb and reassess.  After neb pts lungs are clear.  She was ambulated and maintained O2 sats of 97-100% without signs of respiratory distress.  She continues to saturate well on RA.  At this time i have low suspicion for ACS, PE, dissection, or other acute cardiac event.  Pt will be discharged  home with albuterol inhaler.  FU with PCP for re-check of INR and to scheduled OP chest CT.  Discussed plan with patient, he/she acknowledged understanding and agreed with plan of care.  Return precautions given for new or worsening symptoms.  Discussed with Dr. Winfred Leeds who personally evaluated pt and agrees with assessment and plan of care.  Larene Pickett, PA-C 04/04/14 1541

## 2014-04-04 NOTE — ED Provider Notes (Signed)
Complains of cough and sore throat for 2 days, no fever. No chest pain. No other complaint. Patient vomited this morning after mucous got stuck in her throat. She denies nausea at present. Exam no distress. Speaks in paragraphs. lungs diffuse rhonchi. Coughing occasionally.  [name], MD 04/04/14 1451

## 2014-04-04 NOTE — ED Notes (Addendum)
Per ems pt vomited x1 then complaining of burning sensation in throat. Productive cough present. Hx of MI and afib. Family reports cough has been going on for a couple of days, unsure of her medications. Denies pain

## 2014-04-06 ENCOUNTER — Encounter: Payer: Self-pay | Admitting: Podiatrist

## 2014-04-06 ENCOUNTER — Ambulatory Visit (INDEPENDENT_AMBULATORY_CARE_PROVIDER_SITE_OTHER): Payer: Medicare Other

## 2014-04-06 ENCOUNTER — Ambulatory Visit (INDEPENDENT_AMBULATORY_CARE_PROVIDER_SITE_OTHER): Payer: Medicare Other | Admitting: Podiatrist

## 2014-04-06 VITALS — BP 88/70 | HR 49 | Resp 18

## 2014-04-06 DIAGNOSIS — R52 Pain, unspecified: Secondary | ICD-10-CM

## 2014-04-06 DIAGNOSIS — I6529 Occlusion and stenosis of unspecified carotid artery: Secondary | ICD-10-CM

## 2014-04-06 DIAGNOSIS — E1142 Type 2 diabetes mellitus with diabetic polyneuropathy: Secondary | ICD-10-CM

## 2014-04-06 DIAGNOSIS — E1140 Type 2 diabetes mellitus with diabetic neuropathy, unspecified: Secondary | ICD-10-CM

## 2014-04-06 DIAGNOSIS — E1149 Type 2 diabetes mellitus with other diabetic neurological complication: Secondary | ICD-10-CM

## 2014-04-06 DIAGNOSIS — M2040 Other hammer toe(s) (acquired), unspecified foot: Secondary | ICD-10-CM

## 2014-04-06 NOTE — Progress Notes (Signed)
   Subjective:    Patient ID: [name], female    DOB: [birth year], 80 y.o.   MRN: [id number]  HPI my right foot has been going on for a long time and burns and throbs and I am a diabetic and tingling and my feet get some coldness and I was in the hospital for throwing up yesterday and I do trim on my nails and I think I may have snipped my skin    Review of Systems  Constitutional: Positive for unexpected weight change.  HENT: Positive for hearing loss and trouble swallowing.   Respiratory: Positive for cough and shortness of breath.   Cardiovascular: Positive for chest pain.  Gastrointestinal: Positive for vomiting and diarrhea.  Musculoskeletal: Positive for gait problem.       I do trip and fall alot  Hematological: Bruises/bleeds easily.  Psychiatric/Behavioral: The patient is nervous/anxious.   All other systems reviewed and are negative.      Objective:   Physical Exam GENERAL APPEARANCE: Alert, conversant. Appropriately groomed. No acute distress.  VASCULAR: Pedal pulses palpable at 1/4 DP and PT bilateral.  Capillary refill time is immediate to all digits,  Proximal to distal cooling it warm to warm.   NEUROLOGIC: sensation is decreased epicritically and protectively to 5.07 monofilament at 1/5 sites bilateral.  Light touch is decreased bilateral, vibratory sensation absent bilateral, numbness and tingling subjectiely related. MUSCULOSKELETAL: severe hammertoe contracture present 2nd digit right.  Bunion and laterally deviated hallux is present as well.    DERMATOLOGIC: skin color, texture, and turger are within normal limits.  No preulcerative lesions are seen, no interdigital maceration noted.  No open lesions present.  Digital nails are asymptomatic.  She nipped the tip of the left 3rd toe but it is OK.    Assessment & Plan:  Neuropathy, bunion, hammertoe, diabetes  Discussed diabetic shoes as I feel she is at risk for ulceration due to her neuropathy and her  contracture deformity of the toe.  Also discussed padding for the toe.  Due to her age, she is not a good candidate for elective surgery so we will treat conservatively/.

## 2014-04-18 ENCOUNTER — Ambulatory Visit (INDEPENDENT_AMBULATORY_CARE_PROVIDER_SITE_OTHER): Payer: Medicare Other | Admitting: Internal Medicine

## 2014-04-18 ENCOUNTER — Encounter: Payer: Self-pay | Admitting: Internal Medicine

## 2014-04-18 VITALS — BP 138/78 | HR 59 | Temp 97.5°F | Resp 18 | Ht 65.0 in | Wt 124.0 lb

## 2014-04-18 DIAGNOSIS — R197 Diarrhea, unspecified: Secondary | ICD-10-CM

## 2014-04-18 DIAGNOSIS — I6529 Occlusion and stenosis of unspecified carotid artery: Secondary | ICD-10-CM

## 2014-04-18 DIAGNOSIS — R918 Other nonspecific abnormal finding of lung field: Secondary | ICD-10-CM

## 2014-04-18 DIAGNOSIS — I10 Essential (primary) hypertension: Secondary | ICD-10-CM

## 2014-04-18 DIAGNOSIS — R9389 Abnormal findings on diagnostic imaging of other specified body structures: Secondary | ICD-10-CM | POA: Insufficient documentation

## 2014-04-18 NOTE — Patient Instructions (Addendum)
Continue current meds 

## 2014-04-18 NOTE — Progress Notes (Signed)
Patient ID: [name], female   DOB: 01-Feb-1925, 80 y.o.   MRN: [id number]    Location:  [facility]   Place of Service: OFFICE    No Known Allergies  Chief Complaint  Patient presents with  . Hospitalization Follow-up    hospital f/u from 04/04/14     HPI:  Abnormal CXR - new 9 mm nodule in the RUL of lungs noted on CXR 04/04/14.  Diarrhea: improved on Imodium. Afebrile. No abd pain or cramps or blood in the stool.  HTN (hypertension): controlled    Medications: Patient's Medications  New Prescriptions   No medications on file  Previous Medications   ALBUTEROL (PROVENTIL HFA;VENTOLIN HFA) 108 (90 BASE) MCG/ACT INHALER    Inhale 1-2 puffs into the lungs every 6 (six) hours as needed for wheezing.   AMOXICILLIN (AMOXIL) 500 MG CAPSULE       BENZONATATE (TESSALON) 100 MG CAPSULE       ESTRADIOL (ESTRACE) 0.5 MG TABLET    Take 0.5 mg by mouth daily.   HYDROXYPROPYL METHYLCELLULOSE (ISOPTO TEARS) 2.5 % OPHTHALMIC SOLUTION    Place 1 drop into both eyes as needed for dry eyes.   LEVOTHYROXINE (SYNTHROID, LEVOTHROID) 50 MCG TABLET    Take 50 mcg by mouth daily before breakfast. Wednesday and Friday only   LEVOTHYROXINE (SYNTHROID, LEVOTHROID) 75 MCG TABLET    Take 75 mcg by mouth daily before breakfast. Monday Tuesday Thursday Saturday and Sunday   LISINOPRIL (PRINIVIL,ZESTRIL) 5 MG TABLET    Take 1 tablet (5 mg total) by mouth daily.   METFORMIN (GLUCOPHAGE) 500 MG TABLET    Take 500-1,000 mg by mouth 2 (two) times daily with a meal. 100 mg in the am and 500 mg in the pm   METOPROLOL (LOPRESSOR) 50 MG TABLET    Take 50 mg by mouth 2 (two) times daily. For blood pressure   SPIRONOLACTONE (ALDACTONE) 25 MG TABLET    Take 25 mg by mouth daily. To strengthen the heart and regulate blood pressure   WARFARIN (COUMADIN) 7.5 MG TABLET    Take 3.75-7.5 mg by mouth daily. 3.75 mg on Monday Wednesday and Friday. 7.5 mg on Tuesday Thursday Saturday and Sunday  Modified Medications   No  medications on file  Discontinued Medications   No medications on file     Review of Systems  Constitutional: Positive for fatigue.  Eyes: Negative.   Respiratory: Positive for shortness of breath.   Cardiovascular: Positive for palpitations. Negative for chest pain and leg swelling.  Gastrointestinal: Positive for diarrhea. Negative for nausea, abdominal pain and abdominal distention.       Incontinent of stool sometimes  Endocrine:       History of thyroid problems and diabetes.  Genitourinary: Negative.        Diarrhea. No cramps. No blood in stool.  Musculoskeletal: Positive for arthralgias and gait problem.       Sometimes she feels like she could fall oveer when she walks through her yard.  Skin:       Recent fall and multile abrasions. Hematoma of the forehead.  Neurological: Positive for weakness.       Mild memory deficits.  Hematological: Negative.   Psychiatric/Behavioral: Negative.     Filed Vitals:   04/18/14 1453  BP: 138/78  Pulse: 59  Temp: 97.5 F (36.4 C)  TempSrc: Oral  Resp: 18  Height: 5' 5" (1.651 m)  Weight: 124 lb (56.246 kg)  SpO2: 98%   Body  mass index is 20.63 kg/(m^2).  Physical Exam  Constitutional: She is oriented to person, place, and time. No distress.  Frail elderly female  HENT:  Head: Normocephalic and atraumatic.  Nose: Nose normal.  Mouth/Throat: No oropharyngeal exudate.  Eyes: Conjunctivae and EOM are normal. Pupils are equal, round, and reactive to light.  Prescription lenses  Neck: Normal range of motion. Neck supple. No JVD present. No tracheal deviation present. No thyromegaly present.  Chronically hoarse.  Cardiovascular: Normal rate, regular rhythm, normal heart sounds and intact distal pulses.  Exam reveals no gallop and no friction rub.   No murmur heard. Pulmonary/Chest: Effort normal and breath sounds normal. No respiratory distress. She has no wheezes. She has no rales.  Abdominal: Soft. Bowel sounds are normal.  She exhibits no distension and no mass. There is no tenderness.  Musculoskeletal: She exhibits no tenderness.  Reduced extension and flexion of the right knee. Heberden's nodes of the hands. Hammer toe on the right foot 2nd toe.  Lymphadenopathy:    She has no cervical adenopathy.  Neurological: She is alert and oriented to person, place, and time. No cranial nerve deficit.  Slow and sometimes vague responses to questions  Skin: Skin is warm and dry. No rash noted. No erythema. No pallor.  Multiple abrasions and bruises. None are infected.  Psychiatric: She has a normal mood and affect. Her behavior is normal. Thought content normal.     Labs reviewed: Admission on 04/04/2014, Discharged on 04/04/2014  Component Date Value Ref Range Status  . WBC 04/04/2014 7.2  4.0 - 10.5 K/uL Final  . RBC 04/04/2014 3.57* 3.87 - 5.11 MIL/uL Final  . Hemoglobin 04/04/2014 11.9* 12.0 - 15.0 g/dL Final  . HCT 04/04/2014 34.3* 36.0 - 46.0 % Final  . MCV 04/04/2014 96.1  78.0 - 100.0 fL Final  . MCH 04/04/2014 33.3  26.0 - 34.0 pg Final  . MCHC 04/04/2014 34.7  30.0 - 36.0 g/dL Final  . RDW 04/04/2014 12.9  11.5 - 15.5 % Final  . Platelets 04/04/2014 277  150 - 400 K/uL Final  . Troponin i, poc 04/04/2014 0.00  0.00 - 0.08 ng/mL Final  . Comment 3 04/04/2014          Final   Comment: Due to the release kinetics of cTnI,                          a negative result within the first hours                          of the onset of symptoms does not rule out                          myocardial infarction with certainty.                          If myocardial infarction is still suspected,                          repeat the test at appropriate intervals.  . Color, Urine 04/04/2014 YELLOW  YELLOW Final  . APPearance 04/04/2014 CLOUDY* CLEAR Final  . Specific Gravity, Urine 04/04/2014 1.014  1.005 - 1.030 Final  . pH 04/04/2014 6.0  5.0 - 8.0 Final  . Glucose, UA 04/04/2014 NEGATIVE  NEGATIVE mg/dL  Final  .  Hgb urine dipstick 04/04/2014 NEGATIVE  NEGATIVE Final  . Bilirubin Urine 04/04/2014 NEGATIVE  NEGATIVE Final  . Ketones, ur 04/04/2014 NEGATIVE  NEGATIVE mg/dL Final  . Protein, ur 04/04/2014 NEGATIVE  NEGATIVE mg/dL Final  . Urobilinogen, UA 04/04/2014 0.2  0.0 - 1.0 mg/dL Final  . Nitrite 04/04/2014 NEGATIVE  NEGATIVE Final  . Leukocytes, UA 04/04/2014 NEGATIVE  NEGATIVE Final   MICROSCOPIC NOT DONE ON URINES WITH NEGATIVE PROTEIN, BLOOD, LEUKOCYTES, NITRITE, OR GLUCOSE <1000 mg/dL.  . Lipase 04/04/2014 69* 11 - 59 U/L Final  . Sodium 04/04/2014 138  137 - 147 mEq/L Final  . Potassium 04/04/2014 4.3  3.7 - 5.3 mEq/L Final  . Chloride 04/04/2014 99  96 - 112 mEq/L Final  . CO2 04/04/2014 24  19 - 32 mEq/L Final  . Glucose, Bld 04/04/2014 165* 70 - 99 mg/dL Final  . BUN 04/04/2014 18  6 - 23 mg/dL Final  . Creatinine, Ser 04/04/2014 0.93  0.50 - 1.10 mg/dL Final  . Calcium 04/04/2014 10.2  8.4 - 10.5 mg/dL Final  . Total Protein 04/04/2014 7.3  6.0 - 8.3 g/dL Final  . Albumin 04/04/2014 3.9  3.5 - 5.2 g/dL Final  . AST 04/04/2014 21  0 - 37 U/L Final  . ALT 04/04/2014 15  0 - 35 U/L Final  . Alkaline Phosphatase 04/04/2014 116  39 - 117 U/L Final  . Total Bilirubin 04/04/2014 0.4  0.3 - 1.2 mg/dL Final  . GFR calc non Af Amer 04/04/2014 54* >90 mL/min Final  . GFR calc Af Amer 04/04/2014 63* >90 mL/min Final   Comment: (NOTE)                          The eGFR has been calculated using the CKD EPI equation.                          This calculation has not been validated in all clinical situations.                          eGFR's persistently <90 mL/min signify possible Chronic Kidney                          Disease.  Marland Kitchen Prothrombin Time 04/04/2014 18.9* 11.6 - 15.2 seconds Final  . INR 04/04/2014 1.63* 0.00 - 1.49 Final  Anti-coag visit on 03/28/2014  Component Date Value Ref Range Status  . INR 03/28/2014 2.6   Final  Appointment on 03/13/2014  Component Date Value Ref Range  Status  . Hemoglobin A1C 03/13/2014 6.7* 4.8 - 5.6 % Final   Comment:          Increased risk for diabetes: 5.7 - 6.4                                   Diabetes: >6.4                                   Glycemic control for adults with diabetes: <7.0  . Estimated average glucose 03/13/2014 146   Final  . Glucose 03/13/2014 86  65 - 99 mg/dL Final  . BUN 03/13/2014 19  8 - 27  mg/dL Final  . Creatinine, Ser 03/13/2014 1.07* 0.57 - 1.00 mg/dL Final  . GFR calc non Af Amer 03/13/2014 47* >59 mL/min/1.73 Final  . GFR calc Af Amer 03/13/2014 54* >59 mL/min/1.73 Final  . BUN/Creatinine Ratio 03/13/2014 18  11 - 26 Final  . Sodium 03/13/2014 141  134 - 144 mmol/L Final  . Potassium 03/13/2014 4.8  3.5 - 5.2 mmol/L Final  . Chloride 03/13/2014 101  97 - 108 mmol/L Final  . CO2 03/13/2014 21  18 - 29 mmol/L Final  . Calcium 03/13/2014 9.5  8.7 - 10.3 mg/dL Final  Anti-coag visit on 03/07/2014  Component Date Value Ref Range Status  . INR 03/07/2014 1.7   Final  Anti-coag visit on 02/06/2014  Component Date Value Ref Range Status  . INR 02/06/2014 2.5   Final      Assessment/Plan  1. Abnormal CXR - CT Chest W Contrast; Future  2. Diarrhea Improved. Last colonoscopy in 2009  3. HTN (hypertension) controlled

## 2014-04-24 ENCOUNTER — Ambulatory Visit (INDEPENDENT_AMBULATORY_CARE_PROVIDER_SITE_OTHER): Payer: Medicare Other | Admitting: Pharmacist Clinician (PhC)/ Clinical Pharmacy Specialist

## 2014-04-24 DIAGNOSIS — I4891 Unspecified atrial fibrillation: Secondary | ICD-10-CM

## 2014-04-24 DIAGNOSIS — Z7901 Long term (current) use of anticoagulants: Secondary | ICD-10-CM

## 2014-04-24 LAB — POCT INR: INR: 1.8

## 2014-04-25 ENCOUNTER — Ambulatory Visit
Admission: RE | Admit: 2014-04-25 | Discharge: 2014-04-25 | Disposition: A | Discharge status: Home / Self Care | Payer: Medicare Other | Source: Ambulatory Visit | Attending: Internal Medicine | Admitting: Internal Medicine

## 2014-04-25 DIAGNOSIS — R9389 Abnormal findings on diagnostic imaging of other specified body structures: Secondary | ICD-10-CM

## 2014-04-25 MED ORDER — IOHEXOL 300 MG/ML  SOLN
75.0000 mL | Freq: Once | INTRAMUSCULAR | Status: AC | PRN
  Administered 2014-04-25: 75 mL via INTRAVENOUS

## 2014-05-14 ENCOUNTER — Other Ambulatory Visit: Payer: Self-pay | Admitting: *Deleted

## 2014-05-14 ENCOUNTER — Other Ambulatory Visit: Payer: Self-pay | Admitting: Pharmacist Clinician (PhC)/ Clinical Pharmacy Specialist

## 2014-05-14 MED ORDER — LISINOPRIL 5 MG PO TABS: 5.0000 mg | ORAL_TABLET | Freq: Every day | ORAL | Status: DC

## 2014-05-14 MED ORDER — WARFARIN SODIUM 7.5 MG PO TABS: ORAL_TABLET | ORAL | Status: DC

## 2014-05-21 ENCOUNTER — Ambulatory Visit (INDEPENDENT_AMBULATORY_CARE_PROVIDER_SITE_OTHER): Payer: Medicare Other | Admitting: Pharmacist Clinician (PhC)/ Clinical Pharmacy Specialist

## 2014-05-21 DIAGNOSIS — I4891 Unspecified atrial fibrillation: Secondary | ICD-10-CM

## 2014-05-21 DIAGNOSIS — Z7901 Long term (current) use of anticoagulants: Secondary | ICD-10-CM

## 2014-05-21 LAB — POCT INR: INR: 2.4

## 2014-05-27 ENCOUNTER — Other Ambulatory Visit: Payer: Self-pay | Admitting: Internal Medicine

## 2014-06-18 ENCOUNTER — Ambulatory Visit (INDEPENDENT_AMBULATORY_CARE_PROVIDER_SITE_OTHER): Payer: Medicare Other | Admitting: Pharmacist

## 2014-06-18 DIAGNOSIS — Z7901 Long term (current) use of anticoagulants: Secondary | ICD-10-CM

## 2014-06-18 DIAGNOSIS — I4891 Unspecified atrial fibrillation: Secondary | ICD-10-CM

## 2014-06-18 LAB — POCT INR: INR: 1.5

## 2014-06-23 ENCOUNTER — Other Ambulatory Visit: Payer: Self-pay | Admitting: Nurse Practitioner

## 2014-06-29 ENCOUNTER — Other Ambulatory Visit: Payer: Self-pay | Admitting: Internal Medicine

## 2014-07-02 ENCOUNTER — Ambulatory Visit (INDEPENDENT_AMBULATORY_CARE_PROVIDER_SITE_OTHER): Payer: Medicare Other | Admitting: Pharmacist Clinician (PhC)/ Clinical Pharmacy Specialist

## 2014-07-02 DIAGNOSIS — I4891 Unspecified atrial fibrillation: Secondary | ICD-10-CM

## 2014-07-02 DIAGNOSIS — Z7901 Long term (current) use of anticoagulants: Secondary | ICD-10-CM

## 2014-07-02 LAB — POCT INR: INR: 2

## 2014-07-04 ENCOUNTER — Telehealth: Payer: Self-pay | Admitting: Pharmacist Clinician (PhC)/ Clinical Pharmacy Specialist

## 2014-07-04 NOTE — Telephone Encounter (Signed)
Reviewed with Dr. Gwenlyn Found, ok to hold warfarin x 3 days for upcoming multiple dental extractoins.  Faxed letter to [facility], they will call when date set with patient.

## 2014-07-10 ENCOUNTER — Telehealth: Payer: Self-pay | Admitting: Pharmacist Clinician (PhC)/ Clinical Pharmacy Specialist

## 2014-07-10 NOTE — Telephone Encounter (Signed)
Reviewed with Dr. Gwenlyn Found.  [place] for patient to hold warfarin for dental extraction.

## 2014-07-12 ENCOUNTER — Other Ambulatory Visit: Payer: Medicare Other

## 2014-07-12 DIAGNOSIS — E119 Type 2 diabetes mellitus without complications: Secondary | ICD-10-CM

## 2014-07-12 DIAGNOSIS — E039 Hypothyroidism, unspecified: Secondary | ICD-10-CM

## 2014-07-12 DIAGNOSIS — I10 Essential (primary) hypertension: Secondary | ICD-10-CM

## 2014-07-13 LAB — HEMOGLOBIN A1C
Est. average glucose Bld gHb Est-mCnc: 140 mg/dL
Hgb A1c MFr Bld: 6.5 % — ABNORMAL HIGH (ref 4.8–5.6)

## 2014-07-13 LAB — COMPREHENSIVE METABOLIC PANEL
A/G RATIO: 1.8 (ref 1.1–2.5)
ALT: 12 IU/L (ref 0–32)
AST: 19 IU/L (ref 0–40)
Albumin: 4.5 g/dL (ref 3.5–4.7)
Alkaline Phosphatase: 108 IU/L (ref 39–117)
BILIRUBIN TOTAL: 0.5 mg/dL (ref 0.0–1.2)
BUN / CREAT RATIO: 21 (ref 11–26)
BUN: 25 mg/dL (ref 8–27)
CO2: 22 mmol/L (ref 18–29)
Calcium: 9.8 mg/dL (ref 8.7–10.3)
Chloride: 99 mmol/L (ref 97–108)
Creatinine, Ser: 1.19 mg/dL — ABNORMAL HIGH (ref 0.57–1.00)
GFR, EST AFRICAN AMERICAN: 48 mL/min/{1.73_m2} — AB (ref >59)
GFR, EST NON AFRICAN AMERICAN: 41 mL/min/{1.73_m2} — AB (ref >59)
GLOBULIN, TOTAL: 2.5 g/dL (ref 1.5–4.5)
Glucose: 86 mg/dL (ref 65–99)
POTASSIUM: 4.5 mmol/L (ref 3.5–5.2)
SODIUM: 140 mmol/L (ref 134–144)
Total Protein: 7 g/dL (ref 6.0–8.5)

## 2014-07-13 LAB — MICROALBUMIN / CREATININE URINE RATIO
Creatinine, Ur: 98.6 mg/dL (ref 15.0–278.0)
MICROALB/CREAT RATIO: 4.5 mg/g{creat} (ref 0.0–30.0)
Microalbumin, Urine: 4.4 ug/mL (ref 0.0–17.0)

## 2014-07-13 LAB — TSH: TSH: 2.13 u[IU]/mL (ref 0.450–4.500)

## 2014-07-17 ENCOUNTER — Ambulatory Visit (INDEPENDENT_AMBULATORY_CARE_PROVIDER_SITE_OTHER): Payer: Medicare Other | Admitting: Internal Medicine

## 2014-07-17 ENCOUNTER — Encounter: Payer: Self-pay | Admitting: Internal Medicine

## 2014-07-17 VITALS — BP 130/78 | HR 58 | Temp 98.5°F | Ht 65.5 in | Wt 122.2 lb

## 2014-07-17 DIAGNOSIS — E039 Hypothyroidism, unspecified: Secondary | ICD-10-CM

## 2014-07-17 DIAGNOSIS — E1165 Type 2 diabetes mellitus with hyperglycemia: Secondary | ICD-10-CM

## 2014-07-17 DIAGNOSIS — I482 Chronic atrial fibrillation, unspecified: Secondary | ICD-10-CM

## 2014-07-17 DIAGNOSIS — I10 Essential (primary) hypertension: Secondary | ICD-10-CM

## 2014-07-17 DIAGNOSIS — I6529 Occlusion and stenosis of unspecified carotid artery: Secondary | ICD-10-CM

## 2014-07-17 DIAGNOSIS — I4891 Unspecified atrial fibrillation: Secondary | ICD-10-CM

## 2014-07-17 DIAGNOSIS — Z7901 Long term (current) use of anticoagulants: Secondary | ICD-10-CM

## 2014-07-17 DIAGNOSIS — E119 Type 2 diabetes mellitus without complications: Secondary | ICD-10-CM

## 2014-07-17 NOTE — Patient Instructions (Signed)
Continue current medications. 

## 2014-07-17 NOTE — Progress Notes (Signed)
Patient ID: [name], female   DOB: August 01, 1925, 80 y.o.   MRN: [id number]    Location:    [facility]  Place of Service:  OFFICE    No Known Allergies  Chief Complaint  Patient presents with  . Medical Management of Chronic Issues    4 month follow-up, discuss labs (copy printed)     HPI:  Type 2 diabetes mellitus with hyperglycemia - Controlled  Essential hypertension; controlled  Chronic atrial fibrillation: rate controlled  Long term (current) use of anticoagulants: continues on warfarin for the AF  Unspecified hypothyroidism - compensated    Medications: Patient's Medications  New Prescriptions   No medications on file  Previous Medications   ESTRADIOL (ESTRACE) 0.5 MG TABLET    Take 0.5 mg by mouth daily.   HYDROXYPROPYL METHYLCELLULOSE (ISOPTO TEARS) 2.5 % OPHTHALMIC SOLUTION    Place 1 drop into both eyes as needed for dry eyes.   LEVOTHYROXINE (SYNTHROID, LEVOTHROID) 50 MCG TABLET    Take 50 mcg by mouth daily before breakfast. Wednesday and Friday only   LEVOTHYROXINE (SYNTHROID, LEVOTHROID) 75 MCG TABLET    TAKE 1 TABLET ON MONDAY, TUESDAY, THURSDAY, SATURDAY AND SUNDAY FOR THYROID SUPPLEMENT   LISINOPRIL (PRINIVIL,ZESTRIL) 5 MG TABLET    Take 1 tablet (5 mg total) by mouth daily.   METFORMIN (GLUCOPHAGE) 500 MG TABLET    TAKE 2 TABLETS BY MOUTH EVERY MORNING AND 1 TAB AT SUPPER TO CONTROL BLOOD SUGAR   METOPROLOL (LOPRESSOR) 50 MG TABLET    Take 50 mg by mouth 2 (two) times daily. For blood pressure   SPIRONOLACTONE (ALDACTONE) 25 MG TABLET    TAKE 1 TABLET BY MOUTH TWICE A DAY TO STRENGTHEN THE HEART AND REGULATE BLOOD PRESSURE   WARFARIN (COUMADIN) 7.5 MG TABLET    Take 1 tablet by mouth daily or as directed by coumadin clinic  Modified Medications   No medications on file  Discontinued Medications   LEVOTHYROXINE (SYNTHROID, LEVOTHROID) 75 MCG TABLET    Take 75 mcg by mouth daily before breakfast. Monday Tuesday Thursday Saturday and Sunday   METFORMIN  (GLUCOPHAGE) 500 MG TABLET    Take 500-1,000 mg by mouth 2 (two) times daily with a meal. 100 mg in the am and 500 mg in the pm   SPIRONOLACTONE (ALDACTONE) 25 MG TABLET    Take 25 mg by mouth daily. To strengthen the heart and regulate blood pressure     Review of Systems  Constitutional: Positive for fatigue.  Eyes: Negative.   Respiratory: Positive for shortness of breath.   Cardiovascular: Positive for palpitations. Negative for chest pain and leg swelling.  Gastrointestinal: Positive for diarrhea. Negative for nausea, abdominal pain and abdominal distention.       Incontinent of stool sometimes  Endocrine:       History of thyroid problems and diabetes.  Genitourinary: Negative.        Diarrhea. No cramps. No blood in stool.  Musculoskeletal: Positive for arthralgias and gait problem.       Sometimes she feels like she could fall over when she walks through her yard.  Neurological: Positive for weakness.       Mild memory deficits.  Hematological: Negative.   Psychiatric/Behavioral: Negative.     Filed Vitals:   07/17/14 1509  BP: 130/78  Pulse: 58  Temp: 98.5 F (36.9 C)  TempSrc: Oral  Height: 5' 5.5" (1.664 m)  Weight: 122 lb 3.2 oz (55.43 kg)  SpO2: 96%  Body mass index is 20.02 kg/(m^2).  Physical Exam  Constitutional: She is oriented to person, place, and time. No distress.  Frail elderly female  HENT:  Head: Normocephalic and atraumatic.  Nose: Nose normal.  Mouth/Throat: No oropharyngeal exudate.  Eyes: Conjunctivae and EOM are normal. Pupils are equal, round, and reactive to light.  Prescription lenses  Neck: Normal range of motion. Neck supple. No JVD present. No tracheal deviation present. No thyromegaly present.  Chronically hoarse.  Cardiovascular: Normal rate, regular rhythm, normal heart sounds and intact distal pulses.  Exam reveals no gallop and no friction rub.   No murmur heard. Pulmonary/Chest: Effort normal and breath sounds normal. No  respiratory distress. She has no wheezes. She has no rales.  Abdominal: Soft. Bowel sounds are normal. She exhibits no distension and no mass. There is no tenderness.  Musculoskeletal: She exhibits no tenderness.  Reduced extension and flexion of the right knee. Heberden's nodes of the hands. Hammer toe on the right foot 2nd toe.  Lymphadenopathy:    She has no cervical adenopathy.  Neurological: She is alert and oriented to person, place, and time. No cranial nerve deficit.  Slow and sometimes vague responses to questions Diminished vibratory sensation in the great toes.  Skin: Skin is warm and dry. No rash noted. No erythema. No pallor.  Psychiatric: She has a normal mood and affect. Her behavior is normal. Thought content normal.     Labs reviewed: Appointment on 07/12/2014  Component Date Value Ref Range Status  . Glucose 07/12/2014 86  65 - 99 mg/dL Final  . BUN 07/12/2014 25  8 - 27 mg/dL Final  . Creatinine, Ser 07/12/2014 1.19* 0.57 - 1.00 mg/dL Final  . GFR calc non Af Amer 07/12/2014 41* >59 mL/min/1.73 Final  . GFR calc Af Amer 07/12/2014 48* >59 mL/min/1.73 Final  . BUN/Creatinine Ratio 07/12/2014 21  11 - 26 Final  . Sodium 07/12/2014 140  134 - 144 mmol/L Final  . Potassium 07/12/2014 4.5  3.5 - 5.2 mmol/L Final  . Chloride 07/12/2014 99  97 - 108 mmol/L Final  . CO2 07/12/2014 22  18 - 29 mmol/L Final  . Calcium 07/12/2014 9.8  8.7 - 10.3 mg/dL Final  . Total Protein 07/12/2014 7.0  6.0 - 8.5 g/dL Final  . Albumin 07/12/2014 4.5  3.5 - 4.7 g/dL Final  . Globulin, Total 07/12/2014 2.5  1.5 - 4.5 g/dL Final  . Albumin/Globulin Ratio 07/12/2014 1.8  1.1 - 2.5 Final  . Total Bilirubin 07/12/2014 0.5  0.0 - 1.2 mg/dL Final  . Alkaline Phosphatase 07/12/2014 108  39 - 117 IU/L Final  . AST 07/12/2014 19  0 - 40 IU/L Final  . ALT 07/12/2014 12  0 - 32 IU/L Final  . TSH 07/12/2014 2.130  0.450 - 4.500 uIU/mL Final  . Hemoglobin A1C 07/12/2014 6.5* 4.8 - 5.6 % Final    Comment:          Increased risk for diabetes: 5.7 - 6.4                                   Diabetes: >6.4                                   Glycemic control for adults with diabetes: <7.0  . Estimated average glucose 07/12/2014 140  Final  . Creatinine, Ur 07/12/2014 98.6  15.0 - 278.0 mg/dL Final  . Microalbum.,U,Random 07/12/2014 4.4  0.0 - 17.0 ug/mL Final  . MICROALB/CREAT RATIO 07/12/2014 4.5  0.0 - 30.0 mg/g creat Final  Anti-coag visit on 07/02/2014  Component Date Value Ref Range Status  . INR 07/02/2014 2   Final  Anti-coag visit on 06/18/2014  Component Date Value Ref Range Status  . INR 06/18/2014 1.5   Final  Anti-coag visit on 05/21/2014  Component Date Value Ref Range Status  . INR 05/21/2014 2.4   Final  Anti-coag visit on 04/24/2014  Component Date Value Ref Range Status  . INR 04/24/2014 1.8   Final      Assessment/Plan  1. Type 2 diabetes mellitus with hyperglycemia controlled - Basic metabolic panel; Future - Hemoglobin A1c; Future  2. Essential hypertension controlled  3. Chronic atrial fibrillation Rate controlled  4. Long term (current) use of anticoagulants Continue warfarin  5. Unspecified hypothyroidism compensated - TSH; Future

## 2014-07-18 ENCOUNTER — Encounter: Payer: Self-pay | Admitting: Internal Medicine

## 2014-07-20 ENCOUNTER — Encounter: Payer: Self-pay | Admitting: Surgery

## 2014-07-23 ENCOUNTER — Encounter: Payer: Self-pay | Admitting: Surgery

## 2014-07-23 ENCOUNTER — Ambulatory Visit (INDEPENDENT_AMBULATORY_CARE_PROVIDER_SITE_OTHER): Payer: Medicare Other | Admitting: Surgery

## 2014-07-23 ENCOUNTER — Ambulatory Visit (HOSPITAL_COMMUNITY)
Admission: RE | Admit: 2014-07-23 | Discharge: 2014-07-23 | Disposition: A | Discharge status: Home / Self Care | Payer: Medicare Other | Source: Ambulatory Visit | Attending: Surgery | Admitting: Surgery

## 2014-07-23 VITALS — BP 122/54 | HR 58 | Ht 65.5 in | Wt 123.4 lb

## 2014-07-23 DIAGNOSIS — I6529 Occlusion and stenosis of unspecified carotid artery: Secondary | ICD-10-CM | POA: Insufficient documentation

## 2014-07-23 NOTE — Progress Notes (Signed)
Patient name: [name] MRN: [id number] DOB: 10/10/1925 Sex: female     Chief Complaint  Patient presents with  . Re-evaluation    2 year f/u carotid    HISTORY OF PRESENT ILLNESS: Patient returns today for followup of her carotid stenosis. She is an 80 year old female that I met in February of this year for evaluation of her carotid disease. At that time she just recently had an episode where she woke up early in the morning with the remaining dark when she stood up she noted that she was significantly off balance. She went back to sleep and 4 hours later she felt a little better .  She reports that she will occasionally get dizzy when she stands.  She has numbness in her feet.  She denies amaurosis fugax or weakness in either extremity.  She denies slurred speech.  Patient continues to be treated for diabetes.  She suffers from neuropathy.  She is on multiple medications for hypertension.  She takes Coumadin for atrial fibrillation.   Past Medical History  Diagnosis Date  . OSA (obstructive sleep apnea)     AHI-44/hr, AHI during REM 61.8/hr, avg O2 during REM and NREM 96%  . Atrial fibrillation   . Myocardial infarct   . Diabetes mellitus   . Hearing loss   . Hypertension   . Hyperlipidemia   . Senile degeneration of brain   . Other generalized ischemic cerebrovascular disease   . Personal history of fall   . Pain in joint, shoulder region   . 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 Loss of weight   . Chronic kidney disease, stage II (mild)   . Obstructive sleep apnea (adult) (pediatric)   . Senile degeneration of brain   . Dizziness and giddiness   . Personal history of noncompliance with medical treatment, presenting hazards to health   . Dyskinesia of esophagus   . Shortness of breath   . Chest pain, unspecified 06/19/2009  . Long term (current) use of anticoagulants   . Unspecified hereditary and idiopathic peripheral neuropathy   .  Pain in joint, site unspecified   . Carpal tunnel syndrome   . Other hammer toe (acquired)   . Alopecia areata   . Dysphagia, unspecified(787.20)   . Occlusion and stenosis of carotid artery without mention of cerebral infarction   . Other symptoms involving cardiovascular system   . Other acne   . Allergic rhinitis, cause unspecified   . Type II or unspecified type diabetes mellitus without mention of complication, uncontrolled   . Varicose veins of lower extremities   . Cervicalgia   . Disturbance of salivary secretion   . Insomnia, unspecified   . Unspecified hypothyroidism   . Palpitations 715.90  . Other malaise and fatigue   . Diverticulosis of colon (without mention of hemorrhage)   . Benign neoplasm of colon   . Depressive disorder, not elsewhere classified   . Syncope and collapse   . Paroxysmal atrial fibrillation   . Hypertension     Past Surgical History  Procedure Laterality Date  . Total abdominal hysterectomy    . Tonsillectomy    . Appendectomy    . Hand surgery    . Cataract od  10/04/2012  . Cataract os  01/31/2013  . Carotid doppler  07/25/2012    Bilat internal carotid arteries demonstrate 1-395 stenoses.  . Transthoracic echocardiogram  04/24/2011    EF >55%, moderate tricuspid regurg.  History   Social History  . Marital Status: Widowed    Spouse Name: N/A    Number of Children: Y  . Years of Education: N/A   Occupational History  . retired from Personal assistant    Social History Main Topics  . Smoking status: Never Smoker   . Smokeless tobacco: Not on file  . Alcohol Use: No  . Drug Use: No  . Sexual Activity: No   Other Topics Concern  . Not on file   Social History Narrative   Lives alone.    Family History  Problem Relation Age of Onset  . Cervical cancer Mother   . Cancer Mother   . Parkinsonism Father   . Sleep apnea Brother   . Diabetes Brother   . Sleep apnea Brother   . Peripheral vascular disease Brother   . Cancer  Daughter     Allergies as of 07/23/2014  . (No Known Allergies)    Current Outpatient Prescriptions on File Prior to Visit  Medication Sig Dispense Refill  . estradiol (ESTRACE) 0.5 MG tablet Take 0.5 mg by mouth daily.      . hydroxypropyl methylcellulose (ISOPTO TEARS) 2.5 % ophthalmic solution Place 1 drop into both eyes as needed for dry eyes.      Marland Kitchen levothyroxine (SYNTHROID, LEVOTHROID) 50 MCG tablet Take 50 mcg by mouth daily before breakfast. Wednesday and Friday only      . levothyroxine (SYNTHROID, LEVOTHROID) 75 MCG tablet TAKE 1 TABLET ON MONDAY, TUESDAY, THURSDAY, SATURDAY AND SUNDAY FOR THYROID SUPPLEMENT  30 tablet  5  . lisinopril (PRINIVIL,ZESTRIL) 5 MG tablet Take 1 tablet (5 mg total) by mouth daily.  90 tablet  1  . metFORMIN (GLUCOPHAGE) 500 MG tablet TAKE 2 TABLETS BY MOUTH EVERY MORNING AND 1 TAB AT SUPPER TO CONTROL BLOOD SUGAR  90 tablet  5  . metoprolol (LOPRESSOR) 50 MG tablet Take 50 mg by mouth 2 (two) times daily. For blood pressure      . spironolactone (ALDACTONE) 25 MG tablet TAKE 1 TABLET BY MOUTH TWICE A DAY TO STRENGTHEN THE HEART AND REGULATE BLOOD PRESSURE  60 tablet  3  . warfarin (COUMADIN) 7.5 MG tablet Take 1 tablet by mouth daily or as directed by coumadin clinic  90 tablet  1   No current facility-administered medications on file prior to visit.     REVIEW OF SYSTEMS: Cardiovascular: No chest pain, chest pressure, palpitations, positive for shortness of breath with exertion.  Positive for varicose veins.  No history of DVT or phlebitis. Pulmonary: No productive cough, asthma or wheezing. Neurologic: No weakness, paresthesias, aphasia, or amaurosis. No dizziness. Hematologic: No bleeding problems or clotting disorders. Musculoskeletal: No joint pain or joint swelling. Gastrointestinal: No blood in stool or hematemesis Genitourinary: No dysuria or hematuria. Psychiatric:: No history of major depression. Integumentary: No rashes or  ulcers. Constitutional: No fever or chills.  PHYSICAL EXAMINATION:   Vital signs are BP 122/54  Pulse 58  Ht 5' 5.5" (1.664 m)  Wt 123 lb 6.4 oz (55.974 kg)  BMI 20.22 kg/m2  SpO2 100% General: The patient appears their stated age. HEENT:  No gross abnormalities Pulmonary:  Non labored breathing Abdomen: Soft and non-tender.  Bulge within the right lower quadrant, likely abdominal wall hernia Musculoskeletal: There are no major deformities. Neurologic: No focal weakness or paresthesias are detected, Skin: There are no ulcer or rashes noted. Psychiatric: The patient has normal affect. Cardiovascular: There is a regular rate and rhythm  without significant murmur appreciated.  No carotid bruits.  Multiple spider veins throughout bilateral lower extremity.   Diagnostic Studies I have ordered and reviewed her carotid ultrasound.  This shows less than 40% stenosis within bilateral carotid arteries.  Both vertebral arteries are antegrade.  Assessment: Carotid stenosis. Plan: The patient has had no significant changes within her carotid ultrasound.  I do not feel any of her neurologic symptoms such as dizziness and balance issues are related to her extracranial carotid or vertebral artery disease.  I plan on having her come back for a followup ultrasound in 2 years.  I suspect the bulge within her right lower quadrant as an abdominal wall hernia.  Most likely no intervention will be recommended, however to fully evaluate this, general surgery should be consulted.  Eldridge Abrahams, M.D. [facility] Office: [phone] Pager:  [phone]

## 2014-07-25 NOTE — Addendum Note (Signed)
Addended by: Mena Goes on: 07/25/2014 10:03 AM   Modules accepted: Orders

## 2014-07-26 ENCOUNTER — Other Ambulatory Visit: Payer: Self-pay | Admitting: Nurse Practitioner

## 2014-07-30 ENCOUNTER — Ambulatory Visit (INDEPENDENT_AMBULATORY_CARE_PROVIDER_SITE_OTHER): Payer: Medicare Other | Admitting: Pharmacist Clinician (PhC)/ Clinical Pharmacy Specialist

## 2014-07-30 DIAGNOSIS — Z7901 Long term (current) use of anticoagulants: Secondary | ICD-10-CM

## 2014-07-30 DIAGNOSIS — I4891 Unspecified atrial fibrillation: Secondary | ICD-10-CM

## 2014-07-30 LAB — POCT INR: INR: 2.3

## 2014-08-27 ENCOUNTER — Ambulatory Visit (INDEPENDENT_AMBULATORY_CARE_PROVIDER_SITE_OTHER): Payer: Medicare Other | Admitting: Pharmacist Clinician (PhC)/ Clinical Pharmacy Specialist

## 2014-08-27 DIAGNOSIS — I4891 Unspecified atrial fibrillation: Secondary | ICD-10-CM

## 2014-08-27 DIAGNOSIS — Z7901 Long term (current) use of anticoagulants: Secondary | ICD-10-CM

## 2014-08-27 LAB — POCT INR: INR: 1.4

## 2014-09-10 ENCOUNTER — Ambulatory Visit (INDEPENDENT_AMBULATORY_CARE_PROVIDER_SITE_OTHER): Payer: Medicare Other | Admitting: Pharmacist Clinician (PhC)/ Clinical Pharmacy Specialist

## 2014-09-10 DIAGNOSIS — I4891 Unspecified atrial fibrillation: Secondary | ICD-10-CM

## 2014-09-10 DIAGNOSIS — Z7901 Long term (current) use of anticoagulants: Secondary | ICD-10-CM

## 2014-09-10 LAB — POCT INR: INR: 2.2

## 2014-09-20 ENCOUNTER — Telehealth: Payer: Self-pay | Admitting: *Deleted

## 2014-09-20 NOTE — Telephone Encounter (Signed)
Received paperwork from [facility] for Therapeutic footwear # [phone] Fax # [phone]. Given to Dr. Nyoka Cowden to sign

## 2014-10-08 ENCOUNTER — Ambulatory Visit (INDEPENDENT_AMBULATORY_CARE_PROVIDER_SITE_OTHER): Payer: Medicare Other | Admitting: Pharmacist Clinician (PhC)/ Clinical Pharmacy Specialist

## 2014-10-08 DIAGNOSIS — Z7901 Long term (current) use of anticoagulants: Secondary | ICD-10-CM

## 2014-10-08 DIAGNOSIS — I4891 Unspecified atrial fibrillation: Secondary | ICD-10-CM

## 2014-10-08 LAB — POCT INR: INR: 1.9

## 2014-11-03 ENCOUNTER — Other Ambulatory Visit: Payer: Self-pay | Admitting: Internal Medicine

## 2014-11-05 ENCOUNTER — Ambulatory Visit (INDEPENDENT_AMBULATORY_CARE_PROVIDER_SITE_OTHER): Payer: Medicare Other | Admitting: Pharmacist Clinician (PhC)/ Clinical Pharmacy Specialist

## 2014-11-05 DIAGNOSIS — Z7901 Long term (current) use of anticoagulants: Secondary | ICD-10-CM

## 2014-11-05 DIAGNOSIS — I4891 Unspecified atrial fibrillation: Secondary | ICD-10-CM

## 2014-11-05 LAB — POCT INR: INR: 2

## 2014-11-15 ENCOUNTER — Other Ambulatory Visit: Payer: Medicare Other

## 2014-11-15 DIAGNOSIS — E039 Hypothyroidism, unspecified: Secondary | ICD-10-CM

## 2014-11-15 DIAGNOSIS — E1165 Type 2 diabetes mellitus with hyperglycemia: Secondary | ICD-10-CM

## 2014-11-16 ENCOUNTER — Other Ambulatory Visit: Payer: Medicare Other

## 2014-11-16 LAB — BASIC METABOLIC PANEL
BUN / CREAT RATIO: 20 (ref 11–26)
BUN: 25 mg/dL (ref 8–27)
CALCIUM: 10.3 mg/dL (ref 8.7–10.3)
CO2: 23 mmol/L (ref 18–29)
CREATININE: 1.26 mg/dL — AB (ref 0.57–1.00)
Chloride: 99 mmol/L (ref 97–108)
GFR calc Af Amer: 44 mL/min/{1.73_m2} — ABNORMAL LOW (ref >59)
GFR calc non Af Amer: 38 mL/min/{1.73_m2} — ABNORMAL LOW (ref >59)
GLUCOSE: 100 mg/dL — AB (ref 65–99)
Potassium: 4.8 mmol/L (ref 3.5–5.2)
Sodium: 138 mmol/L (ref 134–144)

## 2014-11-16 LAB — HEMOGLOBIN A1C
ESTIMATED AVERAGE GLUCOSE: 137 mg/dL
Hgb A1c MFr Bld: 6.4 % — ABNORMAL HIGH (ref 4.8–5.6)

## 2014-11-16 LAB — TSH: TSH: 1.87 u[IU]/mL (ref 0.450–4.500)

## 2014-11-20 ENCOUNTER — Encounter: Payer: Self-pay | Admitting: Internal Medicine

## 2014-11-20 ENCOUNTER — Ambulatory Visit (INDEPENDENT_AMBULATORY_CARE_PROVIDER_SITE_OTHER): Payer: Medicare Other | Admitting: Internal Medicine

## 2014-11-20 ENCOUNTER — Other Ambulatory Visit: Payer: Self-pay | Admitting: Internal Medicine

## 2014-11-20 ENCOUNTER — Other Ambulatory Visit: Payer: Self-pay | Admitting: Cardiovascular Disease

## 2014-11-20 VITALS — BP 120/78 | HR 57 | Temp 97.4°F | Resp 18 | Ht 65.5 in | Wt 122.2 lb

## 2014-11-20 DIAGNOSIS — E039 Hypothyroidism, unspecified: Secondary | ICD-10-CM

## 2014-11-20 DIAGNOSIS — I10 Essential (primary) hypertension: Secondary | ICD-10-CM

## 2014-11-20 DIAGNOSIS — I6529 Occlusion and stenosis of unspecified carotid artery: Secondary | ICD-10-CM

## 2014-11-20 DIAGNOSIS — E1165 Type 2 diabetes mellitus with hyperglycemia: Secondary | ICD-10-CM

## 2014-11-20 DIAGNOSIS — Z7901 Long term (current) use of anticoagulants: Secondary | ICD-10-CM

## 2014-11-20 DIAGNOSIS — I482 Chronic atrial fibrillation, unspecified: Secondary | ICD-10-CM

## 2014-11-20 MED ORDER — WARFARIN SODIUM 7.5 MG PO TABS: ORAL_TABLET | ORAL | Status: DC

## 2014-11-20 MED ORDER — ESTRADIOL 0.5 MG PO TABS: ORAL_TABLET | ORAL | Status: DC

## 2014-11-20 NOTE — Progress Notes (Signed)
Patient ID: [name], female   DOB: 06-03-1925, 80 y.o.   MRN: [id number]    Facility  [facility]    Place of Service:   OFFICE   No Known Allergies  Chief Complaint  Patient presents with  . Medical Management of Chronic Issues    HPI:  Essential hypertension - controlled  Hypothyroidism, unspecified hypothyroidism type - Plan: TSH  Type 2 diabetes mellitus with hyperglycemia - controlled  Long term current use of anticoagulant therapy: therapeutic  Chronic atrial fibrillation: rate controlled    Medications: Patient's Medications  New Prescriptions   No medications on file  Previous Medications   ESTRADIOL (ESTRACE) 0.5 MG TABLET    TAKE 1 TABLET EVERY DAY FOR HORMONES   HYDROXYPROPYL METHYLCELLULOSE (ISOPTO TEARS) 2.5 % OPHTHALMIC SOLUTION    Place 1 drop into both eyes as needed for dry eyes.   LEVOTHYROXINE (SYNTHROID, LEVOTHROID) 50 MCG TABLET    Take 50 mcg by mouth daily before breakfast. Wednesday and Friday only   LEVOTHYROXINE (SYNTHROID, LEVOTHROID) 75 MCG TABLET    TAKE 1 TABLET ON MONDAY, TUESDAY, THURSDAY, SATURDAY AND SUNDAY FOR THYROID SUPPLEMENT   LISINOPRIL (PRINIVIL,ZESTRIL) 5 MG TABLET    Take 1 tablet (5 mg total) by mouth daily.   METFORMIN (GLUCOPHAGE) 500 MG TABLET    TAKE 2 TABLETS BY MOUTH EVERY MORNING AND 1 TAB AT SUPPER TO CONTROL BLOOD SUGAR   METOPROLOL (LOPRESSOR) 50 MG TABLET    Take 50 mg by mouth 2 (two) times daily. For blood pressure   METOPROLOL (LOPRESSOR) 50 MG TABLET    TAKE 1 TABLET BY MOUTH TWICE A DAY FOR BLOOD PRESSURE   SPIRONOLACTONE (ALDACTONE) 25 MG TABLET    TAKE 1 TABLET BY MOUTH TWICE A DAY TO STRENGTHEN THE HEART AND REGULATE BLOOD PRESSURE   WARFARIN (COUMADIN) 7.5 MG TABLET    Take 1 tablet by mouth daily or as directed by coumadin clinic  Modified Medications   No medications on file  Discontinued Medications   ESTRADIOL (ESTRACE) 0.5 MG TABLET    Take 0.5 mg by mouth daily.     Review of Systems    Constitutional: Positive for fatigue.  Eyes: Negative.   Respiratory: Positive for shortness of breath.   Cardiovascular: Positive for palpitations. Negative for chest pain and leg swelling.  Gastrointestinal: Positive for diarrhea. Negative for nausea, abdominal pain and abdominal distention.       Incontinent of stool sometimes  Endocrine:       History of thyroid problems and diabetes.  Genitourinary: Negative.        Diarrhea. No cramps. No blood in stool.  Musculoskeletal: Positive for arthralgias and gait problem.       Sometimes she feels like she could fall over when she walks through her yard.  Neurological: Positive for weakness.       Mild memory deficits.  Hematological: Negative.   Psychiatric/Behavioral: Negative.     Filed Vitals:   11/20/14 1512  BP: 120/78  Pulse: 57  Temp: 97.4 F (36.3 C)  TempSrc: Oral  Resp: 18  Height: 5' 5.5" (1.664 m)  Weight: 122 lb 3.2 oz (55.43 kg)  SpO2: 99%   Body mass index is 20.02 kg/(m^2).  Physical Exam  Constitutional: She is oriented to person, place, and time. No distress.  Frail elderly female  HENT:  Head: Normocephalic and atraumatic.  Nose: Nose normal.  Mouth/Throat: No oropharyngeal exudate.  Eyes: Conjunctivae and EOM are normal. Pupils are equal,  round, and reactive to light.  Prescription lenses  Neck: Normal range of motion. Neck supple. No JVD present. No tracheal deviation present. No thyromegaly present.  Chronically hoarse.  Cardiovascular: Normal rate, regular rhythm, normal heart sounds and intact distal pulses.  Exam reveals no gallop and no friction rub.   No murmur heard. Pulmonary/Chest: Effort normal and breath sounds normal. No respiratory distress. She has no wheezes. She has no rales.  Abdominal: Soft. Bowel sounds are normal. She exhibits no distension and no mass. There is no tenderness.  Musculoskeletal: She exhibits no tenderness.  Reduced extension and flexion of the right knee.  Heberden's nodes of the hands. Hammer toe on the right foot 2nd toe.  Lymphadenopathy:    She has no cervical adenopathy.  Neurological: She is alert and oriented to person, place, and time. No cranial nerve deficit.  Slow and sometimes vague responses to questions Diminished vibratory sensation in the great toes.  Skin: Skin is warm and dry. No rash noted. No erythema. No pallor.  Psychiatric: She has a normal mood and affect. Her behavior is normal. Thought content normal.     Labs reviewed: Appointment on 11/15/2014  Component Date Value Ref Range Status  . Glucose 11/15/2014 100* 65 - 99 mg/dL Final  . BUN 11/15/2014 25  8 - 27 mg/dL Final  . Creatinine, Ser 11/15/2014 1.26* 0.57 - 1.00 mg/dL Final  . GFR calc non Af Amer 11/15/2014 38* >59 mL/min/1.73 Final  . GFR calc Af Amer 11/15/2014 44* >59 mL/min/1.73 Final  . BUN/Creatinine Ratio 11/15/2014 20  11 - 26 Final  . Sodium 11/15/2014 138  134 - 144 mmol/L Final  . Potassium 11/15/2014 4.8  3.5 - 5.2 mmol/L Final  . Chloride 11/15/2014 99  97 - 108 mmol/L Final  . CO2 11/15/2014 23  18 - 29 mmol/L Final  . Calcium 11/15/2014 10.3  8.7 - 10.3 mg/dL Final  . Hgb A1c MFr Bld 11/15/2014 6.4* 4.8 - 5.6 % Final   Comment:          Pre-diabetes: 5.7 - 6.4          Diabetes: >6.4          Glycemic control for adults with diabetes: <7.0   . Est. average glucose Bld gHb Est-m* 11/15/2014 137   Final  . TSH 11/15/2014 1.870  0.450 - 4.500 uIU/mL Final  Anti-coag visit on 11/05/2014  Component Date Value Ref Range Status  . INR 11/05/2014 2   Final  Anti-coag visit on 10/08/2014  Component Date Value Ref Range Status  . INR 10/08/2014 1.9   Final  Anti-coag visit on 09/10/2014  Component Date Value Ref Range Status  . INR 09/10/2014 2.2   Final  Anti-coag visit on 08/27/2014  Component Date Value Ref Range Status  . INR 08/27/2014 1.4   Final     Assessment/Plan  1. Essential hypertension - Comprehensive metabolic panel;  Future  2. Hypothyroidism, unspecified hypothyroidism type - TSH; Future  3. Type 2 diabetes mellitus with hyperglycemia - Comprehensive metabolic panel; Future - Hemoglobin A1c; Future  4. Long term current use of anticoagulant therapy Continue current warfarin  5. Chronic atrial fibrillation Rate controlled

## 2014-11-23 ENCOUNTER — Ambulatory Visit: Payer: Medicare Other | Admitting: Podiatrist

## 2014-11-23 DIAGNOSIS — M2040 Other hammer toe(s) (acquired), unspecified foot: Secondary | ICD-10-CM

## 2014-11-23 NOTE — Progress Notes (Signed)
Pt is here for diabetic shoe measurement

## 2014-11-26 ENCOUNTER — Other Ambulatory Visit: Payer: Self-pay | Admitting: Internal Medicine

## 2014-12-01 ENCOUNTER — Other Ambulatory Visit: Payer: Self-pay | Admitting: Internal Medicine

## 2014-12-03 ENCOUNTER — Ambulatory Visit (INDEPENDENT_AMBULATORY_CARE_PROVIDER_SITE_OTHER): Payer: Medicare Other | Admitting: Pharmacist Clinician (PhC)/ Clinical Pharmacy Specialist

## 2014-12-03 DIAGNOSIS — Z7901 Long term (current) use of anticoagulants: Secondary | ICD-10-CM

## 2014-12-03 DIAGNOSIS — I4891 Unspecified atrial fibrillation: Secondary | ICD-10-CM

## 2014-12-03 LAB — POCT INR: INR: 1.8

## 2014-12-17 ENCOUNTER — Ambulatory Visit (INDEPENDENT_AMBULATORY_CARE_PROVIDER_SITE_OTHER): Payer: Medicare Other | Admitting: Pharmacist Clinician (PhC)/ Clinical Pharmacy Specialist

## 2014-12-17 DIAGNOSIS — I4891 Unspecified atrial fibrillation: Secondary | ICD-10-CM

## 2014-12-17 DIAGNOSIS — Z7901 Long term (current) use of anticoagulants: Secondary | ICD-10-CM

## 2014-12-17 LAB — POCT INR: INR: 3.5

## 2014-12-24 ENCOUNTER — Other Ambulatory Visit: Payer: Self-pay | Admitting: Internal Medicine

## 2014-12-26 ENCOUNTER — Encounter: Payer: Self-pay | Admitting: Podiatrist

## 2014-12-26 ENCOUNTER — Ambulatory Visit (INDEPENDENT_AMBULATORY_CARE_PROVIDER_SITE_OTHER): Payer: Medicare Other | Admitting: Podiatrist

## 2014-12-26 DIAGNOSIS — M2040 Other hammer toe(s) (acquired), unspecified foot: Secondary | ICD-10-CM

## 2014-12-26 DIAGNOSIS — E1140 Type 2 diabetes mellitus with diabetic neuropathy, unspecified: Secondary | ICD-10-CM

## 2014-12-26 NOTE — Patient Instructions (Signed)

## 2014-12-26 NOTE — Progress Notes (Signed)
Dispensed 1 pair of diabetic insoles and 3 pairs of insoles.

## 2015-01-02 NOTE — Progress Notes (Signed)
Patient presents today to pick up diabetic shoes.  Relates the shoes and inserts feel good and they are free of defect.  No other concerns present at today's visit  Shoes are dispensed and patient given instructions on wear and use of the diabetic inserts.   We will reappoint for continued care and recheck of the shoes in 2-3 months.  If any questions, concerns or problems arise, patient to call.   

## 2015-01-09 ENCOUNTER — Ambulatory Visit (INDEPENDENT_AMBULATORY_CARE_PROVIDER_SITE_OTHER): Payer: Medicare Other | Admitting: Cardiovascular Disease

## 2015-01-09 ENCOUNTER — Encounter: Payer: Self-pay | Admitting: Cardiovascular Disease

## 2015-01-09 ENCOUNTER — Ambulatory Visit (INDEPENDENT_AMBULATORY_CARE_PROVIDER_SITE_OTHER): Payer: Medicare Other | Admitting: Pharmacist Clinician (PhC)/ Clinical Pharmacy Specialist

## 2015-01-09 VITALS — BP 126/62 | HR 66 | Ht 65.0 in | Wt 126.0 lb

## 2015-01-09 DIAGNOSIS — I4891 Unspecified atrial fibrillation: Secondary | ICD-10-CM

## 2015-01-09 DIAGNOSIS — I480 Paroxysmal atrial fibrillation: Secondary | ICD-10-CM

## 2015-01-09 DIAGNOSIS — Z7901 Long term (current) use of anticoagulants: Secondary | ICD-10-CM

## 2015-01-09 DIAGNOSIS — G4733 Obstructive sleep apnea (adult) (pediatric): Secondary | ICD-10-CM

## 2015-01-09 DIAGNOSIS — I10 Essential (primary) hypertension: Secondary | ICD-10-CM

## 2015-01-09 LAB — POCT INR: INR: 1.8

## 2015-01-09 NOTE — Progress Notes (Signed)
01/09/2015 [name]   1925-07-09  [id number]  Primary Physician GREEN, Regina Spare, MD Primary Cardiologist: Regina Harp MD [name]   HPI:  The patient is an 81 year old, thin-appearing, widowed Caucasian female, mother of 60, grandmother to 4 grandchildren who I saw approximately a year ago. She has a history of paroxysmal atrial fibrillation with syncope in the past on Coumadin anticoagulation. She was catheterized by Dr. Alla Mccall Apr 21, 2007, revealing essentially normal coronary arteries and normal LV function. Her other problems include hypertension and insulin-dependent diabetes. She also apparently has obstructive sleep apnea but does not tolerate CPAP. Dr. Nyoka Mccall apparently has stopped her amiodarone and she did run out of her metoprolol which resulted in recurrent symptoms; however, since she has had that refilled she has been fairly asymptomatic. Since I saw her a year ago she denies chest pain or shortness of breath. She did have episode of falling on her face which was a result of tripping and not syncope. She gets fairly frequent dizziness. She did see Dr. Trula Mccall for vascular evaluation. Carotid Dopplers were unrevealing.   Current Outpatient Prescriptions  Medication Sig Dispense Refill  . estradiol (ESTRACE) 0.5 MG tablet TAKE 1 TABLET EVERY DAY FOR HORMONES 90 tablet 3  . hydroxypropyl methylcellulose (ISOPTO TEARS) 2.5 % ophthalmic solution Place 1 drop into both eyes as needed for dry eyes.    Marland Kitchen levothyroxine (SYNTHROID, LEVOTHROID) 50 MCG tablet Take 50 mcg by mouth daily before breakfast. Wednesday and Friday only    . levothyroxine (SYNTHROID, LEVOTHROID) 50 MCG tablet TAKE 1 TABLET ON WEDNESDAY THROUGH FRIDAY FOR THYROID SUPPLEMENT 36 tablet 2  . levothyroxine (SYNTHROID, LEVOTHROID) 75 MCG tablet TAKE 1 TABLET ON MONDAY, TUESDAY, THURSDAY, SATURDAY AND SUNDAY FOR THYROID SUPPLEMENT 30 tablet 5  . lisinopril (PRINIVIL,ZESTRIL) 5 MG tablet  TAKE 1 TABLET BY MOUTH DAILY. 90 tablet 1  . metFORMIN (GLUCOPHAGE) 500 MG tablet TAKE 2 TABLETS BY MOUTH EVERY MORNING AND 1 TAB AT SUPPER TO CONTROL BLOOD SUGAR 90 tablet 5  . metoprolol (LOPRESSOR) 50 MG tablet TAKE 1 TABLET BY MOUTH TWICE A DAY FOR BLOOD PRESSURE 60 tablet 5  . spironolactone (ALDACTONE) 25 MG tablet TAKE 1 TABLET BY MOUTH TWICE A DAY TO STRENGTHEN THE HEART AND REGULATE BLOOD PRESSURE 60 tablet 5  . warfarin (COUMADIN) 7.5 MG tablet Take 1 tablet by mouth daily or as directed by coumadin clinic 90 tablet 3   No current facility-administered medications for this visit.    No Known Allergies  History   Social History  . Marital Status: Widowed    Spouse Name: N/A    Number of Children: Y  . Years of Education: N/A   Occupational History  . retired from Personal assistant    Social History Main Topics  . Smoking status: Never Smoker   . Smokeless tobacco: Not on file  . Alcohol Use: No  . Drug Use: No  . Sexual Activity: No   Other Topics Concern  . Not on file   Social History Narrative   Lives alone.     Review of Systems: General: negative for chills, fever, night sweats or weight changes.  Cardiovascular: negative for chest pain, dyspnea on exertion, edema, orthopnea, palpitations, paroxysmal nocturnal dyspnea or shortness of breath Dermatological: negative for rash Respiratory: negative for cough or wheezing Urologic: negative for hematuria Abdominal: negative for nausea, vomiting, diarrhea, bright red blood per rectum, melena, or hematemesis Neurologic: negative for visual changes, syncope,  or dizziness All other systems reviewed and are otherwise negative except as noted above.    Blood pressure 126/62, pulse 66, height 5\' 5"  (1.651 m), weight 126 lb (57.153 kg).  General appearance: alert and no distress Neck: no adenopathy, no carotid bruit, no JVD, supple, symmetrical, trachea midline and thyroid not enlarged, symmetric, no  tenderness/mass/nodules Lungs: clear to auscultation bilaterally Heart: regular rate and rhythm, S1, S2 normal, no murmur, click, rub or gallop Extremities: extremities normal, atraumatic, no cyanosis or edema  EKG normal sinus rhythm at 66 without ST or T-wave changes. I personally reviewed this EKG  ASSESSMENT AND PLAN:   OSA (obstructive sleep apnea) History of obstructive sleep apnea intolerant to C Pap   HTN (hypertension) History of hypertension with blood pressure measured at 126/62. She is on lisinopril and metoprolol as well as spironolactone. Continue current meds at current dosing   Atrial fibrillation History of paroxysmal atrial fibrillation maintaining sinus rhythm on Coumadin anticoagulation.       Regina Harp MD [place], [facility] 01/09/2015 11:33 AM Regina Harp MD FACP,FACC,FAHA, [facility]

## 2015-01-09 NOTE — Patient Instructions (Signed)
Your physician wants you to follow-up in: 1 year with Dr Berry. You will receive a reminder letter in the mail two months in advance. If you don't receive a letter, please call our office to schedule the follow-up appointment.  

## 2015-01-09 NOTE — Assessment & Plan Note (Signed)
History of hypertension with blood pressure measured at 126/62. She is on lisinopril and metoprolol as well as spironolactone. Continue current meds at current dosing

## 2015-01-09 NOTE — Assessment & Plan Note (Signed)
History of paroxysmal atrial fibrillation maintaining sinus rhythm on Coumadin anticoagulation. 

## 2015-01-09 NOTE — Assessment & Plan Note (Signed)
History of obstructive sleep apnea intolerant to C Pap

## 2015-02-12 ENCOUNTER — Other Ambulatory Visit: Payer: Self-pay | Admitting: Internal Medicine

## 2015-02-25 LAB — HM DIABETES EYE EXAM

## 2015-02-28 ENCOUNTER — Other Ambulatory Visit: Payer: Self-pay | Admitting: *Deleted

## 2015-03-22 ENCOUNTER — Other Ambulatory Visit: Payer: PRIVATE HEALTH INSURANCE

## 2015-03-25 ENCOUNTER — Other Ambulatory Visit: Payer: Self-pay | Admitting: Pharmacist Clinician (PhC)/ Clinical Pharmacy Specialist

## 2015-03-25 NOTE — Telephone Encounter (Signed)
Daughter-in-law called in stating that a new prescription is needed for her Warfarin. Please f/u  Thanks

## 2015-03-26 ENCOUNTER — Telehealth: Payer: Self-pay | Admitting: Pharmacist Clinician (PhC)/ Clinical Pharmacy Specialist

## 2015-03-26 NOTE — Telephone Encounter (Signed)
Closed encounter °

## 2015-03-27 ENCOUNTER — Ambulatory Visit: Payer: Medicare Other | Admitting: Internal Medicine

## 2015-03-28 ENCOUNTER — Other Ambulatory Visit: Payer: Medicare Other

## 2015-03-28 DIAGNOSIS — I10 Essential (primary) hypertension: Secondary | ICD-10-CM

## 2015-03-28 DIAGNOSIS — E039 Hypothyroidism, unspecified: Secondary | ICD-10-CM

## 2015-03-28 DIAGNOSIS — E1165 Type 2 diabetes mellitus with hyperglycemia: Secondary | ICD-10-CM

## 2015-03-29 LAB — COMPREHENSIVE METABOLIC PANEL
A/G RATIO: 1.8 (ref 1.1–2.5)
ALBUMIN: 4.6 g/dL (ref 3.5–4.7)
ALK PHOS: 107 IU/L (ref 39–117)
ALT: 11 IU/L (ref 0–32)
AST: 20 IU/L (ref 0–40)
BUN/Creatinine Ratio: 24 (ref 11–26)
BUN: 24 mg/dL (ref 8–27)
Bilirubin Total: 0.5 mg/dL (ref 0.0–1.2)
CO2: 24 mmol/L (ref 18–29)
CREATININE: 1 mg/dL (ref 0.57–1.00)
Calcium: 10.3 mg/dL (ref 8.7–10.3)
Chloride: 99 mmol/L (ref 97–108)
GFR, EST AFRICAN AMERICAN: 59 mL/min/{1.73_m2} — AB (ref >59)
GFR, EST NON AFRICAN AMERICAN: 51 mL/min/{1.73_m2} — AB (ref >59)
GLUCOSE: 88 mg/dL (ref 65–99)
Globulin, Total: 2.6 g/dL (ref 1.5–4.5)
Potassium: 4.6 mmol/L (ref 3.5–5.2)
Sodium: 140 mmol/L (ref 134–144)
TOTAL PROTEIN: 7.2 g/dL (ref 6.0–8.5)

## 2015-03-29 LAB — HEMOGLOBIN A1C
Est. average glucose Bld gHb Est-mCnc: 137 mg/dL
HEMOGLOBIN A1C: 6.4 % — AB (ref 4.8–5.6)

## 2015-03-29 LAB — TSH: TSH: 2.95 u[IU]/mL (ref 0.450–4.500)

## 2015-04-01 ENCOUNTER — Ambulatory Visit (INDEPENDENT_AMBULATORY_CARE_PROVIDER_SITE_OTHER): Payer: Medicare Other | Admitting: Pharmacist Clinician (PhC)/ Clinical Pharmacy Specialist

## 2015-04-01 DIAGNOSIS — I4891 Unspecified atrial fibrillation: Secondary | ICD-10-CM

## 2015-04-01 DIAGNOSIS — I480 Paroxysmal atrial fibrillation: Secondary | ICD-10-CM

## 2015-04-01 DIAGNOSIS — Z7901 Long term (current) use of anticoagulants: Secondary | ICD-10-CM

## 2015-04-01 LAB — POCT INR: INR: 2

## 2015-04-03 ENCOUNTER — Ambulatory Visit (INDEPENDENT_AMBULATORY_CARE_PROVIDER_SITE_OTHER): Payer: Medicare Other | Admitting: Internal Medicine

## 2015-04-03 ENCOUNTER — Encounter: Payer: Self-pay | Admitting: Internal Medicine

## 2015-04-03 VITALS — BP 118/70 | HR 71 | Temp 97.6°F | Ht 65.0 in | Wt 126.2 lb

## 2015-04-03 DIAGNOSIS — R413 Other amnesia: Secondary | ICD-10-CM | POA: Insufficient documentation

## 2015-04-03 DIAGNOSIS — E039 Hypothyroidism, unspecified: Secondary | ICD-10-CM | POA: Diagnosis not present

## 2015-04-03 DIAGNOSIS — Z7901 Long term (current) use of anticoagulants: Secondary | ICD-10-CM

## 2015-04-03 DIAGNOSIS — I10 Essential (primary) hypertension: Secondary | ICD-10-CM

## 2015-04-03 DIAGNOSIS — I6789 Other cerebrovascular disease: Secondary | ICD-10-CM

## 2015-04-03 DIAGNOSIS — R42 Dizziness and giddiness: Secondary | ICD-10-CM

## 2015-04-03 DIAGNOSIS — R269 Unspecified abnormalities of gait and mobility: Secondary | ICD-10-CM | POA: Diagnosis not present

## 2015-04-03 DIAGNOSIS — I679 Cerebrovascular disease, unspecified: Secondary | ICD-10-CM

## 2015-04-03 DIAGNOSIS — M546 Pain in thoracic spine: Secondary | ICD-10-CM | POA: Diagnosis not present

## 2015-04-03 DIAGNOSIS — M2041 Other hammer toe(s) (acquired), right foot: Secondary | ICD-10-CM

## 2015-04-03 DIAGNOSIS — E1165 Type 2 diabetes mellitus with hyperglycemia: Secondary | ICD-10-CM

## 2015-04-03 DIAGNOSIS — I480 Paroxysmal atrial fibrillation: Secondary | ICD-10-CM | POA: Diagnosis not present

## 2015-04-03 NOTE — Progress Notes (Signed)
Failed Clock Drawing given by Rodena Piety May,CMA

## 2015-04-03 NOTE — Progress Notes (Signed)
Patient ID: [name], female   DOB: May 02, 1925, 81 y.o.   MRN: [id number]    Facility  [facility]    Place of Service:   OFFICE    No Known Allergies  Chief Complaint  Patient presents with  . Medical Management of Chronic Issues    4 Month follow up.MMSE Performed 22/30    HPI:  Memory deficit: MMSE today was 22/30. Failed clock drawing. Patient has an attendant for assistance every day.  Type 2 diabetes mellitus with hyperglycemia - controlled Essential hypertension - Plan: Comprehensive metabolic panel  Hypothyroidism, unspecified hypothyroidism type - compensated  Ill-defined cerebrovascular disease: Likely cause of her progressive dementia  Hammer toe of right foot: Second toe has a callus buildup at the proximal interphalangeal joint secondary to the hammertoe and pressure of her shoes on this joint.  Abnormality of gait: At risk for falls. Unstable when attempting to walk.  Dizzy: Improved  Long term current use of anticoagulant therapy: Therapeutic and used for history of paroxysmal atrial fibrillation  Paroxysmal atrial fibrillation: Currently in normal sinus rhythm  Midline thoracic back pain: Chronic discomfort which does not appear to be disabling.    Medications: Patient's Medications  New Prescriptions   No medications on file  Previous Medications   ESTRADIOL (ESTRACE) 0.5 MG TABLET    TAKE 1 TABLET EVERY DAY FOR HORMONES   HYDROXYPROPYL METHYLCELLULOSE (ISOPTO TEARS) 2.5 % OPHTHALMIC SOLUTION    Place 1 drop into both eyes as needed for dry eyes.   LEVOTHYROXINE (SYNTHROID, LEVOTHROID) 50 MCG TABLET    TAKE 1 TABLET ON WEDNESDAY THROUGH FRIDAY FOR THYROID SUPPLEMENT   LEVOTHYROXINE (SYNTHROID, LEVOTHROID) 75 MCG TABLET    TAKE 1 TABLET BY MOUTH ON MONDAY,TUESDAY,THURSDAY,SATURDAY AND SUNDAY   LISINOPRIL (PRINIVIL,ZESTRIL) 5 MG TABLET    TAKE 1 TABLET BY MOUTH DAILY.   METFORMIN (GLUCOPHAGE) 500 MG TABLET    TAKE 2 TABLETS BY MOUTH EVERY MORNING AND 1  TAB AT SUPPER TO CONTROL BLOOD SUGAR   METOPROLOL (LOPRESSOR) 50 MG TABLET    TAKE 1 TABLET BY MOUTH TWICE A DAY FOR BLOOD PRESSURE   SPIRONOLACTONE (ALDACTONE) 25 MG TABLET    TAKE 1 TABLET BY MOUTH TWICE A DAY TO STRENGTHEN THE HEART AND REGULATE BLOOD PRESSURE   WARFARIN (COUMADIN) 7.5 MG TABLET    Take 1 tablet by mouth daily or as directed by coumadin clinic.  Need INR check for further refills  Modified Medications   No medications on file  Discontinued Medications   LEVOTHYROXINE (SYNTHROID, LEVOTHROID) 50 MCG TABLET    Take 50 mcg by mouth daily before breakfast. Wednesday and Friday only   LEVOTHYROXINE (SYNTHROID, LEVOTHROID) 75 MCG TABLET    TAKE 1 TABLET BY MOUTH ON MONDAY,TUESDAY,THURSDAY,SATURDAY AND SUNDAY   WARFARIN (COUMADIN) 7.5 MG TABLET    Take 1 tablet by mouth daily or as directed by coumadin clinic     Review of Systems  Constitutional: Positive for fatigue.  Eyes: Negative.   Respiratory: Positive for shortness of breath.   Cardiovascular: Positive for palpitations. Negative for chest pain and leg swelling.  Gastrointestinal: Positive for diarrhea. Negative for nausea, abdominal pain and abdominal distention.       Incontinent of stool sometimes  Endocrine:       History of thyroid problems and diabetes.  Genitourinary: Negative.        Diarrhea. No cramps. No blood in stool.  Musculoskeletal: Positive for arthralgias and gait problem.  Sometimes she feels like she could fall over when she walks through her yard. Mid thoracic back pains.  Neurological: Positive for weakness.       Mild memory deficits.  Hematological: Negative.   Psychiatric/Behavioral: Negative.     Filed Vitals:   04/03/15 1248  BP: 118/70  Pulse: 71  Temp: 97.6 F (36.4 C)  TempSrc: Oral  Height: 5\' 5"  (1.651 m)  Weight: 126 lb 3.2 oz (57.244 kg)   Body mass index is 21 kg/(m^2).  Physical Exam  Constitutional: She is oriented to person, place, and time. No distress.    Frail elderly female  HENT:  Head: Normocephalic and atraumatic.  Nose: Nose normal.  Mouth/Throat: No oropharyngeal exudate.  Eyes: Conjunctivae and EOM are normal. Pupils are equal, round, and reactive to light.  Prescription lenses  Neck: Normal range of motion. Neck supple. No JVD present. No tracheal deviation present. No thyromegaly present.  Chronically hoarse.  Cardiovascular: Normal rate, regular rhythm, normal heart sounds and intact distal pulses.  Exam reveals no gallop and no friction rub.   No murmur heard. Pulmonary/Chest: Effort normal and breath sounds normal. No respiratory distress. She has no wheezes. She has no rales.  Abdominal: Soft. Bowel sounds are normal. She exhibits no distension and no mass. There is no tenderness.  Musculoskeletal: She exhibits no tenderness.  Reduced extension and flexion of the right knee. Heberden's nodes of the hands. Hammer toe on the right foot 2nd toe. No focal back pain on percussion and oalpation.  Lymphadenopathy:    She has no cervical adenopathy.  Neurological: She is alert and oriented to person, place, and time. No cranial nerve deficit.  Slow and sometimes vague responses to questions Diminished vibratory sensation in the great toes.  Skin: Skin is warm and dry. No rash noted. No erythema. No pallor.  Psychiatric: She has a normal mood and affect. Her behavior is normal. Thought content normal.     Labs reviewed: Anti-coag visit on 04/01/2015  Component Date Value Ref Range Status  . INR 04/01/2015 2   Final  Appointment on 03/28/2015  Component Date Value Ref Range Status  . TSH 03/28/2015 2.950  0.450 - 4.500 uIU/mL Final  . Glucose 03/28/2015 88  65 - 99 mg/dL Final  . BUN 03/28/2015 24  8 - 27 mg/dL Final  . Creatinine, Ser 03/28/2015 1.00  0.57 - 1.00 mg/dL Final  . GFR calc non Af Amer 03/28/2015 51* >59 mL/min/1.73 Final  . GFR calc Af Amer 03/28/2015 59* >59 mL/min/1.73 Final  . BUN/Creatinine Ratio  03/28/2015 24  11 - 26 Final  . Sodium 03/28/2015 140  134 - 144 mmol/L Final  . Potassium 03/28/2015 4.6  3.5 - 5.2 mmol/L Final  . Chloride 03/28/2015 99  97 - 108 mmol/L Final  . CO2 03/28/2015 24  18 - 29 mmol/L Final  . Calcium 03/28/2015 10.3  8.7 - 10.3 mg/dL Final  . Total Protein 03/28/2015 7.2  6.0 - 8.5 g/dL Final  . Albumin 03/28/2015 4.6  3.5 - 4.7 g/dL Final  . Globulin, Total 03/28/2015 2.6  1.5 - 4.5 g/dL Final  . Albumin/Globulin Ratio 03/28/2015 1.8  1.1 - 2.5 Final  . Bilirubin Total 03/28/2015 0.5  0.0 - 1.2 mg/dL Final  . Alkaline Phosphatase 03/28/2015 107  39 - 117 IU/L Final  . AST 03/28/2015 20  0 - 40 IU/L Final  . ALT 03/28/2015 11  0 - 32 IU/L Final  . Hgb A1c MFr  Bld 03/28/2015 6.4* 4.8 - 5.6 % Final   Comment:          Pre-diabetes: 5.7 - 6.4          Diabetes: >6.4          Glycemic control for adults with diabetes: <7.0   . Est. average glucose Bld gHb Est-m* 03/28/2015 137   Final  Lab on 02/28/2015  Component Date Value Ref Range Status  . HM Diabetic Eye Exam 02/25/2015 No Retinopathy  No Retinopathy Final  Anti-coag visit on 01/09/2015  Component Date Value Ref Range Status  . INR 01/09/2015 1.8   Final     Assessment/Plan  1. Memory deficit I believe this is secondary to cerebrovascular disease. Alzheimer's disease could be present as well. She is untreated in regards to medications  2. Type 2 diabetes mellitus with hyperglycemia Controlled - Comprehensive metabolic panel; Future - Hemoglobin A1c; Future - Microalbumin, urine; Future  3. Essential hypertension - Comprehensive metabolic panel; Future  4. Hypothyroidism, unspecified hypothyroidism type - TSH; Future  5. Ill-defined cerebrovascular disease Observe   6. Hammer toe of right foot Discussed potential referral to podiatrist for straightening of the hammertoe. She does not want to do this at the present time: Warned of potential for infection and loss of the toe.  7.  Abnormality of gait Continue careful ambulation area  8. Dizzy Improved  9. Long term current use of anticoagulant therapy Continue due to history of paroxysmal atrial fibrillation  10. Paroxysmal atrial fibrillation No recent episodes  11. Midline thoracic back pain Mild to moderate discomfort between the shoulder blades. Discussed regular exercises for stretching.

## 2015-04-15 ENCOUNTER — Ambulatory Visit (INDEPENDENT_AMBULATORY_CARE_PROVIDER_SITE_OTHER): Payer: Medicare Other | Admitting: Pharmacist Clinician (PhC)/ Clinical Pharmacy Specialist

## 2015-04-15 DIAGNOSIS — I4891 Unspecified atrial fibrillation: Secondary | ICD-10-CM | POA: Diagnosis not present

## 2015-04-15 DIAGNOSIS — Z7901 Long term (current) use of anticoagulants: Secondary | ICD-10-CM

## 2015-04-15 LAB — POCT INR: INR: 1.6

## 2015-04-20 ENCOUNTER — Other Ambulatory Visit: Payer: Self-pay | Admitting: Internal Medicine

## 2015-05-03 ENCOUNTER — Ambulatory Visit (INDEPENDENT_AMBULATORY_CARE_PROVIDER_SITE_OTHER): Payer: Medicare Other | Admitting: Pharmacist Clinician (PhC)/ Clinical Pharmacy Specialist

## 2015-05-03 ENCOUNTER — Other Ambulatory Visit: Payer: Self-pay | Admitting: Pharmacist Clinician (PhC)/ Clinical Pharmacy Specialist

## 2015-05-03 DIAGNOSIS — Z7901 Long term (current) use of anticoagulants: Secondary | ICD-10-CM

## 2015-05-03 DIAGNOSIS — I4891 Unspecified atrial fibrillation: Secondary | ICD-10-CM | POA: Diagnosis not present

## 2015-05-03 LAB — POCT INR: INR: 1.9

## 2015-05-03 MED ORDER — WARFARIN SODIUM 5 MG PO TABS: ORAL_TABLET | ORAL | Status: DC

## 2015-05-17 ENCOUNTER — Ambulatory Visit: Payer: PRIVATE HEALTH INSURANCE | Admitting: Pharmacist Clinician (PhC)/ Clinical Pharmacy Specialist

## 2015-05-20 ENCOUNTER — Other Ambulatory Visit: Payer: Self-pay | Admitting: Internal Medicine

## 2015-05-20 ENCOUNTER — Inpatient Hospital Stay (HOSPITAL_COMMUNITY)
Admission: EM | Admit: 2015-05-20 | Discharge: 2015-05-30 | DRG: 871 | Disposition: A | Discharge status: Home Health | Payer: Medicare Other | Attending: Internal Medicine | Admitting: Internal Medicine

## 2015-05-20 ENCOUNTER — Emergency Department (HOSPITAL_COMMUNITY): Payer: Medicare Other

## 2015-05-20 ENCOUNTER — Encounter (HOSPITAL_COMMUNITY): Payer: Self-pay | Admitting: Emergency Medicine

## 2015-05-20 DIAGNOSIS — J9690 Respiratory failure, unspecified, unspecified whether with hypoxia or hypercapnia: Secondary | ICD-10-CM

## 2015-05-20 DIAGNOSIS — H9190 Unspecified hearing loss, unspecified ear: Secondary | ICD-10-CM | POA: Diagnosis present

## 2015-05-20 DIAGNOSIS — R188 Other ascites: Secondary | ICD-10-CM | POA: Diagnosis present

## 2015-05-20 DIAGNOSIS — F419 Anxiety disorder, unspecified: Secondary | ICD-10-CM | POA: Diagnosis present

## 2015-05-20 DIAGNOSIS — I129 Hypertensive chronic kidney disease with stage 1 through stage 4 chronic kidney disease, or unspecified chronic kidney disease: Secondary | ICD-10-CM | POA: Diagnosis present

## 2015-05-20 DIAGNOSIS — E440 Moderate protein-calorie malnutrition: Secondary | ICD-10-CM | POA: Diagnosis present

## 2015-05-20 DIAGNOSIS — I482 Chronic atrial fibrillation: Secondary | ICD-10-CM | POA: Diagnosis present

## 2015-05-20 DIAGNOSIS — Z9119 Patient's noncompliance with other medical treatment and regimen: Secondary | ICD-10-CM | POA: Diagnosis present

## 2015-05-20 DIAGNOSIS — Z978 Presence of other specified devices: Secondary | ICD-10-CM

## 2015-05-20 DIAGNOSIS — G4733 Obstructive sleep apnea (adult) (pediatric): Secondary | ICD-10-CM | POA: Diagnosis present

## 2015-05-20 DIAGNOSIS — R0600 Dyspnea, unspecified: Secondary | ICD-10-CM

## 2015-05-20 DIAGNOSIS — Z8673 Personal history of transient ischemic attack (TIA), and cerebral infarction without residual deficits: Secondary | ICD-10-CM

## 2015-05-20 DIAGNOSIS — F0390 Unspecified dementia without behavioral disturbance: Secondary | ICD-10-CM | POA: Diagnosis present

## 2015-05-20 DIAGNOSIS — E875 Hyperkalemia: Secondary | ICD-10-CM

## 2015-05-20 DIAGNOSIS — I4891 Unspecified atrial fibrillation: Secondary | ICD-10-CM | POA: Diagnosis present

## 2015-05-20 DIAGNOSIS — J90 Pleural effusion, not elsewhere classified: Secondary | ICD-10-CM | POA: Diagnosis present

## 2015-05-20 DIAGNOSIS — E861 Hypovolemia: Secondary | ICD-10-CM | POA: Diagnosis present

## 2015-05-20 DIAGNOSIS — Z6820 Body mass index (BMI) 20.0-20.9, adult: Secondary | ICD-10-CM

## 2015-05-20 DIAGNOSIS — R109 Unspecified abdominal pain: Secondary | ICD-10-CM

## 2015-05-20 DIAGNOSIS — R0602 Shortness of breath: Secondary | ICD-10-CM

## 2015-05-20 DIAGNOSIS — E785 Hyperlipidemia, unspecified: Secondary | ICD-10-CM | POA: Diagnosis present

## 2015-05-20 DIAGNOSIS — E039 Hypothyroidism, unspecified: Secondary | ICD-10-CM | POA: Diagnosis present

## 2015-05-20 DIAGNOSIS — I480 Paroxysmal atrial fibrillation: Secondary | ICD-10-CM | POA: Diagnosis present

## 2015-05-20 DIAGNOSIS — E860 Dehydration: Secondary | ICD-10-CM | POA: Diagnosis present

## 2015-05-20 DIAGNOSIS — I9589 Other hypotension: Secondary | ICD-10-CM | POA: Diagnosis present

## 2015-05-20 DIAGNOSIS — R55 Syncope and collapse: Secondary | ICD-10-CM | POA: Diagnosis not present

## 2015-05-20 DIAGNOSIS — N170 Acute kidney failure with tubular necrosis: Secondary | ICD-10-CM | POA: Diagnosis present

## 2015-05-20 DIAGNOSIS — R001 Bradycardia, unspecified: Secondary | ICD-10-CM | POA: Diagnosis present

## 2015-05-20 DIAGNOSIS — E8770 Fluid overload, unspecified: Secondary | ICD-10-CM | POA: Diagnosis present

## 2015-05-20 DIAGNOSIS — T45515A Adverse effect of anticoagulants, initial encounter: Secondary | ICD-10-CM | POA: Diagnosis present

## 2015-05-20 DIAGNOSIS — J9601 Acute respiratory failure with hypoxia: Secondary | ICD-10-CM | POA: Diagnosis present

## 2015-05-20 DIAGNOSIS — G9340 Encephalopathy, unspecified: Secondary | ICD-10-CM

## 2015-05-20 DIAGNOSIS — A419 Sepsis, unspecified organism: Principal | ICD-10-CM | POA: Diagnosis present

## 2015-05-20 DIAGNOSIS — N390 Urinary tract infection, site not specified: Secondary | ICD-10-CM | POA: Diagnosis present

## 2015-05-20 DIAGNOSIS — R197 Diarrhea, unspecified: Secondary | ICD-10-CM | POA: Diagnosis present

## 2015-05-20 DIAGNOSIS — Z4659 Encounter for fitting and adjustment of other gastrointestinal appliance and device: Secondary | ICD-10-CM

## 2015-05-20 DIAGNOSIS — IMO0002 Reserved for concepts with insufficient information to code with codable children: Secondary | ICD-10-CM | POA: Diagnosis present

## 2015-05-20 DIAGNOSIS — E1165 Type 2 diabetes mellitus with hyperglycemia: Secondary | ICD-10-CM | POA: Diagnosis present

## 2015-05-20 DIAGNOSIS — K625 Hemorrhage of anus and rectum: Secondary | ICD-10-CM | POA: Diagnosis present

## 2015-05-20 DIAGNOSIS — N182 Chronic kidney disease, stage 2 (mild): Secondary | ICD-10-CM | POA: Diagnosis present

## 2015-05-20 DIAGNOSIS — E872 Acidosis: Secondary | ICD-10-CM | POA: Diagnosis present

## 2015-05-20 DIAGNOSIS — N200 Calculus of kidney: Secondary | ICD-10-CM | POA: Diagnosis present

## 2015-05-20 DIAGNOSIS — N19 Unspecified kidney failure: Secondary | ICD-10-CM

## 2015-05-20 DIAGNOSIS — K5730 Diverticulosis of large intestine without perforation or abscess without bleeding: Secondary | ICD-10-CM | POA: Diagnosis present

## 2015-05-20 DIAGNOSIS — E876 Hypokalemia: Secondary | ICD-10-CM | POA: Diagnosis present

## 2015-05-20 DIAGNOSIS — Z7901 Long term (current) use of anticoagulants: Secondary | ICD-10-CM

## 2015-05-20 DIAGNOSIS — N179 Acute kidney failure, unspecified: Secondary | ICD-10-CM

## 2015-05-20 DIAGNOSIS — D689 Coagulation defect, unspecified: Secondary | ICD-10-CM | POA: Diagnosis present

## 2015-05-20 DIAGNOSIS — I252 Old myocardial infarction: Secondary | ICD-10-CM

## 2015-05-20 LAB — CBC WITH DIFFERENTIAL/PLATELET
Basophils Absolute: 0 10*3/uL (ref 0.0–0.1)
Basophils Relative: 0 % (ref 0–1)
EOS PCT: 0 % (ref 0–5)
Eosinophils Absolute: 0 10*3/uL (ref 0.0–0.7)
HCT: 30.9 % — ABNORMAL LOW (ref 36.0–46.0)
HEMOGLOBIN: 10.8 g/dL — AB (ref 12.0–15.0)
Lymphocytes Relative: 19 % (ref 12–46)
Lymphs Abs: 2.4 10*3/uL (ref 0.7–4.0)
MCH: 33.2 pg (ref 26.0–34.0)
MCHC: 35 g/dL (ref 30.0–36.0)
MCV: 95.1 fL (ref 78.0–100.0)
MONO ABS: 1.3 10*3/uL — AB (ref 0.1–1.0)
Monocytes Relative: 10 % (ref 3–12)
Neutro Abs: 8.9 10*3/uL — ABNORMAL HIGH (ref 1.7–7.7)
Neutrophils Relative %: 71 % (ref 43–77)
Platelets: 265 10*3/uL (ref 150–400)
RBC: 3.25 MIL/uL — AB (ref 3.87–5.11)
RDW: 12.8 % (ref 11.5–15.5)
WBC: 12.7 10*3/uL — ABNORMAL HIGH (ref 4.0–10.5)

## 2015-05-20 LAB — POC OCCULT BLOOD, ED: Fecal Occult Bld: POSITIVE — AB

## 2015-05-20 LAB — I-STAT CG4 LACTIC ACID, ED: Lactic Acid, Venous: 6.77 mmol/L (ref 0.5–2.0)

## 2015-05-20 MED ORDER — SODIUM CHLORIDE 0.9 % IV SOLN
1000.0000 mL | INTRAVENOUS | Status: DC
  Administered 2015-05-21: 1000 mL via INTRAVENOUS

## 2015-05-20 MED ORDER — GLUCAGON HCL RDNA (DIAGNOSTIC) 1 MG IJ SOLR: 1.0000 mg | Freq: Once | INTRAMUSCULAR | Status: DC

## 2015-05-20 MED ORDER — STERILE WATER FOR INJECTION IJ SOLN
INTRAMUSCULAR | Status: AC
  Filled 2015-05-20: qty 10

## 2015-05-20 MED ORDER — ONDANSETRON HCL 4 MG/2ML IJ SOLN
4.0000 mg | Freq: Once | INTRAMUSCULAR | Status: AC
  Administered 2015-05-20: 4 mg via INTRAVENOUS
  Filled 2015-05-20: qty 2

## 2015-05-20 MED ORDER — SODIUM CHLORIDE 0.9 % IV SOLN
1000.0000 mL | Freq: Once | INTRAVENOUS | Status: AC
  Administered 2015-05-20: 1000 mL via INTRAVENOUS

## 2015-05-20 MED ORDER — GLUCAGON HCL RDNA (DIAGNOSTIC) 1 MG IJ SOLR
1.0000 mg | Freq: Once | INTRAMUSCULAR | Status: AC
  Administered 2015-05-20: 1 mg via INTRAVENOUS
  Filled 2015-05-20: qty 1

## 2015-05-20 NOTE — ED Provider Notes (Signed)
CSN: [id number]     Arrival date & time 05/20/15  2258 History   First MD Initiated Contact with Patient 05/20/15 2301     This chart was scribed for Seniyah Esker, MD by Forrestine Him, ED Scribe. This patient was seen in room [facility] and the patient's care was started 11:04 PM.   Chief Complaint  Patient presents with  . Hypotension   Patient is a 81 y.o. female presenting with syncope. The history is provided by the EMS personnel. The history is limited by the condition of the patient (level 5 caveat). No language interpreter was used.  Loss of Consciousness Episode history:  Single Most recent episode:  Today Timing:  Constant Progression:  Resolved Chronicity:  New Context: not blood draw   Relieved by:  Nothing Worsened by:  Nothing tried Ineffective treatments:  None tried Associated symptoms: no anxiety, no fever and no weakness   Risk factors: no seizures      LEVEL 5 CAVEAT DUE TO DEMENTIA  HPI Comments: [name] brought in by EMS from home is a 81 y.o. female with a PMHx of A-Fib, MI, DM, HTN, hyperlipidemia, and dementia who presents to the Emergency Department here for hypotension this evening. Bradycardia also noted en route to department. Large bowel movement prior to arrival followed by a short episode of syncope. Ongoing diarrhea noted en route to ED. No known allergies to medications.   Past Medical History  Diagnosis Date  . OSA (obstructive sleep apnea)     AHI-44/hr, AHI during REM 61.8/hr, avg O2 during REM and NREM 96%  . Atrial fibrillation   . Myocardial infarct   . Diabetes mellitus   . Hearing loss   . Hypertension   . Hyperlipidemia   . Senile degeneration of brain   . Other generalized ischemic cerebrovascular disease   . Personal history of fall   . Pain in joint, shoulder region   . 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 Loss of weight   . Chronic kidney disease, stage II (mild)   . Obstructive sleep apnea  (adult) (pediatric)   . Senile degeneration of brain   . Dizziness and giddiness   . Personal history of noncompliance with medical treatment, presenting hazards to health   . Dyskinesia of esophagus   . Shortness of breath   . Chest pain, unspecified 06/19/2009  . Long term (current) use of anticoagulants   . Unspecified hereditary and idiopathic peripheral neuropathy   . Pain in joint, site unspecified   . Carpal tunnel syndrome   . Other hammer toe (acquired)   . Alopecia areata   . Dysphagia, unspecified(787.20)   . Occlusion and stenosis of carotid artery without mention of cerebral infarction   . Other symptoms involving cardiovascular system   . Other acne   . Allergic rhinitis, cause unspecified   . Type II or unspecified type diabetes mellitus without mention of complication, uncontrolled   . Varicose veins of lower extremities   . Cervicalgia   . Disturbance of salivary secretion   . Insomnia, unspecified   . Unspecified hypothyroidism   . Palpitations 715.90  . Other malaise and fatigue   . Diverticulosis of colon (without mention of hemorrhage)   . Benign neoplasm of colon   . Depressive disorder, not elsewhere classified   . Syncope and collapse   . Paroxysmal atrial fibrillation   . Hypertension   . Dizziness    Past Surgical History  Procedure  Laterality Date  . Total abdominal hysterectomy    . Tonsillectomy    . Appendectomy    . Hand surgery    . Cataract od  10/04/2012  . Cataract os  01/31/2013  . Carotid doppler  07/25/2012    Bilat internal carotid arteries demonstrate 1-395 stenoses.  . Transthoracic echocardiogram  04/24/2011    EF >55%, moderate tricuspid regurg.   Family History  Problem Relation Age of Onset  . Cervical cancer Mother   . Cancer Mother   . Parkinsonism Father   . Sleep apnea Brother   . Diabetes Brother   . Sleep apnea Brother   . Peripheral vascular disease Brother   . Cancer Daughter    History  Substance Use Topics   . Smoking status: Never Smoker   . Smokeless tobacco: Not on file  . Alcohol Use: No   OB History    No data available     Review of Systems  Unable to perform ROS Constitutional: Negative for fever.  Cardiovascular: Positive for syncope.  Gastrointestinal: Positive for anal bleeding.  Neurological: Negative for weakness.      Allergies  Review of patient's allergies indicates no known allergies.  Home Medications   Prior to Admission medications   Medication Sig Start Date End Date Taking? Authorizing Provider  estradiol (ESTRACE) 0.5 MG tablet TAKE 1 TABLET EVERY DAY FOR HORMONES 11/20/14   Estill Dooms, MD  hydroxypropyl methylcellulose (ISOPTO TEARS) 2.5 % ophthalmic solution Place 1 drop into both eyes as needed for dry eyes.    Historical Provider, MD  levothyroxine (SYNTHROID, LEVOTHROID) 50 MCG tablet TAKE 1 TABLET ON [facility] THROUGH FRIDAY FOR THYROID SUPPLEMENT 11/26/14   Lauree Chandler, NP  levothyroxine (SYNTHROID, LEVOTHROID) 75 MCG tablet TAKE 1 TABLET BY MOUTH ON MONDAY,TUESDAY,THURSDAY,SATURDAY AND SUNDAY 02/13/15   Estill Dooms, MD  lisinopril (PRINIVIL,ZESTRIL) 5 MG tablet TAKE 1 TABLET BY MOUTH DAILY. 11/21/14   Lorretta Harp, MD  metFORMIN (GLUCOPHAGE) 500 MG tablet TAKE 2 TABLETS BY MOUTH EVERY MORNING AND 1 TAB AT SUPPER TO CONTROL BLOOD SUGAR 05/20/15   Estill Dooms, MD  metoprolol (LOPRESSOR) 50 MG tablet TAKE 1 TABLET BY MOUTH TWICE A DAY FOR BLOOD PRESSURE 04/22/15   Estill Dooms, MD  spironolactone (ALDACTONE) 25 MG tablet TAKE 1 TABLET BY MOUTH TWICE A DAY TO STRENGTHEN THE HEART AND REGULATE BLOOD PRESSURE 12/25/14   Estill Dooms, MD  warfarin (COUMADIN) 5 MG tablet Take 1 to 1.5 tablets by mouth daily as directed by coumadin clinic 05/03/15   Lorretta Harp, MD   Triage Vitals: BP 93/41 mmHg  Pulse 68  Resp 16  Ht 5\' 4"  (1.626 m)  Wt 124 lb (56.246 kg)  BMI 21.27 kg/m2  SpO2 98%   Physical Exam  Constitutional: She appears  well-developed and well-nourished. No distress.  HENT:  Head: Normocephalic and atraumatic.  Mouth/Throat: Oropharynx is clear and moist.  Trachea is midline  Eyes: Conjunctivae and EOM are normal. Pupils are equal, round, and reactive to light.  Neck: Normal range of motion. Neck supple.  Cardiovascular: Regular rhythm, normal heart sounds and intact distal pulses.  Bradycardia present.   Pulmonary/Chest: Effort normal and breath sounds normal. She has no wheezes. She has no rales.  Slightly diminished breath sounds   Abdominal: Soft. She exhibits no distension. There is no tenderness. There is no rebound and no guarding.  Hyperactive bowel sounds noted  Musculoskeletal: Normal range of motion. She  exhibits no edema or tenderness.  Neurological: She is alert. She has normal reflexes. No cranial nerve deficit.  Skin: Skin is warm and dry.  Psychiatric: She has a normal mood and affect. Judgment normal.  Nursing note and vitals reviewed.   ED Course  Procedures (including critical care time)  DIAGNOSTIC STUDIES: Oxygen Saturation is 98% on RA, Normal by my interpretation.    COORDINATION OF CARE: 11:05 PM- Will order CXR, BMP, I-stat CG4 lactic acid, troponin I, PT-INR, and EKG. Will give fluids, Glucagon, and Zofran. Discussed treatment plan with pt at bedside and pt agreed to plan.     Labs Review Labs Reviewed  BASIC METABOLIC PANEL - Abnormal; Notable for the following:    Sodium 128 (*)    Potassium 7.1 (*)    Chloride 94 (*)    CO2 12 (*)    Glucose, Bld 154 (*)    BUN 72 (*)    Creatinine, Ser 7.88 (*)    Calcium 8.0 (*)    GFR calc non Af Amer 4 (*)    GFR calc Af Amer 5 (*)    Anion gap 22 (*)    All other components within normal limits  CBC WITH DIFFERENTIAL/PLATELET - Abnormal; Notable for the following:    WBC 12.7 (*)    RBC 3.25 (*)    Hemoglobin 10.8 (*)    HCT 30.9 (*)    Neutro Abs 8.9 (*)    Monocytes Absolute 1.3 (*)    All other components within  normal limits  PROTIME-INR - Abnormal; Notable for the following:    Prothrombin Time 46.0 (*)    All other components within normal limits  I-STAT CG4 LACTIC ACID, ED - Abnormal; Notable for the following:    Lactic Acid, Venous 6.77 (*)    All other components within normal limits  POC OCCULT BLOOD, ED - Abnormal; Notable for the following:    Fecal Occult Bld POSITIVE (*)    All other components within normal limits  TROPONIN I  POTASSIUM  URINALYSIS, ROUTINE W REFLEX MICROSCOPIC (NOT AT [facility])  POC OCCULT BLOOD, ED  I-STAT CHEM 8, ED  TYPE AND SCREEN    Imaging Review Dg Chest Portable 1 View  05/20/2015   CLINICAL DATA:  Chest pain, hypotension, syncope  EXAM: PORTABLE CHEST - 1 VIEW  COMPARISON:  04/04/2014  FINDINGS: The heart size and mediastinal contours are within normal limits. Both lungs are clear. The visualized skeletal structures are unremarkable.  IMPRESSION: No active disease.   Electronically Signed   By: Lucienne Capers M.D.   On: 05/20/2015 23:35   Dg Abd Portable 1v  05/21/2015   CLINICAL DATA:  Chest and abdominal pain. Shortness of breath. Hypotension.  EXAM: PORTABLE ABDOMEN - 1 VIEW  COMPARISON:  01/07/2011  FINDINGS: The right abdomen and low pelvis are not included within the field of view. Superimposed structures obscure visualization of some of the left upper quadrant. Visualized bowel gas pattern is normal without significant colonic or small bowel distention. No radiopaque stones. Degenerative changes in the spine.  IMPRESSION: Limited study.  No evidence of bowel obstruction.   Electronically Signed   By: Lucienne Capers M.D.   On: 05/21/2015 00:00     EKG Interpretation   Date/Time:  Monday May 20 2015 23:30:59 EDT Ventricular Rate:  74 PR Interval:  199 QRS Duration: 92 QT Interval:  434 QTC Calculation: 481 R Axis:   6 Text Interpretation:  Sinus rhythm Confirmed  by [facility]  MD, Seabron Iannello  ([id number]) on 05/21/2015 12:06:18 AM      MDM    Final diagnoses:  None    MDM Number of Diagnoses or Management Options Anticoagulated on Coumadin:  Hyperkalemia:  Renal failure:  Syncope and collapse:  Critical Care Total time providing critical care: > 105 minutes MDM Reviewed: previous chart, nursing note and vitals Interpretation: labs, ECG and x-ray (K 7.1 and 6.7 cr 7.7 cxr NACPD ) Total time providing critical care: > 105 minutes. This excludes time spent performing separately reportable procedures and services. Consults: critical care and admitting MD (declined by PCCM give fluids)   Medications  0.9 %  sodium chloride infusion (0 mLs Intravenous Stopped 05/21/15 0106)    Followed by  0.9 %  sodium chloride infusion (1,000 mLs Intravenous New Bag/Given 05/21/15 0106)  pantoprazole (PROTONIX) 80 mg in sodium chloride 0.9 % 100 mL IVPB (80 mg Intravenous New Bag/Given 05/21/15 0149)  glucagon (human recombinant) (GLUCAGEN) injection 1 mg (1 mg Intravenous Given 05/20/15 2316)  ondansetron (ZOFRAN) injection 4 mg (4 mg Intravenous Given 05/20/15 2310)  albuterol (PROVENTIL) (2.5 MG/3ML) 0.083% nebulizer solution 10 mg (10 mg Nebulization Given 05/21/15 0048)  insulin aspart (novoLOG) injection 10 Units (10 Units Intravenous Given 05/21/15 0107)  dextrose 50 % solution 50 mL (50 mLs Intravenous Given 05/21/15 0109)  sodium bicarbonate injection 50 mEq (50 mEq Intravenous Given 05/21/15 0108)  phytonadione (VITAMIN K) SQ injection 10 mg (10 mg Subcutaneous Given 05/21/15 0111)  sodium chloride 0.9 % bolus 1,000 mL (1,000 mLs Intravenous New Bag/Given 05/21/15 0150)   CRITICAL CARE Performed by: Carlisle Beers Total critical care time: 120 minutes  Critical care time was exclusive of separately billable procedures and treating other patients. Critical care was necessary to treat or prevent imminent or life-threatening deterioration. Critical care was time spent personally by me on the following activities: development of  treatment plan with patient and/or surrogate as well as nursing, discussions with consultants, evaluation of patient's response to treatment, examination of patient, obtaining history from patient or surrogate, ordering and performing treatments and interventions, ordering and review of laboratory studies, ordering and review of radiographic studies, pulse oximetry and re-evaluation of patient's condition.   I personally performed the services described in this documentation, which was scribed in my presence. The recorded information has been reviewed and is accurate.    Veatrice Kells, MD 05/21/15 [phone]

## 2015-05-20 NOTE — ED Notes (Signed)
Duplicate order discontinued.  

## 2015-05-20 NOTE — ED Notes (Signed)
Per EMS - pt comes from home following syncopal episode.  Family reports pt has been c/o not feeling well past couple weeks and per pt, has had bloody stools x 1 month.  EMS called to scene after pt had bloody BM and became weak.  Pt passed out and had seizure-like activity - full body stiffness for 15 seconds.  Denies hx of seizures, no post-ictal activity.  Pt c/o having to go to bathroom before passing out and upon syncopal episode, had bloody BM.  Pt finished Z-Pack yesterday for cough-related illness, per family.  Vomiting x 2-3 days, no hematemesis.  Pt HR ranging from 24-54 upon EMS arrival and en route, in Atrial Fibrillation, not being paced upon arrival.  EMS VS: 101/53 after 741ml NS in 18g. LAC; 98% RA, 22 RR.  Hx A-Fib, Diabetes; pt currently taking coumadin.

## 2015-05-21 ENCOUNTER — Inpatient Hospital Stay (HOSPITAL_COMMUNITY): Payer: Medicare Other

## 2015-05-21 ENCOUNTER — Encounter (HOSPITAL_COMMUNITY): Payer: Self-pay | Admitting: Emergency Medicine

## 2015-05-21 DIAGNOSIS — N179 Acute kidney failure, unspecified: Secondary | ICD-10-CM | POA: Diagnosis present

## 2015-05-21 DIAGNOSIS — R001 Bradycardia, unspecified: Secondary | ICD-10-CM | POA: Diagnosis present

## 2015-05-21 DIAGNOSIS — N390 Urinary tract infection, site not specified: Secondary | ICD-10-CM | POA: Diagnosis present

## 2015-05-21 DIAGNOSIS — R109 Unspecified abdominal pain: Secondary | ICD-10-CM | POA: Diagnosis present

## 2015-05-21 DIAGNOSIS — R55 Syncope and collapse: Secondary | ICD-10-CM | POA: Diagnosis present

## 2015-05-21 DIAGNOSIS — G9340 Encephalopathy, unspecified: Secondary | ICD-10-CM

## 2015-05-21 DIAGNOSIS — F419 Anxiety disorder, unspecified: Secondary | ICD-10-CM | POA: Diagnosis present

## 2015-05-21 DIAGNOSIS — E1165 Type 2 diabetes mellitus with hyperglycemia: Secondary | ICD-10-CM | POA: Diagnosis present

## 2015-05-21 DIAGNOSIS — N19 Unspecified kidney failure: Secondary | ICD-10-CM | POA: Insufficient documentation

## 2015-05-21 DIAGNOSIS — R1011 Right upper quadrant pain: Secondary | ICD-10-CM

## 2015-05-21 DIAGNOSIS — E039 Hypothyroidism, unspecified: Secondary | ICD-10-CM | POA: Diagnosis not present

## 2015-05-21 DIAGNOSIS — E119 Type 2 diabetes mellitus without complications: Secondary | ICD-10-CM | POA: Diagnosis present

## 2015-05-21 DIAGNOSIS — J90 Pleural effusion, not elsewhere classified: Secondary | ICD-10-CM | POA: Diagnosis present

## 2015-05-21 DIAGNOSIS — I482 Chronic atrial fibrillation: Secondary | ICD-10-CM | POA: Diagnosis present

## 2015-05-21 DIAGNOSIS — R1010 Upper abdominal pain, unspecified: Secondary | ICD-10-CM

## 2015-05-21 DIAGNOSIS — IMO0002 Reserved for concepts with insufficient information to code with codable children: Secondary | ICD-10-CM | POA: Diagnosis present

## 2015-05-21 DIAGNOSIS — R188 Other ascites: Secondary | ICD-10-CM | POA: Diagnosis present

## 2015-05-21 DIAGNOSIS — G4733 Obstructive sleep apnea (adult) (pediatric): Secondary | ICD-10-CM | POA: Diagnosis present

## 2015-05-21 DIAGNOSIS — E872 Acidosis: Secondary | ICD-10-CM | POA: Diagnosis present

## 2015-05-21 DIAGNOSIS — R197 Diarrhea, unspecified: Secondary | ICD-10-CM | POA: Diagnosis not present

## 2015-05-21 DIAGNOSIS — E861 Hypovolemia: Secondary | ICD-10-CM | POA: Diagnosis present

## 2015-05-21 DIAGNOSIS — Z8673 Personal history of transient ischemic attack (TIA), and cerebral infarction without residual deficits: Secondary | ICD-10-CM | POA: Diagnosis not present

## 2015-05-21 DIAGNOSIS — F0390 Unspecified dementia without behavioral disturbance: Secondary | ICD-10-CM | POA: Diagnosis present

## 2015-05-21 DIAGNOSIS — A419 Sepsis, unspecified organism: Secondary | ICD-10-CM | POA: Diagnosis present

## 2015-05-21 DIAGNOSIS — E875 Hyperkalemia: Secondary | ICD-10-CM | POA: Diagnosis present

## 2015-05-21 DIAGNOSIS — Z5181 Encounter for therapeutic drug level monitoring: Secondary | ICD-10-CM | POA: Diagnosis not present

## 2015-05-21 DIAGNOSIS — K5730 Diverticulosis of large intestine without perforation or abscess without bleeding: Secondary | ICD-10-CM | POA: Diagnosis present

## 2015-05-21 DIAGNOSIS — N200 Calculus of kidney: Secondary | ICD-10-CM | POA: Diagnosis present

## 2015-05-21 DIAGNOSIS — I252 Old myocardial infarction: Secondary | ICD-10-CM | POA: Diagnosis not present

## 2015-05-21 DIAGNOSIS — J9601 Acute respiratory failure with hypoxia: Secondary | ICD-10-CM | POA: Diagnosis present

## 2015-05-21 DIAGNOSIS — T45515A Adverse effect of anticoagulants, initial encounter: Secondary | ICD-10-CM | POA: Diagnosis present

## 2015-05-21 DIAGNOSIS — N182 Chronic kidney disease, stage 2 (mild): Secondary | ICD-10-CM | POA: Diagnosis present

## 2015-05-21 DIAGNOSIS — E440 Moderate protein-calorie malnutrition: Secondary | ICD-10-CM | POA: Diagnosis present

## 2015-05-21 DIAGNOSIS — I481 Persistent atrial fibrillation: Secondary | ICD-10-CM | POA: Diagnosis not present

## 2015-05-21 DIAGNOSIS — Z9119 Patient's noncompliance with other medical treatment and regimen: Secondary | ICD-10-CM | POA: Diagnosis present

## 2015-05-21 DIAGNOSIS — Z6820 Body mass index (BMI) 20.0-20.9, adult: Secondary | ICD-10-CM | POA: Diagnosis not present

## 2015-05-21 DIAGNOSIS — E860 Dehydration: Secondary | ICD-10-CM | POA: Diagnosis present

## 2015-05-21 DIAGNOSIS — Z7901 Long term (current) use of anticoagulants: Secondary | ICD-10-CM | POA: Diagnosis present

## 2015-05-21 DIAGNOSIS — H9190 Unspecified hearing loss, unspecified ear: Secondary | ICD-10-CM | POA: Diagnosis present

## 2015-05-21 DIAGNOSIS — I480 Paroxysmal atrial fibrillation: Secondary | ICD-10-CM | POA: Diagnosis present

## 2015-05-21 DIAGNOSIS — N170 Acute kidney failure with tubular necrosis: Secondary | ICD-10-CM

## 2015-05-21 DIAGNOSIS — E8770 Fluid overload, unspecified: Secondary | ICD-10-CM | POA: Diagnosis present

## 2015-05-21 DIAGNOSIS — E876 Hypokalemia: Secondary | ICD-10-CM | POA: Diagnosis not present

## 2015-05-21 DIAGNOSIS — I129 Hypertensive chronic kidney disease with stage 1 through stage 4 chronic kidney disease, or unspecified chronic kidney disease: Secondary | ICD-10-CM | POA: Diagnosis present

## 2015-05-21 DIAGNOSIS — E785 Hyperlipidemia, unspecified: Secondary | ICD-10-CM | POA: Diagnosis present

## 2015-05-21 LAB — COMPREHENSIVE METABOLIC PANEL
ALT: 192 U/L — AB (ref 14–54)
AST: 349 U/L — ABNORMAL HIGH (ref 15–41)
Albumin: 3 g/dL — ABNORMAL LOW (ref 3.5–5.0)
Alkaline Phosphatase: 324 U/L — ABNORMAL HIGH (ref 38–126)
Anion gap: 20 — ABNORMAL HIGH (ref 5–15)
BUN: 69 mg/dL — ABNORMAL HIGH (ref 6–20)
CALCIUM: 7.6 mg/dL — AB (ref 8.9–10.3)
CO2: 9 mmol/L — ABNORMAL LOW (ref 22–32)
Chloride: 103 mmol/L (ref 101–111)
Creatinine, Ser: 7.41 mg/dL — ABNORMAL HIGH (ref 0.44–1.00)
GFR calc non Af Amer: 4 mL/min — ABNORMAL LOW (ref >60)
GFR, EST AFRICAN AMERICAN: 5 mL/min — AB (ref >60)
GLUCOSE: 121 mg/dL — AB (ref 65–99)
POTASSIUM: 5.9 mmol/L — AB (ref 3.5–5.1)
Sodium: 132 mmol/L — ABNORMAL LOW (ref 135–145)
Total Bilirubin: 0.7 mg/dL (ref 0.3–1.2)
Total Protein: 5.8 g/dL — ABNORMAL LOW (ref 6.5–8.1)

## 2015-05-21 LAB — BLOOD GAS, ARTERIAL
Acid-base deficit: 20.1 mmol/L — ABNORMAL HIGH (ref 0.0–2.0)
Bicarbonate: 6.9 mEq/L — ABNORMAL LOW (ref 20.0–24.0)
Drawn by: 35849
FIO2: 0.21 %
O2 Saturation: 94.2 %
PCO2 ART: 19.7 mmHg — AB (ref 35.0–45.0)
PO2 ART: 87.7 mmHg (ref 80.0–100.0)
Patient temperature: 98.6
TCO2: 7.5 mmol/L (ref 0–100)
pH, Arterial: 7.172 — CL (ref 7.350–7.450)

## 2015-05-21 LAB — CLOSTRIDIUM DIFFICILE BY PCR: Toxigenic C. Difficile by PCR: NEGATIVE

## 2015-05-21 LAB — BASIC METABOLIC PANEL
ANION GAP: 18 — AB (ref 5–15)
ANION GAP: 17 — AB (ref 5–15)
Anion gap: 22 — ABNORMAL HIGH (ref 5–15)
Anion gap: 19 — ABNORMAL HIGH (ref 5–15)
BUN: 72 mg/dL — ABNORMAL HIGH (ref 6–20)
BUN: 66 mg/dL — ABNORMAL HIGH (ref 6–20)
BUN: 63 mg/dL — ABNORMAL HIGH (ref 6–20)
BUN: 62 mg/dL — ABNORMAL HIGH (ref 6–20)
CALCIUM: 8 mg/dL — AB (ref 8.9–10.3)
CALCIUM: 7.4 mg/dL — AB (ref 8.9–10.3)
CHLORIDE: 99 mmol/L — AB (ref 101–111)
CO2: 12 mmol/L — ABNORMAL LOW (ref 22–32)
CO2: 9 mmol/L — ABNORMAL LOW (ref 22–32)
CO2: 14 mmol/L — AB (ref 22–32)
CO2: 15 mmol/L — AB (ref 22–32)
CREATININE: 7.88 mg/dL — AB (ref 0.44–1.00)
Calcium: 7.6 mg/dL — ABNORMAL LOW (ref 8.9–10.3)
Calcium: 7.4 mg/dL — ABNORMAL LOW (ref 8.9–10.3)
Chloride: 94 mmol/L — ABNORMAL LOW (ref 101–111)
Chloride: 103 mmol/L (ref 101–111)
Chloride: 99 mmol/L — ABNORMAL LOW (ref 101–111)
Creatinine, Ser: 7.22 mg/dL — ABNORMAL HIGH (ref 0.44–1.00)
Creatinine, Ser: 7 mg/dL — ABNORMAL HIGH (ref 0.44–1.00)
Creatinine, Ser: 6.7 mg/dL — ABNORMAL HIGH (ref 0.44–1.00)
GFR calc Af Amer: 5 mL/min — ABNORMAL LOW (ref >60)
GFR calc Af Amer: 5 mL/min — ABNORMAL LOW (ref >60)
GFR calc Af Amer: 6 mL/min — ABNORMAL LOW (ref >60)
GFR calc non Af Amer: 4 mL/min — ABNORMAL LOW (ref >60)
GFR calc non Af Amer: 5 mL/min — ABNORMAL LOW (ref >60)
GFR calc non Af Amer: 5 mL/min — ABNORMAL LOW (ref >60)
GFR, EST AFRICAN AMERICAN: 5 mL/min — AB (ref >60)
GFR, EST NON AFRICAN AMERICAN: 5 mL/min — AB (ref >60)
GLUCOSE: 154 mg/dL — AB (ref 65–99)
GLUCOSE: 168 mg/dL — AB (ref 65–99)
Glucose, Bld: 109 mg/dL — ABNORMAL HIGH (ref 65–99)
Glucose, Bld: 184 mg/dL — ABNORMAL HIGH (ref 65–99)
POTASSIUM: 5.4 mmol/L — AB (ref 3.5–5.1)
POTASSIUM: 5.1 mmol/L (ref 3.5–5.1)
Potassium: 7.1 mmol/L (ref 3.5–5.1)
Potassium: 5.9 mmol/L — ABNORMAL HIGH (ref 3.5–5.1)
SODIUM: 128 mmol/L — AB (ref 135–145)
Sodium: 131 mmol/L — ABNORMAL LOW (ref 135–145)
Sodium: 131 mmol/L — ABNORMAL LOW (ref 135–145)
Sodium: 131 mmol/L — ABNORMAL LOW (ref 135–145)

## 2015-05-21 LAB — URINALYSIS, ROUTINE W REFLEX MICROSCOPIC
Bilirubin Urine: NEGATIVE
GLUCOSE, UA: NEGATIVE mg/dL
Ketones, ur: NEGATIVE mg/dL
Nitrite: NEGATIVE
Protein, ur: 100 mg/dL — AB
Specific Gravity, Urine: 1.01 (ref 1.005–1.030)
UROBILINOGEN UA: 0.2 mg/dL (ref 0.0–1.0)
pH: 5 (ref 5.0–8.0)

## 2015-05-21 LAB — TYPE AND SCREEN
ABO/RH(D): O POS
Antibody Screen: NEGATIVE

## 2015-05-21 LAB — CBC
HCT: 31.7 % — ABNORMAL LOW (ref 36.0–46.0)
HEMATOCRIT: 32.2 % — AB (ref 36.0–46.0)
HEMATOCRIT: 29.9 % — AB (ref 36.0–46.0)
HEMOGLOBIN: 10.8 g/dL — AB (ref 12.0–15.0)
HEMOGLOBIN: 10.3 g/dL — AB (ref 12.0–15.0)
Hemoglobin: 11 g/dL — ABNORMAL LOW (ref 12.0–15.0)
MCH: 32.8 pg (ref 26.0–34.0)
MCH: 32.7 pg (ref 26.0–34.0)
MCH: 32.8 pg (ref 26.0–34.0)
MCHC: 34.2 g/dL (ref 30.0–36.0)
MCHC: 34.1 g/dL (ref 30.0–36.0)
MCHC: 34.4 g/dL (ref 30.0–36.0)
MCV: 96.1 fL (ref 78.0–100.0)
MCV: 96.1 fL (ref 78.0–100.0)
MCV: 95.2 fL (ref 78.0–100.0)
Platelets: 230 10*3/uL (ref 150–400)
Platelets: 258 10*3/uL (ref 150–400)
Platelets: 231 10*3/uL (ref 150–400)
RBC: 3.35 MIL/uL — ABNORMAL LOW (ref 3.87–5.11)
RBC: 3.3 MIL/uL — AB (ref 3.87–5.11)
RBC: 3.14 MIL/uL — ABNORMAL LOW (ref 3.87–5.11)
RDW: 12.9 % (ref 11.5–15.5)
RDW: 13.1 % (ref 11.5–15.5)
RDW: 13.2 % (ref 11.5–15.5)
WBC: 13.5 10*3/uL — AB (ref 4.0–10.5)
WBC: 12.9 10*3/uL — AB (ref 4.0–10.5)
WBC: 11.3 10*3/uL — ABNORMAL HIGH (ref 4.0–10.5)

## 2015-05-21 LAB — I-STAT CHEM 8, ED
BUN: 81 mg/dL — ABNORMAL HIGH (ref 6–20)
CALCIUM ION: 1.02 mmol/L — AB (ref 1.13–1.30)
Chloride: 104 mmol/L (ref 101–111)
Creatinine, Ser: 7.7 mg/dL — ABNORMAL HIGH (ref 0.44–1.00)
GLUCOSE: 133 mg/dL — AB (ref 65–99)
HEMATOCRIT: 33 % — AB (ref 36.0–46.0)
Hemoglobin: 11.2 g/dL — ABNORMAL LOW (ref 12.0–15.0)
Potassium: 6.7 mmol/L (ref 3.5–5.1)
Sodium: 128 mmol/L — ABNORMAL LOW (ref 135–145)
TCO2: 10 mmol/L (ref 0–100)

## 2015-05-21 LAB — LACTIC ACID, PLASMA
LACTIC ACID, VENOUS: 7.7 mmol/L — AB (ref 0.5–2.0)
LACTIC ACID, VENOUS: 7.5 mmol/L — AB (ref 0.5–2.0)
LACTIC ACID, VENOUS: 7.2 mmol/L — AB (ref 0.5–2.0)

## 2015-05-21 LAB — URINE MICROSCOPIC-ADD ON

## 2015-05-21 LAB — PROTIME-INR
INR: 5.2 (ref 0.00–1.49)
INR: 6.09 — AB (ref 0.00–1.49)
INR: 5.3 — AB (ref 0.00–1.49)
INR: 1.92 — AB (ref 0.00–1.49)
PROTHROMBIN TIME: 46 s — AB (ref 11.6–15.2)
PROTHROMBIN TIME: 47 s — AB (ref 11.6–15.2)
Prothrombin Time: 52.1 seconds — ABNORMAL HIGH (ref 11.6–15.2)
Prothrombin Time: 21.9 seconds — ABNORMAL HIGH (ref 11.6–15.2)

## 2015-05-21 LAB — APTT: APTT: 41 s — AB (ref 24–37)

## 2015-05-21 LAB — LACTATE DEHYDROGENASE: LDH: 276 U/L — AB (ref 98–192)

## 2015-05-21 LAB — SODIUM, URINE, RANDOM: Sodium, Ur: 81 mmol/L

## 2015-05-21 LAB — TSH: TSH: 1.285 u[IU]/mL (ref 0.350–4.500)

## 2015-05-21 LAB — CBG MONITORING, ED: Glucose-Capillary: 125 mg/dL — ABNORMAL HIGH (ref 65–99)

## 2015-05-21 LAB — GLUCOSE, CAPILLARY
GLUCOSE-CAPILLARY: 106 mg/dL — AB (ref 65–99)
Glucose-Capillary: 121 mg/dL — ABNORMAL HIGH (ref 65–99)
Glucose-Capillary: 142 mg/dL — ABNORMAL HIGH (ref 65–99)
Glucose-Capillary: 100 mg/dL — ABNORMAL HIGH (ref 65–99)
Glucose-Capillary: 146 mg/dL — ABNORMAL HIGH (ref 65–99)
Glucose-Capillary: 153 mg/dL — ABNORMAL HIGH (ref 65–99)

## 2015-05-21 LAB — TROPONIN I
TROPONIN I: 0.03 ng/mL (ref <0.031)
Troponin I: 0.03 ng/mL (ref <0.031)

## 2015-05-21 LAB — AMMONIA: AMMONIA: 26 umol/L (ref 9–35)

## 2015-05-21 LAB — CORTISOL: Cortisol, Plasma: 25.1 ug/dL

## 2015-05-21 LAB — CREATININE, URINE, RANDOM: Creatinine, Urine: 48.3 mg/dL

## 2015-05-21 LAB — MRSA PCR SCREENING: MRSA by PCR: NEGATIVE

## 2015-05-21 LAB — LIPASE, BLOOD: LIPASE: 49 U/L (ref 22–51)

## 2015-05-21 LAB — PROCALCITONIN: Procalcitonin: 32.08 ng/mL

## 2015-05-21 LAB — POTASSIUM: POTASSIUM: 6.9 mmol/L — AB (ref 3.5–5.1)

## 2015-05-21 MED ORDER — INSULIN ASPART 100 UNIT/ML ~~LOC~~ SOLN
0.0000 [IU] | SUBCUTANEOUS | Status: DC
  Administered 2015-05-21 (×2): 1 [IU] via SUBCUTANEOUS
  Administered 2015-05-21 – 2015-05-22 (×3): 2 [IU] via SUBCUTANEOUS
  Administered 2015-05-23: 1 [IU] via SUBCUTANEOUS
  Administered 2015-05-23 (×2): 2 [IU] via SUBCUTANEOUS
  Administered 2015-05-23 (×3): 1 [IU] via SUBCUTANEOUS
  Administered 2015-05-24 (×2): 2 [IU] via SUBCUTANEOUS

## 2015-05-21 MED ORDER — LEVOTHYROXINE SODIUM 100 MCG IV SOLR
37.5000 ug | Freq: Every day | INTRAVENOUS | Status: DC
  Administered 2015-05-21 – 2015-05-23 (×3): 37.5 ug via INTRAVENOUS
  Filled 2015-05-21 (×4): qty 5

## 2015-05-21 MED ORDER — VANCOMYCIN HCL IN DEXTROSE 750-5 MG/150ML-% IV SOLN
750.0000 mg | INTRAVENOUS | Status: DC
  Filled 2015-05-21: qty 150

## 2015-05-21 MED ORDER — SODIUM BICARBONATE 8.4 % IV SOLN
50.0000 meq | Freq: Once | INTRAVENOUS | Status: AC
Start: 2015-05-21 — End: 2015-05-21
  Administered 2015-05-21: 50 meq via INTRAVENOUS
  Filled 2015-05-21 (×2): qty 50

## 2015-05-21 MED ORDER — VANCOMYCIN 50 MG/ML ORAL SOLUTION
125.0000 mg | Freq: Four times a day (QID) | ORAL | Status: DC
  Administered 2015-05-21 – 2015-05-22 (×4): 125 mg via ORAL
  Filled 2015-05-21 (×9): qty 2.5

## 2015-05-21 MED ORDER — SODIUM CHLORIDE 0.9 % IV SOLN
INTRAVENOUS | Status: AC
  Administered 2015-05-21 – 2015-05-22 (×2): via INTRAVENOUS

## 2015-05-21 MED ORDER — ACETAMINOPHEN 650 MG RE SUPP: 650.0000 mg | Freq: Four times a day (QID) | RECTAL | Status: DC | PRN

## 2015-05-21 MED ORDER — DOXYCYCLINE HYCLATE 100 MG IV SOLR
100.0000 mg | Freq: Two times a day (BID) | INTRAVENOUS | Status: DC
Start: 2015-05-21 — End: 2015-05-21
  Administered 2015-05-21: 100 mg via INTRAVENOUS
  Filled 2015-05-21: qty 100

## 2015-05-21 MED ORDER — ONDANSETRON HCL 4 MG PO TABS: 4.0000 mg | ORAL_TABLET | Freq: Four times a day (QID) | ORAL | Status: DC | PRN

## 2015-05-21 MED ORDER — ALBUTEROL SULFATE (2.5 MG/3ML) 0.083% IN NEBU
10.0000 mg | INHALATION_SOLUTION | Freq: Once | RESPIRATORY_TRACT | Status: AC
  Administered 2015-05-21: 10 mg via RESPIRATORY_TRACT
  Filled 2015-05-21: qty 12

## 2015-05-21 MED ORDER — SODIUM CHLORIDE 0.9 % IV SOLN: Freq: Once | INTRAVENOUS | Status: DC

## 2015-05-21 MED ORDER — SODIUM BICARBONATE 8.4 % IV SOLN
INTRAVENOUS | Status: DC
  Administered 2015-05-21: 13:00:00 via INTRAVENOUS
  Filled 2015-05-21 (×3): qty 150

## 2015-05-21 MED ORDER — METRONIDAZOLE IN NACL 5-0.79 MG/ML-% IV SOLN
500.0000 mg | Freq: Three times a day (TID) | INTRAVENOUS | Status: DC
  Administered 2015-05-21 – 2015-05-22 (×3): 500 mg via INTRAVENOUS
  Filled 2015-05-21 (×5): qty 100

## 2015-05-21 MED ORDER — PANTOPRAZOLE SODIUM 40 MG IV SOLR
40.0000 mg | Freq: Two times a day (BID) | INTRAVENOUS | Status: DC
  Administered 2015-05-21 – 2015-05-23 (×6): 40 mg via INTRAVENOUS
  Filled 2015-05-21 (×7): qty 40

## 2015-05-21 MED ORDER — SODIUM BICARBONATE 8.4 % IV SOLN
INTRAVENOUS | Status: DC
  Administered 2015-05-21: 12:00:00 via INTRAVENOUS
  Filled 2015-05-21: qty 100

## 2015-05-21 MED ORDER — METOPROLOL TARTRATE 1 MG/ML IV SOLN
5.0000 mg | Freq: Three times a day (TID) | INTRAVENOUS | Status: DC | PRN
  Administered 2015-05-21 – 2015-05-22 (×2): 5 mg via INTRAVENOUS
  Filled 2015-05-21 (×2): qty 5

## 2015-05-21 MED ORDER — VITAMIN K1 10 MG/ML IJ SOLN
10.0000 mg | Freq: Once | INTRAMUSCULAR | Status: AC
  Administered 2015-05-21: 10 mg via SUBCUTANEOUS
  Filled 2015-05-21: qty 1

## 2015-05-21 MED ORDER — CETYLPYRIDINIUM CHLORIDE 0.05 % MT LIQD
7.0000 mL | Freq: Two times a day (BID) | OROMUCOSAL | Status: DC
  Administered 2015-05-21: 7 mL via OROMUCOSAL

## 2015-05-21 MED ORDER — VANCOMYCIN HCL IN DEXTROSE 750-5 MG/150ML-% IV SOLN
750.0000 mg | INTRAVENOUS | Status: DC
  Administered 2015-05-21: 750 mg via INTRAVENOUS
  Filled 2015-05-21: qty 150

## 2015-05-21 MED ORDER — DEXTROSE 50 % IV SOLN
1.0000 | Freq: Once | INTRAVENOUS | Status: AC
  Administered 2015-05-21: 50 mL via INTRAVENOUS
  Filled 2015-05-21: qty 50

## 2015-05-21 MED ORDER — INSULIN ASPART 100 UNIT/ML IV SOLN
10.0000 [IU] | Freq: Once | INTRAVENOUS | Status: AC
  Administered 2015-05-21: 10 [IU] via INTRAVENOUS
  Filled 2015-05-21: qty 1

## 2015-05-21 MED ORDER — HEPARIN SODIUM (PORCINE) 1000 UNIT/ML DIALYSIS
1000.0000 [IU] | INTRAMUSCULAR | Status: DC | PRN
  Administered 2015-05-21: 2400 [IU] via INTRAVENOUS_CENTRAL
  Filled 2015-05-21: qty 6
  Filled 2015-05-21: qty 3

## 2015-05-21 MED ORDER — SODIUM CHLORIDE 0.9 % IV BOLUS (SEPSIS)
1000.0000 mL | Freq: Once | INTRAVENOUS | Status: AC
  Administered 2015-05-21: 1000 mL via INTRAVENOUS

## 2015-05-21 MED ORDER — PIPERACILLIN-TAZOBACTAM IN DEX 2-0.25 GM/50ML IV SOLN
2.2500 g | Freq: Three times a day (TID) | INTRAVENOUS | Status: DC
  Administered 2015-05-21 – 2015-05-23 (×7): 2.25 g via INTRAVENOUS
  Filled 2015-05-21 (×11): qty 50

## 2015-05-21 MED ORDER — ACETAMINOPHEN 325 MG PO TABS: 650.0000 mg | ORAL_TABLET | Freq: Four times a day (QID) | ORAL | Status: DC | PRN

## 2015-05-21 MED ORDER — ONDANSETRON HCL 4 MG/2ML IJ SOLN: 4.0000 mg | Freq: Four times a day (QID) | INTRAMUSCULAR | Status: DC | PRN

## 2015-05-21 MED ORDER — LIDOCAINE HCL (PF) 1 % IJ SOLN
INTRAMUSCULAR | Status: AC
  Filled 2015-05-21: qty 5

## 2015-05-21 MED ORDER — SODIUM CHLORIDE 0.9 % IV SOLN: Freq: Once | INTRAVENOUS | Status: AC

## 2015-05-21 MED ORDER — IOHEXOL 300 MG/ML  SOLN
25.0000 mL | INTRAMUSCULAR | Status: AC
  Administered 2015-05-21 (×2): 25 mL via ORAL

## 2015-05-21 MED ORDER — SODIUM POLYSTYRENE SULFONATE 15 GM/60ML PO SUSP
15.0000 g | Freq: Once | ORAL | Status: AC
  Administered 2015-05-21: 15 g via ORAL
  Filled 2015-05-21: qty 60

## 2015-05-21 MED ORDER — SODIUM CHLORIDE 0.9 % IV BOLUS (SEPSIS): 1000.0000 mL | Freq: Once | INTRAVENOUS | Status: DC

## 2015-05-21 MED ORDER — PANTOPRAZOLE SODIUM 40 MG IV SOLR
80.0000 mg | Freq: Once | INTRAVENOUS | Status: AC
  Administered 2015-05-21: 80 mg via INTRAVENOUS
  Filled 2015-05-21: qty 80

## 2015-05-21 NOTE — Progress Notes (Signed)
Report called to nurse, Anderson Malta. Patient is alert. Bp 158/63, HR 75, RR 21 on room air. Family made aware that patient is transferring. [place] notified.

## 2015-05-21 NOTE — Progress Notes (Signed)
ANTIBIOTIC CONSULT NOTE - INITIAL  Pharmacy Consult for Vancomycin/Zosyn  Indication: rule out sepsis  No Known Allergies  Patient Measurements: Height: 5\' 4"  (162.6 cm) Weight: 136 lb 7.4 oz (61.9 kg) IBW/kg (Calculated) : 54.7  Vital Signs: Temp: 97.9 F (36.6 C) (06/14 0259) Temp Source: Oral (06/14 0259) BP: 127/52 mmHg (06/14 0400) Pulse Rate: 73 (06/14 0400) Intake/Output from previous day: 06/13 0701 - 06/14 0700 In: 3225 [I.V.:3225] Out: -  Intake/Output from this shift: Total I/O In: 3225 [I.V.:3225] Out: -   Labs:  Recent Labs  05/20/15 2300 05/21/15 0031 05/21/15 0420  WBC 12.7*  --  13.5*  HGB 10.8* 11.2* 11.0*  PLT 265  --  230  CREATININE 7.88* 7.70* 7.41*   Estimated Creatinine Clearance: 4.6 mL/min (by C-G formula based on Cr of 7.41). No results for input(s): VANCOTROUGH, VANCOPEAK, VANCORANDOM, GENTTROUGH, GENTPEAK, GENTRANDOM, TOBRATROUGH, TOBRAPEAK, TOBRARND, AMIKACINPEAK, AMIKACINTROU, AMIKACIN in the last 72 hours.   Microbiology: Recent Results (from the past 720 hour(s))  MRSA PCR Screening     Status: None   Collection Time: 05/21/15  3:00 AM  Result Value Ref Range Status   MRSA by PCR NEGATIVE NEGATIVE Final    Comment:        The GeneXpert MRSA Assay (FDA approved for NASAL specimens only), is one component of a comprehensive MRSA colonization surveillance program. It is not intended to diagnose MRSA infection nor to guide or monitor treatment for MRSA infections.     Medical History: Past Medical History  Diagnosis Date  . OSA (obstructive sleep apnea)     AHI-44/hr, AHI during REM 61.8/hr, avg O2 during REM and NREM 96%  . Atrial fibrillation   . Myocardial infarct   . Diabetes mellitus   . Hearing loss   . Hypertension   . Hyperlipidemia   . Senile degeneration of brain   . Other generalized ischemic cerebrovascular disease   . Personal history of fall   . Pain in joint, shoulder region   . Personality change  due to conditions classified elsewhere   . Loss of weight   . Chronic kidney disease, stage II (mild)   . Obstructive sleep apnea (adult) (pediatric)   . Senile degeneration of brain   . Dizziness and giddiness   . Personal history of noncompliance with medical treatment, presenting hazards to health   . Dyskinesia of esophagus   . Shortness of breath   . Chest pain, unspecified 06/19/2009  . Long term (current) use of anticoagulants   . Unspecified hereditary and idiopathic peripheral neuropathy   . Pain in joint, site unspecified   . Carpal tunnel syndrome   . Other hammer toe (acquired)   . Alopecia areata   . Dysphagia, unspecified(787.20)   . Occlusion and stenosis of carotid artery without mention of cerebral infarction   . Other symptoms involving cardiovascular system   . Other acne   . Allergic rhinitis, cause unspecified   . Type II or unspecified type diabetes mellitus without mention of complication, uncontrolled   . Varicose veins of lower extremities   . Cervicalgia   . Disturbance of salivary secretion   . Insomnia, unspecified   . Unspecified hypothyroidism   . Palpitations 715.90  . Other malaise and fatigue   . Diverticulosis of colon (without mention of hemorrhage)   . Benign neoplasm of colon   . Depressive disorder, not elsewhere classified   . Syncope and collapse   . Paroxysmal atrial fibrillation   .  Hypertension   . Dizziness     Assessment: 81 y/o F from home with syncopal episode, WBC mildly elevated, pt noted to be in acute renal failure with markedly elevated Scr (baseline appears to be 1-1.2), elevated lactic acid, other labs as above.   Goal of Therapy:  Vancomycin trough level 15-20 mcg/ml  Plan:  -Vancomycin 750 mg IV q48h -Zosyn 2.25g IV q8h -Trend WBC, temp, renal function  -Drug levels as indicated   Narda Bonds 05/21/2015,5:39 AM

## 2015-05-21 NOTE — Progress Notes (Signed)
CRITICAL VALUE ALERT  Critical value received:  Critical blood gas  Date of notification:  06/14/206   Time of notification:  0810  Critical value read back:yes  Nurse who received alert:  PH= 7.17, PCO2= 19.7, PO2= 87.7, Sat 94%, base excess -20.1, HCO3=6.9   MD notified (1st page):  Dr. Phillip Heal  Time of first page:  [phone]  MD notified (2nd page):  Time of second page:  Responding MD:  Dr. Phillip Heal  Time MD responded:  [phone]

## 2015-05-21 NOTE — Progress Notes (Signed)
CRITICAL VALUE ALERT  Critical value received:  Lactic acid  Date of notification: 05/21/2015  Time of notification:  0914  Critical value read back:yes  Nurse who received alert:  Marlow Baars  MD notified (1st page):  Dr. Bonner Puna  Time of first page:  [phone]  MD notified (2nd page):  Time of second page:  Responding MD:  Dr. Bonner Puna  Time MD responded:  1010 in person

## 2015-05-21 NOTE — Progress Notes (Signed)
Transport at bedside to take patient for CT scan. Elink and central tele notified that patient is going down off monitor.

## 2015-05-21 NOTE — H&P (Addendum)
[facility] History and Physical  [name] NLZ:[id number] DOB: Aug 11, 1925 DOA: 05/20/2015  Referring physician: Dr.Palumbo. PCP: Estill Dooms, MD  Specialists: Dr.Berry. Cardiologist.  Chief Complaint: Nausea vomiting and diarrhea.  History obtained from patient's son.  HPI: [name] is a 81 y.o. female with a history of paroxysmal atrial fibrillation, hypertension, diabetes mellitus, OSA was brought to the ER after patient's son found the patient was increasingly weak confused and having persistent nausea vomiting and diarrhea. As per patient's son patient has been having some productive cough over the last few weeks and had taken to her PCP and was started on Z-Pak 5 days ago. Following the first dose the next day patient started developing diarrhea with rectal bleeding. Patient eventually started developing nausea and vomiting. It was also noticed that patient has been taking some of her medication more than required after patient's son did pill counts. In the ER patient was found to be coagulopathic and in the ER patient was hypotensive and bradycardic with labs showing markedly elevated creatinine from her baseline with hyperkalemia. Patient also may have taken her metoprolol more than required. ER physician had given glucagon followed by IV bicarbonate and insulin D5W for the hyperkalemia. Patient was given fluid bolus and admitted for further management. Patient's lactic acid was elevated and on call pulmonary critical care was consulted and patient has been admitted for further management of acute renal failure, rectal bleeding, coagulopathy and nausea vomiting and diarrhea. Patient also was given vitamin K 5 mg for coagulopathy.  Review of Systems: As presented in the history of presenting illness, rest negative.  Past Medical History  Diagnosis Date  . OSA (obstructive sleep apnea)     AHI-44/hr, AHI during REM 61.8/hr, avg O2 during REM and NREM 96%  . Atrial  fibrillation   . Myocardial infarct   . Diabetes mellitus   . Hearing loss   . Hypertension   . Hyperlipidemia   . Senile degeneration of brain   . Other generalized ischemic cerebrovascular disease   . Personal history of fall   . Pain in joint, shoulder region   . 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 Loss of weight   . Chronic kidney disease, stage II (mild)   . Obstructive sleep apnea (adult) (pediatric)   . Senile degeneration of brain   . Dizziness and giddiness   . Personal history of noncompliance with medical treatment, presenting hazards to health   . Dyskinesia of esophagus   . Shortness of breath   . Chest pain, unspecified 06/19/2009  . Long term (current) use of anticoagulants   . Unspecified hereditary and idiopathic peripheral neuropathy   . Pain in joint, site unspecified   . Carpal tunnel syndrome   . Other hammer toe (acquired)   . Alopecia areata   . Dysphagia, unspecified(787.20)   . Occlusion and stenosis of carotid artery without mention of cerebral infarction   . Other symptoms involving cardiovascular system   . Other acne   . Allergic rhinitis, cause unspecified   . Type II or unspecified type diabetes mellitus without mention of complication, uncontrolled   . Varicose veins of lower extremities   . Cervicalgia   . Disturbance of salivary secretion   . Insomnia, unspecified   . Unspecified hypothyroidism   . Palpitations 715.90  . Other malaise and fatigue   . Diverticulosis of colon (without mention of hemorrhage)   . Benign neoplasm of colon   .  Depressive disorder, not elsewhere classified   . Syncope and collapse   . Paroxysmal atrial fibrillation   . Hypertension   . Dizziness    Past Surgical History  Procedure Laterality Date  . Total abdominal hysterectomy    . Tonsillectomy    . Appendectomy    . Hand surgery    . Cataract od  10/04/2012  . Cataract os  01/31/2013  . Carotid doppler  07/25/2012    Bilat  internal carotid arteries demonstrate 1-395 stenoses.  . Transthoracic echocardiogram  04/24/2011    EF >55%, moderate tricuspid regurg.   Social History:  reports that she has never smoked. She does not have any smokeless tobacco history on file. She reports that she does not drink alcohol or use illicit drugs. Where does patient live home.  Can patient participate in ADLs? Not sure.  No Known Allergies  Family History:  Family History  Problem Relation Age of Onset  . Cervical cancer Mother   . Cancer Mother   . Parkinsonism Father   . Sleep apnea Brother   . Diabetes Brother   . Sleep apnea Brother   . Peripheral vascular disease Brother   . Cancer Daughter       Prior to Admission medications   Medication Sig Start Date End Date Taking? Authorizing Provider  estradiol (ESTRACE) 0.5 MG tablet TAKE 1 TABLET EVERY DAY FOR HORMONES 11/20/14  Yes Estill Dooms, MD  hydroxypropyl methylcellulose (ISOPTO TEARS) 2.5 % ophthalmic solution Place 1 drop into both eyes as needed for dry eyes.   Yes Historical Provider, MD  levothyroxine (SYNTHROID, LEVOTHROID) 50 MCG tablet TAKE 1 TABLET ON [facility] THROUGH FRIDAY FOR THYROID SUPPLEMENT 11/26/14  Yes Lauree Chandler, NP  levothyroxine (SYNTHROID, LEVOTHROID) 75 MCG tablet TAKE 1 TABLET BY MOUTH ON MONDAY,TUESDAY,THURSDAY,SATURDAY AND SUNDAY 02/13/15  Yes Estill Dooms, MD  lisinopril (PRINIVIL,ZESTRIL) 5 MG tablet TAKE 1 TABLET BY MOUTH DAILY. 11/21/14  Yes Lorretta Harp, MD  metFORMIN (GLUCOPHAGE) 500 MG tablet TAKE 2 TABLETS BY MOUTH EVERY MORNING AND 1 TAB AT SUPPER TO CONTROL BLOOD SUGAR 05/20/15  Yes Estill Dooms, MD  metoprolol (LOPRESSOR) 50 MG tablet TAKE 1 TABLET BY MOUTH TWICE A DAY FOR BLOOD PRESSURE 04/22/15  Yes Estill Dooms, MD  spironolactone (ALDACTONE) 25 MG tablet TAKE 1 TABLET BY MOUTH TWICE A DAY TO STRENGTHEN THE HEART AND REGULATE BLOOD PRESSURE 12/25/14  Yes Estill Dooms, MD  warfarin (COUMADIN) 5 MG tablet  Take 1 to 1.5 tablets by mouth daily as directed by coumadin clinic Patient taking differently: Take 5-7.5 mg by mouth daily. Take 5mg  on Sunday, Tuesday, Thursday and Saturday. Take 5mg  on all other days of the week. 05/03/15  Yes Lorretta Harp, MD    Physical Exam: Filed Vitals:   05/21/15 0200 05/21/15 0215 05/21/15 0230 05/21/15 0259  BP: 141/58 137/58 128/48 154/61  Pulse: 89 87 86 84  Temp:    97.9 F (36.6 C)  TempSrc:    Oral  Resp: 20 24 17 23   Height:    5\' 4"  (1.626 m)  Weight:    61.9 kg (136 lb 7.4 oz)  SpO2: 98% 97% 98% 99%     General:  Moderately built and nourished.  Eyes: Anicteric. No pallor.  ENT: No discharge from the ears eyes nose and mouth.  Neck: No mass felt.  Cardiovascular: S1-S2 heard.  Respiratory: No rhonchi or crepitations.  Abdomen: Soft nontender bowel sounds present. No  guarding or rigidity.  Skin: No rash.  Musculoskeletal: No edema.  Psychiatric: Appears normal.  Neurologic: Alert awake oriented to her name only. Moves all extremities.  Labs on Admission:  Basic Metabolic Panel:  Recent Labs Lab 05/20/15 2300 05/21/15 0017 05/21/15 0031  NA 128*  --  128*  K 7.1* 6.9* 6.7*  CL 94*  --  104  CO2 12*  --   --   GLUCOSE 154*  --  133*  BUN 72*  --  81*  CREATININE 7.88*  --  7.70*  CALCIUM 8.0*  --   --    Liver Function Tests: No results for input(s): AST, ALT, ALKPHOS, BILITOT, PROT, ALBUMIN in the last 168 hours. No results for input(s): LIPASE, AMYLASE in the last 168 hours. No results for input(s): AMMONIA in the last 168 hours. CBC:  Recent Labs Lab 05/20/15 2300 05/21/15 0031  WBC 12.7*  --   NEUTROABS 8.9*  --   HGB 10.8* 11.2*  HCT 30.9* 33.0*  MCV 95.1  --   PLT 265  --    Cardiac Enzymes:  Recent Labs Lab 05/20/15 2300  TROPONINI 0.03    BNP (last 3 results) No results for input(s): BNP in the last 8760 hours.  ProBNP (last 3 results) No results for input(s): PROBNP in the last 8760  hours.  CBG:  Recent Labs Lab 05/21/15 0104  GLUCAP 125*    Radiological Exams on Admission: Dg Chest Portable 1 View  05/20/2015   CLINICAL DATA:  Chest pain, hypotension, syncope  EXAM: PORTABLE CHEST - 1 VIEW  COMPARISON:  04/04/2014  FINDINGS: The heart size and mediastinal contours are within normal limits. Both lungs are clear. The visualized skeletal structures are unremarkable.  IMPRESSION: No active disease.   Electronically Signed   By: Lucienne Capers M.D.   On: 05/20/2015 23:35   Dg Abd Portable 1v  05/21/2015   CLINICAL DATA:  Chest and abdominal pain. Shortness of breath. Hypotension.  EXAM: PORTABLE ABDOMEN - 1 VIEW  COMPARISON:  01/07/2011  FINDINGS: The right abdomen and low pelvis are not included within the field of view. Superimposed structures obscure visualization of some of the left upper quadrant. Visualized bowel gas pattern is normal without significant colonic or small bowel distention. No radiopaque stones. Degenerative changes in the spine.  IMPRESSION: Limited study.  No evidence of bowel obstruction.   Electronically Signed   By: Lucienne Capers M.D.   On: 05/21/2015 00:00    EKG: Independently reviewed. Initial EKG showed A. fib with bradycardia which improved the second EKG with  Assessment/Plan Principal Problem:   Acute renal failure Active Problems:   OSA (obstructive sleep apnea)   Atrial fibrillation   Hypothyroidism   Diarrhea   Hyperkalemia   Diabetes mellitus type 2, controlled   1. Acute renal failure with hyperkalemia and metabolic acidosis - most likely could be prerenal given that patient has persistent nausea vomiting diarrhea recently with GI bleed and in addition patient also has been on lisinopril and spironolactone. Urine analysis is pending and patient is yet to make urine. Check urine creatinine and sodium. Continue with hydration and hold off lisinopril and spironolactone. I have ordered CT abdomen and pelvis during which we can  check for any obstruction. Follow lactic acid levels. Patient does not have any fever or thrombocytopenia to suggest any hemolytic process at this time. ABG is pending. 2. Bradycardia with hypotension - probably related to medication and also hyperkalemia. Patient may have  taken extra doses of metoprolol and ER physician had given glucagon and for the hyperkalemia patient was given D50 insulin and bicarbonate and albuterol. Continue with hydration and hold antihypertensives. Hypotension improved with fluids. 3. Rectal bleeding with coagulopathy - rectal bleeding could be either from antibiotics associated colitis or ischemic colitis. I have ordered CT abdomen and pelvis with oral contrast. Patient for now will be kept nothing by mouth. Closely follow CBC. If patient's INR does not improve I will order FFP. Protonix for now. 4. Diarrhea - check stool for C. difficile given the patient was recently on antibiotics. 5. History of paroxysmal atrial fibrillation presented with bradycardia and coagulopathy - so holding off rate limiting medications and Coumadin. 6. Diabetes mellitus type 2 - since patient is nothing by mouth patient has been placed on sliding scale coverage. 7. Hypothyroidism - IV Synthroid for now. 8. OSA noncompliant with Cpap. 9. History of memory problems.  For now I have placed patient on empiric antibiotics and ordered procalcitonin levels blood cultures and if negative may discontinue antibiotics. I have discussed with on-call pulmonary critical care Dr. Jimmy Footman who at this time advised to continue with aggressive hydration. CT abdomen and pelvis and head is pending. The patient's LFTs lipase are pending.   DVT Prophylaxis SCDs.  Code Status: Full code.  Family Communication: Patient's son.  Disposition Plan: Admit to inpatient.    KAKRAKANDY,ARSHAD N. [facility] Pager [phone].  If 7PM-7AM, please contact night-coverage [email or web address] Password [facility] 05/21/2015,  3:36 AM

## 2015-05-21 NOTE — Progress Notes (Signed)
CRITICAL VALUE ALERT  Critical value received:  Lactic acid 7.7  Date of notification:  05/21/2015  Time of notification:  0515  Critical value read back: Yes  Nurse who received alert:  Remo Lipps, RN  MD notified (1st page):  Schorr, NP  Time of first page:  0518  Responding MD:  Hilbert Bible, NP  Time MD responded:  [phone]

## 2015-05-21 NOTE — ED Notes (Signed)
Admitting MD at BS.  

## 2015-05-21 NOTE — Progress Notes (Signed)
RITICAL VALUE ALERT  Critical value received:  Protime 47.0; INR 5.30  Date of notification:  05/21/2015  Time of notification:  0944  Critical value read back: yes  Nurse who received alert:  Marlow Baars  MD notified (1st page):  Dr. Bonner Puna  Time of first page:  1007  MD notified (2nd page):  Time of second page:  Responding MD:  Dr. Bonner Puna  Time MD responded:  1010 in person

## 2015-05-21 NOTE — Progress Notes (Signed)
CRITICAL VALUE ALERT  Critical value received:  INR 6.09  Date of notification:  06/14//16  Time of notification:  0524  Critical value read back: Yes  Nurse who received alert:  Remo Lipps, RN  MD notified (1st page):  Hal Hope   Time of first page:  Spoke to [place] on phone at [phone]

## 2015-05-21 NOTE — Progress Notes (Signed)
  7:43 AM I agree with HPI/GPe and A/P per Dr. Hal Hope and with the note from Dr. Vance Gather from earlier this am  tells me she started having some N/V and diarr recently Was bloody per report ++abd pain which resolved but since coming back from CT head, had x 2 further epiosdes non bloody vomit No further dark or tarry stool No noted fever No chills   She has probably MODS/Severe sepsis + Profound Lactic acidosis-type "a" + type "b" metformin.    Her coagulopathy is mild-no rising bili-wonder if she was ingesting excessive tylenol in an attempt to control Abd pain  Empiric Rx CDIFF for now-ddx isch colitis vs diverticular bleed which she has had b4  Agree with Dr Hadley Pen assessment--we will need to sort out her metabolic derangements and CCM will help [place] dispo how things go--We appreciate both Dr. Titus Mould and Dr. Adin Hector input in advance  81 y/o ? P afib followed by Dr. Gwenlyn Found [prior Amiodarone, CHad2Vasc2 score~6, Hasbled score ? , Cardiac Cath 04/21/2007-No isch, severe OSA by sleep study 04/2011 [ non tolerant CPAP-last seen Dr. Gwenette Greet 2013], prior falls with Hip hematoma 12/13/11, bilateral non severe Cartoid stenosis foll by Dr. Trula Slade L>R based on 07/29/11 Carotid Duplex, NIDDM, HTN, prior syncope 04/2007 Has had prior diverticular bleed in the past and lasty colonoscopy was by ?Dr. Lajoyce Corners in 2001      HEENT alert pleasant oriented to place/person-confabulating a little--Can tell me major life events from pior CHEST clear no added sound-no rales no rhonchi CARDIAC-s1 s2 no m/r/g-JVD slighly up  ABDOMEN-soft, slight distended NEURO-able to verbalize SKIN/MUSCULAR  Patient Active Problem List   Diagnosis Date Noted  . Renal failure 05/21/2015  . Acute renal failure 05/21/2015  . Diarrhea 05/21/2015  . Hyperkalemia 05/21/2015  . Diabetes mellitus type 2, controlled 05/21/2015  . Memory deficit 04/03/2015  . Thoracic back pain 04/03/2015  . Occlusion and stenosis of carotid  artery without mention of cerebral infarction 07/23/2014  . Abnormal CXR 04/18/2014  . Hammer toe of right foot 03/14/2014  . Dizzy 03/14/2014  . Abnormality of gait 08/22/2013  . Hypothyroidism 04/23/2013  . Ill-defined cerebrovascular disease 04/18/2013  . Long term current use of anticoagulant therapy 02/23/2013  . Atrial fibrillation 12/13/2011  . HTN (hypertension) 12/13/2011  . DM (diabetes mellitus) 12/13/2011  . OSA (obstructive sleep apnea) 06/22/2011

## 2015-05-21 NOTE — Progress Notes (Signed)
CRITICAL VALUE ALERT  Critical value received: lactic acid 7.2  Date of notification:  06/14/02016  Time of notification:  2023  Critical value read back: yes  Nurse who received alert:  Marlow Baars   MD notified (1st page):  Dr. Verlon Au in person  Time of first page:  In person  MD notified (2nd page):  Time of second page:  Responding MD:  Dr. Verlon Au   Time MD responded:  1051 in person

## 2015-05-21 NOTE — Consult Note (Signed)
PULMONARY / CRITICAL CARE MEDICINE   Name: [name] MRN: [id number] DOB: 12-22-1924    ADMISSION DATE:  05/20/2015 CONSULTATION DATE:  05/21/2015  REFERRING MD :  Dr. Verlon Au  CHIEF COMPLAINT:  Abd pain  INITIAL PRESENTATION: 81 year old female who was admitted 6/13 with acute kidney injury with hyperkalemia and lactic acidosis. Also coagulopathic from coumadin. Limited improvement with ABX and volume. To ICU for PCCM management 6/14.  STUDIES:  CT abdomen 6/13 > Bilateral pleural effusions, moderate on the right and small on the left. Ascites. Distended gallbladder with multiple gallstones. Tiny bilateral renal stones. Slight prominence of the mucosa in the rectum and distal sigmoid.This could represent proctitis CT head 6/13 > No acute intracranial abnormality. Stable degree of cortical volume loss.  SIGNIFICANT EVENTS: 6/13 > admitted for AKI with AMS and diarrhea 6/14 > Profound acidosis, transferred to ICU. PCCM assumed care.   HISTORY OF PRESENT ILLNESS:  81 y/o female P afib followed by Dr. Gwenlyn Found [prior Amiodarone, CHad2Vasc2 score~6, Hasbled score ? , Cardiac Cath 04/21/2007-No isch, severe OSA by sleep study 04/2011 [ non tolerant CPAP-last seen Dr. Gwenette Greet 2013], prior falls with Hip hematoma 12/13/11, bilateral non severe Cartoid stenosis foll by Dr. Trula Slade L>R based on 07/29/11 Carotid Duplex, NIDDM, HTN, prior syncope 04/2007. Has had prior diverticular bleed in the past and lasty colonoscopy was by ? Dr. Lajoyce Corners in 2001. Mrs. Marland recently had some productive cough and was started on Zpak for this 5 days PTA, after one day on the antibiotic she developed diarrhea with rectal bleeding which progressed to nausea and vomiting. Patient's son also noted that she may have been taking more of her medication than she was supposed to after doing his pill counts. She presented to ED 6/13 with these complaints as well as some confusion. She was found to be coagulopathic as she had been taking  extra coumadin. Her renal function was also markedly elevated from baseline with hyperkalemia. She had bradycardia which was thought secondary to that. Lactic acid found to be elevated. She was admitted for AKI, coagulopathy, and diarrhea. Of note she is also on metformin. Renal function and lactic with only minimal improvements since that time, PCCM consulted for further evaluation.    PAST MEDICAL HISTORY :   has a past medical history of OSA (obstructive sleep apnea); Atrial fibrillation; Myocardial infarct; Diabetes mellitus; Hearing loss; Hypertension; Hyperlipidemia; Senile degeneration of brain; Other generalized ischemic cerebrovascular disease; Personal history of fall; Pain in joint, shoulder region; Personality change due to conditions classified elsewhere; Loss of weight; Chronic kidney disease, stage II (mild); Obstructive sleep apnea (adult) (pediatric); Senile degeneration of brain; Dizziness and giddiness; Personal history of noncompliance with medical treatment, presenting hazards to health; Dyskinesia of esophagus; Shortness of breath; Chest pain, unspecified (06/19/2009); Long term (current) use of anticoagulants; Unspecified hereditary and idiopathic peripheral neuropathy; Pain in joint, site unspecified; Carpal tunnel syndrome; Other hammer toe (acquired); Alopecia areata; Dysphagia, unspecified(787.20); Occlusion and stenosis of carotid artery without mention of cerebral infarction; Other symptoms involving cardiovascular system; Other acne; Allergic rhinitis, cause unspecified; Type II or unspecified type diabetes mellitus without mention of complication, uncontrolled; Varicose veins of lower extremities; Cervicalgia; Disturbance of salivary secretion; Insomnia, unspecified; Unspecified hypothyroidism; Palpitations (715.90); Other malaise and fatigue; Diverticulosis of colon (without mention of hemorrhage); Benign neoplasm of colon; Depressive disorder, not elsewhere classified; Syncope  and collapse; Paroxysmal atrial fibrillation; Hypertension; and Dizziness.  has past surgical history that includes Total abdominal hysterectomy; Tonsillectomy; Appendectomy; Hand  surgery; Cataract OD (10/04/2012); Cataract OS (01/31/2013); CAROTID DOPPLER (07/25/2012); and transthoracic echocardiogram (04/24/2011). Prior to Admission medications   Medication Sig Start Date End Date Taking? Authorizing Provider  estradiol (ESTRACE) 0.5 MG tablet TAKE 1 TABLET EVERY DAY FOR HORMONES 11/20/14  Yes Estill Dooms, MD  hydroxypropyl methylcellulose (ISOPTO TEARS) 2.5 % ophthalmic solution Place 1 drop into both eyes as needed for dry eyes.   Yes Historical Provider, MD  levothyroxine (SYNTHROID, LEVOTHROID) 50 MCG tablet TAKE 1 TABLET ON [facility] THROUGH FRIDAY FOR THYROID SUPPLEMENT 11/26/14  Yes Lauree Chandler, NP  levothyroxine (SYNTHROID, LEVOTHROID) 75 MCG tablet TAKE 1 TABLET BY MOUTH ON MONDAY,TUESDAY,THURSDAY,SATURDAY AND SUNDAY 02/13/15  Yes Estill Dooms, MD  lisinopril (PRINIVIL,ZESTRIL) 5 MG tablet TAKE 1 TABLET BY MOUTH DAILY. 11/21/14  Yes Lorretta Harp, MD  metFORMIN (GLUCOPHAGE) 500 MG tablet TAKE 2 TABLETS BY MOUTH EVERY MORNING AND 1 TAB AT SUPPER TO CONTROL BLOOD SUGAR 05/20/15  Yes Estill Dooms, MD  metoprolol (LOPRESSOR) 50 MG tablet TAKE 1 TABLET BY MOUTH TWICE A DAY FOR BLOOD PRESSURE 04/22/15  Yes Estill Dooms, MD  spironolactone (ALDACTONE) 25 MG tablet TAKE 1 TABLET BY MOUTH TWICE A DAY TO STRENGTHEN THE HEART AND REGULATE BLOOD PRESSURE 12/25/14  Yes Estill Dooms, MD  warfarin (COUMADIN) 5 MG tablet Take 1 to 1.5 tablets by mouth daily as directed by coumadin clinic Patient taking differently: Take 5-7.5 mg by mouth daily. Take 5mg  on Sunday, Tuesday, Thursday and Saturday. Take 5mg  on all other days of the week. 05/03/15  Yes Lorretta Harp, MD   No Known Allergies  FAMILY HISTORY:  indicated that her mother is deceased. She indicated that her father is deceased. She  indicated that only one of her three brothers is alive. She indicated that both of her daughters are alive. She indicated that both of her sons are alive.  SOCIAL HISTORY:  reports that she has never smoked. She does not have any smokeless tobacco history on file. She reports that she does not drink alcohol or use illicit drugs.  REVIEW OF SYSTEMS:   Bolds are positive  Constitutional: weight loss, gain, night sweats, Fevers, chills, fatigue .  HEENT: headaches, Sore throat, sneezing, nasal congestion, post nasal drip, Difficulty swallowing, Tooth/dental problems, visual complaints visual changes, ear ache CV:  chest pain, radiates: ,Orthopnea, PND, swelling in lower extremities, dizziness, palpitations, syncope.  GI  heartburn, indigestion, abdominal pain, nausea, vomiting, diarrhea, change in bowel habits, loss of appetite, bloody stools.  Resp: cough, productive: , hemoptysis, dyspnea, chest pain, pleuritic.  Skin: rash or itching or icterus GU: dysuria, change in color of urine, urgency or frequency. flank pain, hematuria  MS: joint pain or swelling. decreased range of motion  Psych: change in mood or affect. depression or anxiety.  Neuro: difficulty with speech, weakness, numbness, ataxia    SUBJECTIVE:   VITAL SIGNS: Temp:  [97.5 F (36.4 C)-97.9 F (36.6 C)] 97.5 F (36.4 C) (06/14 0824) Pulse Rate:  [31-90] 72 (06/14 0800) Resp:  [13-25] 19 (06/14 0800) BP: (93-180)/(41-89) 128/57 mmHg (06/14 0800) SpO2:  [95 %-100 %] 98 % (06/14 0800) Weight:  [56.246 kg (124 lb)-61.9 kg (136 lb 7.4 oz)] 61.9 kg (136 lb 7.4 oz) (06/14 0259) HEMODYNAMICS:   VENTILATOR SETTINGS:   INTAKE / OUTPUT:  Intake/Output Summary (Last 24 hours) at 05/21/15 1039 Last data filed at 05/21/15 0900  Gross per 24 hour  Intake   4176 ml  Output  45 ml  Net   4131 ml    PHYSICAL EXAMINATION: General:  Elderly female in NAD Neuro:  Alert, oriented x 3. Non-focal HEENT:  [place]/AT, PERRL, no JVD  noted Cardiovascular:  RRR, no MRG Lungs:  Clear bilateral breath sounds Abdomen:  Soft, mildly tender, normal percussion.  Musculoskeletal:  No acute deformity or ROM limitation Skin:  No edema, grossly intact.   LABS:  CBC  Recent Labs Lab 05/20/15 2300 05/21/15 0031 05/21/15 0420 05/21/15 0735  WBC 12.7*  --  13.5* 12.9*  HGB 10.8* 11.2* 11.0* 10.8*  HCT 30.9* 33.0* 32.2* 31.7*  PLT 265  --  230 258   Coag's  Recent Labs Lab 05/20/15 2300 05/21/15 0420 05/21/15 0735  APTT  --   --  41*  INR 5.20* 6.09* 5.30*   BMET  Recent Labs Lab 05/20/15 2300 05/21/15 0017 05/21/15 0031 05/21/15 0420  NA 128*  --  128* 132*  K 7.1* 6.9* 6.7* 5.9*  CL 94*  --  104 103  CO2 12*  --   --  9*  BUN 72*  --  81* 69*  CREATININE 7.88*  --  7.70* 7.41*  GLUCOSE 154*  --  133* 121*   Electrolytes  Recent Labs Lab 05/20/15 2300 05/21/15 0420  CALCIUM 8.0* 7.6*   Sepsis Markers  Recent Labs Lab 05/20/15 2315 05/21/15 0420 05/21/15 0735  LATICACIDVEN 6.77* 7.7* 7.5*  PROCALCITON  --  32.08  --    ABG  Recent Labs Lab 05/21/15 0755  PHART 7.172*  PCO2ART 19.7*  PO2ART 87.7   Liver Enzymes  Recent Labs Lab 05/21/15 0420  AST 349*  ALT 192*  ALKPHOS 324*  BILITOT 0.7  ALBUMIN 3.0*   Cardiac Enzymes  Recent Labs Lab 05/20/15 2300 05/21/15 0420  TROPONINI 0.03 0.03   Glucose  Recent Labs Lab 05/21/15 0104 05/21/15 0450 05/21/15 0745  GLUCAP 125* 121* 142*    Imaging Ct Abdomen Pelvis Wo Contrast  05/21/2015   CLINICAL DATA:  Sepsis.  GI bleed.  Diarrhea.  Renal failure.  EXAM: CT ABDOMEN AND PELVIS WITHOUT CONTRAST  TECHNIQUE: Multidetector CT imaging of the abdomen and pelvis was performed following the standard protocol without IV contrast.  COMPARISON:  12/15/2003  FINDINGS: There is a moderate right pleural effusion and a small left pleural effusion. There is a small amount of ascites. Hepatic veins are somewhat distended as is the  inferior vena cava consistent with elevated right heart pressure.  No focal liver lesions. There are multiple stones in the gallbladder and the gallbladder is quite distended. No dilated bile ducts. Spleen, pancreas, and adrenal glands are normal. There is a single tiny stone in the mid right kidney and there are several tiny stones in the left kidney. No hydronephrosis. There is ascites peripheral to Gerota's fascia bilaterally. There is a tiny hiatal hernia. There are few diverticula in the distal colon but there is no evidence of diverticulitis. The mucosa of the distal sigmoid and rectum appears slightly prominent which could represent proctitis. The mucosa of the remainder of the colon appears normal. Terminal ileum and small bowel are normal.  Extensive calcification in the abdominal aorta and iliac arteries. Foley catheter in the bladder. Previous removal of the uterus and ovaries. No acute osseous abnormality.  IMPRESSION: 1. Bilateral pleural effusions, moderate on the right and small on the left. 2. Ascites. 3. Distended gallbladder with multiple gallstones. 4. Tiny bilateral renal stones. 5. Slight prominence of the mucosa in  the rectum and distal sigmoid. This could represent proctitis.   Electronically Signed   By: Lorriane Shire M.D.   On: 05/21/2015 10:33   Ct Head Wo Contrast  05/21/2015   CLINICAL DATA:  Altered mental status  EXAM: CT HEAD WITHOUT CONTRAST  TECHNIQUE: Contiguous axial images were obtained from the base of the skull through the vertex without intravenous contrast.  COMPARISON:  10/13/2012  FINDINGS: Insert motion images were repeated. Mild cortical volume loss noted with proportional ventricular prominence. Areas of periventricular white matter hypodensity are most compatible with small vessel ischemic change. No acute hemorrhage, infarct, or mass lesion is identified. Orbits and paranasal sinuses are unremarkable. No skull fracture.  IMPRESSION: No acute intracranial  abnormality. Stable degree of cortical volume loss.   Electronically Signed   By: Conchita Paris M.D.   On: 05/21/2015 10:01   Dg Chest Portable 1 View  05/20/2015   CLINICAL DATA:  Chest pain, hypotension, syncope  EXAM: PORTABLE CHEST - 1 VIEW  COMPARISON:  04/04/2014  FINDINGS: The heart size and mediastinal contours are within normal limits. Both lungs are clear. The visualized skeletal structures are unremarkable.  IMPRESSION: No active disease.   Electronically Signed   By: Lucienne Capers M.D.   On: 05/20/2015 23:35   Dg Abd Portable 1v  05/21/2015   CLINICAL DATA:  Chest and abdominal pain. Shortness of breath. Hypotension.  EXAM: PORTABLE ABDOMEN - 1 VIEW  COMPARISON:  01/07/2011  FINDINGS: The right abdomen and low pelvis are not included within the field of view. Superimposed structures obscure visualization of some of the left upper quadrant. Visualized bowel gas pattern is normal without significant colonic or small bowel distention. No radiopaque stones. Degenerative changes in the spine.  IMPRESSION: Limited study.  No evidence of bowel obstruction.   Electronically Signed   By: Lucienne Capers M.D.   On: 05/21/2015 00:00     ASSESSMENT / PLAN:  PULMONARY A: OSA on CPAP (noncompliant with this) Bilateral pleural effusion  P:   Supplemental O2 to keep SpO2 > 92% Monitor  CARDIOVASCULAR CVL >>> A:  Bradycardia, improved (thought secondary to hyperkalemia) PAF on coumadin, rate controlled H/o HTN  P:  Telemetry monitoring Aggressive IVF resuscitation Hold PO antihypertensives (lisinopril, metoprolol, spironolactone) PRN metoprolol to keep SBP < 160 Trend lactic Place CVL, CVP monitoring  RENAL A:   Acute Kidney Injury, ddx includes dehydration, sepsis, medication related Hyperkalemia High AG metabolic acidosis, lactic elevated, metformin Renal stones  P:   Volume Repeat Bmet now and every 4 hours Correct electrolytes as indicated May need CRRT Nephrology  consulted Bicarb gtt  GASTROINTESTINAL A:   Suspected C. difficile colitis Possible proctitis  Cholelitiasis  P:   NPO for now [place] ultrasound SUP: IV protonix  HEMATOLOGIC A:   Coumadin coagulopathy  P:  Hold coumadin FFP x 4 Vit K Follow Coags  INFECTIOUS A:   Severe sepsis. Uncertain etiology at this time. ddx includes C. Dif colitis, or other intra-abdominal source Suspect C. Difficile colitis  P:   BCx2 6/13 >>> C. Dif PCR 6/13 >>> Abx: Vanc, start date 6/14 >>> Abx: Zosyn, start date 6/13 >> Abx: Flagyl, start date 6/14 >>> Abx: PO Vanc, start date 6/14 >>> Trend PCT F/u US abdomen  ENDOCRINE A:  DM Hypothyroidism  P:   Dc metformin CBG monitoring and SSI Continue IV synthroid Check TSH Check Cortisol  NEUROLOGIC A:   No acute issues  P:   Monitor  FAMILY  -  Updates:   - Inter-disciplinary family meet or Palliative Care meeting due by:  6/21   Georgann Housekeeper, AGACNP-BC [place] [facility] Pager [phone] or [phone]  05/21/2015 11:52 AM   STAFF NOTE: Linwood Dibbles, MD FACP have personally reviewed patient's available data, including medical history, events of note, physical examination and test results as part of my evaluation. I have discussed with resident/NP and other care providers such as pharmacist, RN and RRT. In addition, I personally evaluated patient and elicited key findings of: appears awake, no distress despite PH, I am concerned about sepsis abdo source, [place] cdiff, add empiric oral vanc, IV flagyl for now, keep current empiric abdo coverage for traditional colitis, ffp then place line for resuscitation, place hD , in hopes to avoid only one access if cvvhd needed, icalled nreal, I updated son in full, repeat inr, assess LA repeat and LDH, move to icu, repeat ABG, early BIPAP if k rises, bicarb drip, volume, cvp The patient is critically ill with multiple organ systems failure and requires high  complexity decision making for assessment and support, frequent evaluation and titration of therapies, application of advanced monitoring technologies and extensive interpretation of multiple databases.   Critical Care Time devoted to patient care services described in this note is45 Minutes. This time reflects time of care of this signee: Merrie Roof, MD FACP. This critical care time does not reflect procedure time, or teaching time or supervisory time of PA/NP/Med student/Med Resident etc but could involve care discussion time. Rest per NP/medical resident whose note is outlined above and that I agree with   Lavon Paganini. Titus Mould, MD, [place] Pgr: [facility] 05/21/2015 12:10 PM

## 2015-05-21 NOTE — Progress Notes (Signed)
Foley catheter was inserted at 0430 per doctors order by Remo Lipps, RN and Reatha Armour, RN.  Peri care was done beforehand and sterile technique was maintained. Foley was inserted due to renal labs elevated and need for strict i/o.  Pt tolerated procedure well and RN will continue to monitor.

## 2015-05-21 NOTE — Procedures (Signed)
Central Venous Catheter Insertion Procedure Note MONTSERRATH MADDING [id number] 1925-12-06  Procedure: Insertion of Central Venous Catheter Indications: Assessment of intravascular volume, Drug and/or fluid administration, Frequent blood sampling and possibel  possibel HD  Procedure Details Consent: Risks of procedure as well as the alternatives and risks of each were explained to the (patient/caregiver).  Consent for procedure obtained. Time Out: Verified patient identification, verified procedure, site/side was marked, verified correct patient position, special equipment/implants available, medications/allergies/relevent history reviewed, required imaging and test results available.  Performed  Maximum sterile technique was used including antiseptics, cap, gloves, gown, hand hygiene, mask and sheet. Skin prep: Chlorhexidine; local anesthetic administered A antimicrobial bonded/coated triple lumen catheter was placed in the right internal jugular vein using the Seldinger technique.  Evaluation Blood flow good Complications: No apparent complications Patient did tolerate procedure well. Chest X-ray ordered to verify placement.  CXR: pending.  Raylene Miyamoto 05/21/2015, 3:13 PM  [place]  Lavon Paganini. Titus Mould, MD, [place] Pgr: [facility]

## 2015-05-21 NOTE — Progress Notes (Signed)
[place] for patient to transport off monitor for CT scan per Dr. Verlon Au via telephone.

## 2015-05-21 NOTE — Consult Note (Signed)
Jeralene Huff Admit Date: 05/20/2015 05/21/2015 Rexene Agent Requesting Physician:  Titus Mould MD  Reason for Consult:  Hyperkalemia, AKI, Metabolic Acidosis HPI:  81 year old female seen at the request of Dr. Titus Mould for the above mentioned issues. Patient's past pertinent history includes hypertension (on lisinopril and spironolactone, amongst other medications), DM2 on metformin, paroxysmal atrial fibrillation on warfarin, and has normal baseline serum creatinine when last checked 03/2015. She was admitted overnight with nausea, vomiting, diarrhea. She was treated for an upper respiratory infection with azithromycin at which point the GI symptoms began. The diarrhea was reported to be accompanied by bright red blood. The patient endorses poor by mouth intake preceding her admission. Upon evaluation in the emergency room her creatinine was 7.88 with a potassium of 7.1 and serum bicarbonate of 12 with an anion gap of 22 and an albumin of 3. Venous lactate was elevated at 7. Her calcitonin is elevated at 32. Hyperkalemia was treated with bicarbonate, insulin. She has been hydrated with normal saline.  Patient complains of diffuse abdominal pain. CT of the abdomen and pelvis overnight demonstrated bilateral pleural effusions, small volume ascites, gallbladder distention with cholelithiasis, and nonobstructing renal stones with no evidence of hydronephrosis. She has a Foley catheter.  She has started on vancomycin by mouth, Zosyn, IV metronidazole. Antihypertensives and diuretics have been held. She has had minimal urine output since admission.  CREATININE, SER (mg/dL)  Date Value  05/21/2015 7.22*  05/21/2015 7.41*  05/21/2015 7.70*  05/20/2015 7.88*  03/28/2015 1.00  11/15/2014 1.26*  07/12/2014 1.19*  04/04/2014 0.93  03/13/2014 1.07*  12/08/2013 0.96  ] I/Os:  ROS NSAIDS: unknown if used any preceding admission IV Contrast no exposure identified TMP/SMX no exposure  identified Hypotension mild, present on admission Balance of 12 systems is negative w/ exceptions as above  PMH  Past Medical History  Diagnosis Date  . OSA (obstructive sleep apnea)     AHI-44/hr, AHI during REM 61.8/hr, avg O2 during REM and NREM 96%  . Atrial fibrillation   . Myocardial infarct   . Diabetes mellitus   . Hearing loss   . Hypertension   . Hyperlipidemia   . Senile degeneration of brain   . Other generalized ischemic cerebrovascular disease   . Personal history of fall   . Pain in joint, shoulder region   . 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 Loss of weight   . Chronic kidney disease, stage II (mild)   . Obstructive sleep apnea (adult) (pediatric)   . Senile degeneration of brain   . Dizziness and giddiness   . Personal history of noncompliance with medical treatment, presenting hazards to health   . Dyskinesia of esophagus   . Shortness of breath   . Chest pain, unspecified 06/19/2009  . Long term (current) use of anticoagulants   . Unspecified hereditary and idiopathic peripheral neuropathy   . Pain in joint, site unspecified   . Carpal tunnel syndrome   . Other hammer toe (acquired)   . Alopecia areata   . Dysphagia, unspecified(787.20)   . Occlusion and stenosis of carotid artery without mention of cerebral infarction   . Other symptoms involving cardiovascular system   . Other acne   . Allergic rhinitis, cause unspecified   . Type II or unspecified type diabetes mellitus without mention of complication, uncontrolled   . Varicose veins of lower extremities   . Cervicalgia   . Disturbance of salivary secretion   . Insomnia, unspecified   .  Unspecified hypothyroidism   . Palpitations 715.90  . Other malaise and fatigue   . Diverticulosis of colon (without mention of hemorrhage)   . Benign neoplasm of colon   . Depressive disorder, not elsewhere classified   . Syncope and collapse   . Paroxysmal atrial fibrillation   .  Hypertension   . Dizziness    PSH  Past Surgical History  Procedure Laterality Date  . Total abdominal hysterectomy    . Tonsillectomy    . Appendectomy    . Hand surgery    . Cataract od  10/04/2012  . Cataract os  01/31/2013  . Carotid doppler  07/25/2012    Bilat internal carotid arteries demonstrate 1-395 stenoses.  . Transthoracic echocardiogram  04/24/2011    EF >55%, moderate tricuspid regurg.   FH  Family History  Problem Relation Age of Onset  . Cervical cancer Mother   . Cancer Mother   . Parkinsonism Father   . Sleep apnea Brother   . Diabetes Brother   . Sleep apnea Brother   . Peripheral vascular disease Brother   . Cancer Daughter    SH  reports that she has never smoked. She does not have any smokeless tobacco history on file. She reports that she does not drink alcohol or use illicit drugs. Allergies No Known Allergies Home medications Prior to Admission medications   Medication Sig Start Date End Date Taking? Authorizing Provider  estradiol (ESTRACE) 0.5 MG tablet TAKE 1 TABLET EVERY DAY FOR HORMONES 11/20/14  Yes Estill Dooms, MD  hydroxypropyl methylcellulose (ISOPTO TEARS) 2.5 % ophthalmic solution Place 1 drop into both eyes as needed for dry eyes.   Yes Historical Provider, MD  levothyroxine (SYNTHROID, LEVOTHROID) 50 MCG tablet TAKE 1 TABLET ON [facility] THROUGH FRIDAY FOR THYROID SUPPLEMENT 11/26/14  Yes Lauree Chandler, NP  levothyroxine (SYNTHROID, LEVOTHROID) 75 MCG tablet TAKE 1 TABLET BY MOUTH ON MONDAY,TUESDAY,THURSDAY,SATURDAY AND SUNDAY 02/13/15  Yes Estill Dooms, MD  lisinopril (PRINIVIL,ZESTRIL) 5 MG tablet TAKE 1 TABLET BY MOUTH DAILY. 11/21/14  Yes Lorretta Harp, MD  metFORMIN (GLUCOPHAGE) 500 MG tablet TAKE 2 TABLETS BY MOUTH EVERY MORNING AND 1 TAB AT SUPPER TO CONTROL BLOOD SUGAR 05/20/15  Yes Estill Dooms, MD  metoprolol (LOPRESSOR) 50 MG tablet TAKE 1 TABLET BY MOUTH TWICE A DAY FOR BLOOD PRESSURE 04/22/15  Yes Estill Dooms, MD   spironolactone (ALDACTONE) 25 MG tablet TAKE 1 TABLET BY MOUTH TWICE A DAY TO STRENGTHEN THE HEART AND REGULATE BLOOD PRESSURE 12/25/14  Yes Estill Dooms, MD  warfarin (COUMADIN) 5 MG tablet Take 1 to 1.5 tablets by mouth daily as directed by coumadin clinic Patient taking differently: Take 5-7.5 mg by mouth daily. Take 5mg  on Sunday, Tuesday, Thursday and Saturday. Take 5mg  on all other days of the week. 05/03/15  Yes Lorretta Harp, MD    Current Medications Scheduled Meds: . sodium chloride   Intravenous Once  . sodium chloride   Intravenous Once  . insulin aspart  0-9 Units Subcutaneous 6 times per day  . levothyroxine  37.5 mcg Intravenous Daily  . metronidazole  500 mg Intravenous Q8H  . pantoprazole (PROTONIX) IV  40 mg Intravenous Q12H  . phytonadione  10 mg Subcutaneous Once  . piperacillin-tazobactam (ZOSYN)  IV  2.25 g Intravenous 3 times per day  . sodium chloride  1,000 mL Intravenous Once  . vancomycin  125 mg Oral 4 times per day   Continuous Infusions: .  sodium chloride 150 mL/hr at 05/21/15 1000  .  sodium bicarbonate  infusion 1000 mL 75 mL/hr at 05/21/15 1203   PRN Meds:.metoprolol, ondansetron **OR** ondansetron (ZOFRAN) IV  CBC  Recent Labs Lab 05/20/15 2300  05/21/15 0420 05/21/15 0735 05/21/15 1126  WBC 12.7*  --  13.5* 12.9* 11.3*  NEUTROABS 8.9*  --   --   --   --   HGB 10.8*  < > 11.0* 10.8* 10.3*  HCT 30.9*  < > 32.2* 31.7* 29.9*  MCV 95.1  --  96.1 96.1 95.2  PLT 265  --  230 258 231  < > = values in this interval not displayed. Basic Metabolic Panel  Recent Labs Lab 05/20/15 2300 05/21/15 0017 05/21/15 0031 05/21/15 0420 05/21/15 1118  NA 128*  --  128* 132* 131*  K 7.1* 6.9* 6.7* 5.9* 5.9*  CL 94*  --  104 103 103  CO2 12*  --   --  9* 9*  GLUCOSE 154*  --  133* 121* 109*  BUN 72*  --  81* 69* 66*  CREATININE 7.88*  --  7.70* 7.41* 7.22*  CALCIUM 8.0*  --   --  7.6* 7.4*    Physical Exam  Blood pressure 158/63, pulse 73,  temperature 97.4 F (36.3 C), temperature source Oral, resp. rate 22, height 5\' 4"  (1.626 m), weight 61.9 kg (136 lb 7.4 oz), SpO2 98 %. GEN: NAD, confused, awake/alert ENT: NCAT, dry MM,  EYES: EOMI CV: RRR, no rub PULM: CTAB ABD: mild ttp throughout, no r/g, quiet SKIN: no rashes EXT:no LEE   Assessment/Plan 55F with normal baseline GFR admitted with AKI, Hyperkalemia, Inc AG Metabolic Acidosis in setting of N/V/D, recent azithromycin for URI, and use of ACEi, MRB, and metformin at home.    1. AKI 1. NOrmal baseline SCr 2. Suspect 2/2 Hypovolemia + ACEi + MRB +/- ATN 3. Very well could need RRT, could recover with support / volume replacement 4. Agree with placing HD catheter 5. Checking BMP q4h for now 2. Hyperkalemia 1. 2/2 ACEi + MRB + #1 2. Improving with volume replacement, holding medications 3. Needs Bicarbonate replacement 4. Follow closely, might need RRT 5. EKG 6/13 w/o Peaked Ts,  AFib present with SVR 3. Inc AG Metabolic Acidosis 1. Likely from sepsis + metformin 2. Agree with NaHCO3 replacement currently; will help with #2 4. N/V/D + Hypovolemia prt critical care 5. ? Colits vs C diff colitis  1. On zosyn, flagyl, po vanc 6. HTN: holding BP meds 7. DM2: holding metformin   Pearson Grippe MD [phone] pgr 05/21/2015, 12:14 PM

## 2015-05-22 ENCOUNTER — Inpatient Hospital Stay (HOSPITAL_COMMUNITY): Payer: Medicare Other

## 2015-05-22 DIAGNOSIS — J9601 Acute respiratory failure with hypoxia: Secondary | ICD-10-CM

## 2015-05-22 DIAGNOSIS — N179 Acute kidney failure, unspecified: Secondary | ICD-10-CM

## 2015-05-22 DIAGNOSIS — I481 Persistent atrial fibrillation: Secondary | ICD-10-CM

## 2015-05-22 DIAGNOSIS — E875 Hyperkalemia: Secondary | ICD-10-CM

## 2015-05-22 LAB — PREPARE FRESH FROZEN PLASMA
UNIT DIVISION: 0
Unit division: 0
Unit division: 0
Unit division: 0

## 2015-05-22 LAB — BLOOD GAS, ARTERIAL
ACID-BASE DEFICIT: 8.1 mmol/L — AB (ref 0.0–2.0)
BICARBONATE: 16.3 meq/L — AB (ref 20.0–24.0)
Drawn by: 227661
FIO2: 1 %
LHR: 16 {breaths}/min
MECHVT: 500 mL
O2 Saturation: 97.7 %
PEEP: 5 cmH2O
PO2 ART: 114 mmHg — AB (ref 80.0–100.0)
Patient temperature: 98.6
TCO2: 17.2 mmol/L (ref 0–100)
pCO2 arterial: 30 mmHg — ABNORMAL LOW (ref 35.0–45.0)
pH, Arterial: 7.354 (ref 7.350–7.450)

## 2015-05-22 LAB — POCT I-STAT 3, ART BLOOD GAS (G3+)
ACID-BASE DEFICIT: 11 mmol/L — AB (ref 0.0–2.0)
Bicarbonate: 15.4 mEq/L — ABNORMAL LOW (ref 20.0–24.0)
O2 SAT: 90 %
PO2 ART: 65 mmHg — AB (ref 80.0–100.0)
TCO2: 16 mmol/L (ref 0–100)
pCO2 arterial: 33.2 mmHg — ABNORMAL LOW (ref 35.0–45.0)
pH, Arterial: 7.272 — ABNORMAL LOW (ref 7.350–7.450)

## 2015-05-22 LAB — BASIC METABOLIC PANEL
ANION GAP: 16 — AB (ref 5–15)
Anion gap: 16 — ABNORMAL HIGH (ref 5–15)
BUN: 58 mg/dL — ABNORMAL HIGH (ref 6–20)
BUN: 59 mg/dL — AB (ref 6–20)
CALCIUM: 7.4 mg/dL — AB (ref 8.9–10.3)
CO2: 17 mmol/L — ABNORMAL LOW (ref 22–32)
CO2: 17 mmol/L — ABNORMAL LOW (ref 22–32)
Calcium: 7.4 mg/dL — ABNORMAL LOW (ref 8.9–10.3)
Chloride: 98 mmol/L — ABNORMAL LOW (ref 101–111)
Chloride: 97 mmol/L — ABNORMAL LOW (ref 101–111)
Creatinine, Ser: 6.61 mg/dL — ABNORMAL HIGH (ref 0.44–1.00)
Creatinine, Ser: 6.63 mg/dL — ABNORMAL HIGH (ref 0.44–1.00)
GFR calc Af Amer: 6 mL/min — ABNORMAL LOW (ref >60)
GFR calc non Af Amer: 5 mL/min — ABNORMAL LOW (ref >60)
GFR, EST AFRICAN AMERICAN: 6 mL/min — AB (ref >60)
GFR, EST NON AFRICAN AMERICAN: 5 mL/min — AB (ref >60)
GLUCOSE: 166 mg/dL — AB (ref 65–99)
Glucose, Bld: 175 mg/dL — ABNORMAL HIGH (ref 65–99)
Potassium: 4.9 mmol/L (ref 3.5–5.1)
Potassium: 4.7 mmol/L (ref 3.5–5.1)
Sodium: 131 mmol/L — ABNORMAL LOW (ref 135–145)
Sodium: 130 mmol/L — ABNORMAL LOW (ref 135–145)

## 2015-05-22 LAB — GLUCOSE, CAPILLARY
GLUCOSE-CAPILLARY: 163 mg/dL — AB (ref 65–99)
GLUCOSE-CAPILLARY: 102 mg/dL — AB (ref 65–99)
GLUCOSE-CAPILLARY: 94 mg/dL (ref 65–99)
Glucose-Capillary: 107 mg/dL — ABNORMAL HIGH (ref 65–99)
Glucose-Capillary: 161 mg/dL — ABNORMAL HIGH (ref 65–99)
Glucose-Capillary: 96 mg/dL (ref 65–99)

## 2015-05-22 LAB — PROTIME-INR
INR: 1.77 — ABNORMAL HIGH (ref 0.00–1.49)
Prothrombin Time: 20.6 seconds — ABNORMAL HIGH (ref 11.6–15.2)

## 2015-05-22 LAB — LACTIC ACID, PLASMA: Lactic Acid, Venous: 3.2 mmol/L (ref 0.5–2.0)

## 2015-05-22 MED ORDER — HEPARIN SODIUM (PORCINE) 1000 UNIT/ML DIALYSIS: 1000.0000 [IU] | INTRAMUSCULAR | Status: DC | PRN

## 2015-05-22 MED ORDER — MIDAZOLAM HCL 2 MG/2ML IJ SOLN
1.0000 mg | INTRAMUSCULAR | Status: DC | PRN
Start: 2015-05-22 — End: 2015-05-24

## 2015-05-22 MED ORDER — FENTANYL CITRATE (PF) 100 MCG/2ML IJ SOLN
INTRAMUSCULAR | Status: AC
  Administered 2015-05-22: 100 ug
  Filled 2015-05-22: qty 4

## 2015-05-22 MED ORDER — PHENYLEPHRINE HCL 10 MG/ML IJ SOLN
30.0000 ug/min | INTRAMUSCULAR | Status: DC
  Administered 2015-05-22: 30 ug/min via INTRAVENOUS
  Administered 2015-05-23: 10 ug/min via INTRAVENOUS
  Filled 2015-05-22 (×2): qty 1

## 2015-05-22 MED ORDER — SODIUM CHLORIDE 0.9 % IV SOLN: 100.0000 mL | INTRAVENOUS | Status: DC | PRN

## 2015-05-22 MED ORDER — CHLORHEXIDINE GLUCONATE 0.12 % MT SOLN
15.0000 mL | Freq: Two times a day (BID) | OROMUCOSAL | Status: DC
  Administered 2015-05-22 – 2015-05-23 (×4): 15 mL via OROMUCOSAL
  Filled 2015-05-22 (×4): qty 15

## 2015-05-22 MED ORDER — DEXMEDETOMIDINE HCL IN NACL 200 MCG/50ML IV SOLN
0.0000 ug/kg/h | INTRAVENOUS | Status: DC
  Administered 2015-05-22 (×2): 0.4 ug/kg/h via INTRAVENOUS
  Filled 2015-05-22 (×2): qty 50

## 2015-05-22 MED ORDER — ALTEPLASE 2 MG IJ SOLR
2.0000 mg | Freq: Once | INTRAMUSCULAR | Status: AC | PRN
  Filled 2015-05-22: qty 2

## 2015-05-22 MED ORDER — SODIUM CHLORIDE 0.9 % IV SOLN
25.0000 ug/h | INTRAVENOUS | Status: DC
  Administered 2015-05-22: 25 ug/h via INTRAVENOUS
  Filled 2015-05-22: qty 50

## 2015-05-22 MED ORDER — VANCOMYCIN HCL 1000 MG IV SOLR
750.0000 mg | Freq: Once | INTRAVENOUS | Status: AC
  Administered 2015-05-22: 750 mg via INTRAVENOUS
  Filled 2015-05-22 (×2): qty 750

## 2015-05-22 MED ORDER — FUROSEMIDE 10 MG/ML IJ SOLN
160.0000 mg | Freq: Once | INTRAVENOUS | Status: AC
  Administered 2015-05-22: 160 mg via INTRAVENOUS
  Filled 2015-05-22: qty 16

## 2015-05-22 MED ORDER — CETYLPYRIDINIUM CHLORIDE 0.05 % MT LIQD
7.0000 mL | Freq: Four times a day (QID) | OROMUCOSAL | Status: DC
  Administered 2015-05-22 – 2015-05-24 (×8): 7 mL via OROMUCOSAL

## 2015-05-22 MED ORDER — FENTANYL CITRATE (PF) 100 MCG/2ML IJ SOLN
50.0000 ug | INTRAMUSCULAR | Status: DC | PRN
  Administered 2015-05-22: 50 ug via INTRAVENOUS
  Filled 2015-05-22: qty 2

## 2015-05-22 MED ORDER — FENTANYL CITRATE (PF) 100 MCG/2ML IJ SOLN
25.0000 ug | INTRAMUSCULAR | Status: DC | PRN
  Administered 2015-05-22 (×4): 25 ug via INTRAVENOUS
  Filled 2015-05-22 (×4): qty 2

## 2015-05-22 MED ORDER — MIDAZOLAM HCL 2 MG/2ML IJ SOLN
INTRAMUSCULAR | Status: AC
  Administered 2015-05-22: 2 mg
  Filled 2015-05-22: qty 4

## 2015-05-22 MED ORDER — NEPRO/CARBSTEADY PO LIQD
237.0000 mL | ORAL | Status: DC | PRN
  Filled 2015-05-22: qty 237

## 2015-05-22 MED ORDER — MIDAZOLAM HCL 2 MG/2ML IJ SOLN: 1.0000 mg | INTRAMUSCULAR | Status: DC | PRN

## 2015-05-22 MED ORDER — FENTANYL CITRATE (PF) 100 MCG/2ML IJ SOLN
50.0000 ug | INTRAMUSCULAR | Status: DC | PRN
Start: 2015-05-22 — End: 2015-05-22

## 2015-05-22 NOTE — Progress Notes (Signed)
Initial Nutrition Assessment  DOCUMENTATION CODES:  Not applicable  INTERVENTION:   Recommend start TF if unable to extubate within the next 24 hours: initiate TF via OGT with Vital AF 1.2 at 25 ml/h and Prostat 30 ml once daily on day 1; on day 2, increase to goal rate of 40 ml/h (960 ml per day) to provide 1252 kcals, 87 gm protein, 779 ml free water daily.   NUTRITION DIAGNOSIS:  Inadequate oral intake related to inability to eat as evidenced by NPO status.  GOAL:  Patient will meet greater than or equal to 90% of their needs  MONITOR:  Labs, Weight trends, Vent status (ability to begin nutrition support)  REASON FOR ASSESSMENT:  Malnutrition Screening Tool, Ventilator    ASSESSMENT:  Patient was brought to the ED after so found the patient was increasingly weak, confused, and having persistent nausea and vomiting. Found to be coagulopathic, hypotensive, bradycardic, and with hyperkalemia.  Nutrition-Focused physical exam completed. Findings are mild-moderate muscle depletion and no edema.   Patient is currently intubated on ventilator support MV: 6.5 L/min Temp (24hrs), Avg:97.9 F (36.6 C), Min:97.4 F (36.3 C), Max:98.5 F (36.9 C)  Propofol: none   Labs reviewed: sodium low, BUN/creatinine high  Height:  Ht Readings from Last 1 Encounters:  05/22/15 5\' 8"  (1.727 m)    Weight:  Wt Readings from Last 1 Encounters:  05/22/15 149 lb 7.6 oz (67.8 kg)    Ideal Body Weight:  63.6 kg  Wt Readings from Last 10 Encounters:  05/22/15 149 lb 7.6 oz (67.8 kg)  04/03/15 126 lb 3.2 oz (57.244 kg)  01/09/15 126 lb (57.153 kg)  11/20/14 122 lb 3.2 oz (55.43 kg)  07/23/14 123 lb 6.4 oz (55.974 kg)  07/17/14 122 lb 3.2 oz (55.43 kg)  04/18/14 124 lb (56.246 kg)  12/12/13 121 lb 12.8 oz (55.248 kg)  10/26/13 122 lb (55.339 kg)  08/22/13 118 lb (53.524 kg)    BMI:  Body mass index is 22.73 kg/(m^2).  Estimated Nutritional Needs:  Kcal:  1273  Protein:   85-95 gm  Fluid:  1.5 L  Skin:  Reviewed, no issues  Diet Order:  Diet NPO time specified Except for: Sips with Meds  EDUCATION NEEDS:  No education needs identified at this time   Intake/Output Summary (Last 24 hours) at 05/22/15 1454 Last data filed at 05/22/15 1400  Gross per 24 hour  Intake 3554.44 ml  Output   2550 ml  Net 1004.44 ml    Last BM:  6/14   Molli Barrows, RD, LDN, [place] Pager [phone] After Hours Pager [phone]

## 2015-05-22 NOTE — Progress Notes (Signed)
PCCM Interval Note  Called by Warren Lacy MD to assess pt at bedside for respiratory distress.  Per RN, pt suddenly sat up on side of bed with increased WOB.  Lung fields with crackles throughout.  Warren Lacy MD d/c'd IVF's and started 160mg  lasix.  Per RN, pt much improved after a few minutes with improvement in SpO2 on venti mask; however, would occasionally become restless and desaturate.  On exam, she has diffuse crackles throughout bilateral lung fields.  She is awake and alert and in no acute distress (improvement per RN).  I have ordered CXR and will start BiPAP.  Day team to follow up.   Montey Hora, [facility] Pager: [phone]  or [phone] 05/22/2015, 6:00 AM

## 2015-05-22 NOTE — Consult Note (Deleted)
PULMONARY / CRITICAL CARE MEDICINE   Name: [name] MRN: [id number] DOB: 01/26/1925    ADMISSION DATE:  05/20/2015 CONSULTATION DATE:  05/21/2015  REFERRING MD :  Dr. Verlon Au  CHIEF COMPLAINT:  Abd pain  INITIAL PRESENTATION: 81 year old female who was admitted 6/13 with acute kidney injury with hyperkalemia and lactic acidosis. Also coagulopathic from coumadin. Limited improvement with ABX and volume. To ICU for PCCM management 6/14.  STUDIES:  CT abdomen 6/13 > Bilateral pleural effusions, moderate on the right and small on the left. Ascites. Distended gallbladder with multiple gallstones. Tiny bilateral renal stones. Slight prominence of the mucosa in the rectum and distal sigmoid.This could represent proctitis CT head 6/13 > No acute intracranial abnormality. Stable degree of cortical volume loss. 6/14 [place] abd unremarkable    SIGNIFICANT EVENTS: 6/13 > admitted for AKI with AMS and diarrhea 6/14 > Profound acidosis, transferred to ICU. PCCM assumed care.  6/15 intubated for acute resp distress after failing bipap.  HISTORY OF PRESENT ILLNESS:  81 y/o female P afib followed by Dr. Gwenlyn Found [prior Amiodarone, CHad2Vasc2 score~6, Hasbled score ? , Cardiac Cath 04/21/2007-No isch, severe OSA by sleep study 04/2011 [ non tolerant CPAP-last seen Dr. Gwenette Greet 2013], prior falls with Hip hematoma 12/13/11, bilateral non severe Cartoid stenosis foll by Dr. Trula Slade L>R based on 07/29/11 Carotid Duplex, NIDDM, HTN, prior syncope 04/2007. Has had prior diverticular bleed in the past and lasty colonoscopy was by ? Dr. Lajoyce Corners in 2001. Mrs. Lacour recently had some productive cough and was started on Zpak for this 5 days PTA, after one day on the antibiotic she developed diarrhea with rectal bleeding which progressed to nausea and vomiting. Patient's son also noted that she may have been taking more of her medication than she was supposed to after doing his pill counts. She presented to ED 6/13 with these  complaints as well as some confusion. She was found to be coagulopathic as she had been taking extra coumadin. Her renal function was also markedly elevated from baseline with hyperkalemia. She had bradycardia which was thought secondary to that. Lactic acid found to be elevated. She was admitted for AKI, coagulopathy, and diarrhea. Of note she is also on metformin. Renal function and lactic with only minimal improvements since that time, PCCM consulted for further evaluation.      SUBJECTIVE:  Acute resp distress  VITAL SIGNS: Temp:  [96.7 F (35.9 C)-98.5 F (36.9 C)] 97.4 F (36.3 C) (06/15 0402) Pulse Rate:  [72-88] 82 (06/15 0600) Resp:  [17-31] 25 (06/15 0600) BP: (128-200)/(52-172) 192/100 mmHg (06/15 0600) SpO2:  [90 %-100 %] 98 % (06/15 0600) Weight:  [149 lb 7.6 oz (67.8 kg)] 149 lb 7.6 oz (67.8 kg) (06/15 0500) HEMODYNAMICS: CVP:  [18 mmHg-22 mmHg] 18 mmHg VENTILATOR SETTINGS: Vent Mode:  [-] BIPAP INTAKE / OUTPUT:  Intake/Output Summary (Last 24 hours) at 05/22/15 0746 Last data filed at 05/22/15 2542  Gross per 24 hour  Intake 5141.34 ml  Output   1460 ml  Net 3681.34 ml    PHYSICAL EXAMINATION: General:  Elderly female in NAD, in obvious resp distress Neuro:  Anxious, confused, prior to intubation HEENT:  [place]/AT, PERRL, no JVD noted Cardiovascular:  RRR, no MRG Lungs:  Bilateral basilar crackles prior to intubation Abdomen:  Soft, mildly tender, normal percussion.  Musculoskeletal:  No acute deformity or ROM limitation Skin:  No edema, grossly intact.   LABS:  CBC  Recent Labs Lab 05/21/15 0420 05/21/15 0735 05/21/15  1126  WBC 13.5* 12.9* 11.3*  HGB 11.0* 10.8* 10.3*  HCT 32.2* 31.7* 29.9*  PLT 230 258 231   Coag's  Recent Labs Lab 05/21/15 0735 05/21/15 1629 05/22/15 0420  APTT 41*  --   --   INR 5.30* 1.92* 1.77*   BMET  Recent Labs Lab 05/21/15 2100 05/22/15 0100 05/22/15 0420  NA 131* 131* 130*  K 5.1 4.9 4.7  CL 99* 98* 97*   CO2 15* 17* 17*  BUN 62* 58* 59*  CREATININE 6.70* 6.61* 6.63*  GLUCOSE 184* 175* 166*   Electrolytes  Recent Labs Lab 05/21/15 2100 05/22/15 0100 05/22/15 0420  CALCIUM 7.4* 7.4* 7.4*   Sepsis Markers  Recent Labs Lab 05/21/15 0420 05/21/15 0735 05/21/15 0948  LATICACIDVEN 7.7* 7.5* 7.2*  PROCALCITON 32.08  --   --    ABG  Recent Labs Lab 05/21/15 0755 05/22/15 0450  PHART 7.172* 7.272*  PCO2ART 19.7* 33.2*  PO2ART 87.7 65.0*   Liver Enzymes  Recent Labs Lab 05/21/15 0420  AST 349*  ALT 192*  ALKPHOS 324*  BILITOT 0.7  ALBUMIN 3.0*   Cardiac Enzymes  Recent Labs Lab 05/20/15 2300 05/21/15 0420  TROPONINI 0.03 0.03   Glucose  Recent Labs Lab 05/21/15 1116 05/21/15 1201 05/21/15 1543 05/21/15 2033 05/22/15 0040 05/22/15 0400  GLUCAP 106* 100* 146* 153* 161* 163*    Imaging Ct Abdomen Pelvis Wo Contrast  05/21/2015   CLINICAL DATA:  Sepsis.  GI bleed.  Diarrhea.  Renal failure.  EXAM: CT ABDOMEN AND PELVIS WITHOUT CONTRAST  TECHNIQUE: Multidetector CT imaging of the abdomen and pelvis was performed following the standard protocol without IV contrast.  COMPARISON:  12/15/2003  FINDINGS: There is a moderate right pleural effusion and a small left pleural effusion. There is a small amount of ascites. Hepatic veins are somewhat distended as is the inferior vena cava consistent with elevated right heart pressure.  No focal liver lesions. There are multiple stones in the gallbladder and the gallbladder is quite distended. No dilated bile ducts. Spleen, pancreas, and adrenal glands are normal. There is a single tiny stone in the mid right kidney and there are several tiny stones in the left kidney. No hydronephrosis. There is ascites peripheral to Gerota's fascia bilaterally. There is a tiny hiatal hernia. There are few diverticula in the distal colon but there is no evidence of diverticulitis. The mucosa of the distal sigmoid and rectum appears slightly  prominent which could represent proctitis. The mucosa of the remainder of the colon appears normal. Terminal ileum and small bowel are normal.  Extensive calcification in the abdominal aorta and iliac arteries. Foley catheter in the bladder. Previous removal of the uterus and ovaries. No acute osseous abnormality.  IMPRESSION: 1. Bilateral pleural effusions, moderate on the right and small on the left. 2. Ascites. 3. Distended gallbladder with multiple gallstones. 4. Tiny bilateral renal stones. 5. Slight prominence of the mucosa in the rectum and distal sigmoid. This could represent proctitis.   Electronically Signed   By: Lorriane Shire M.D.   On: 05/21/2015 10:33   Ct Head Wo Contrast  05/21/2015   CLINICAL DATA:  Altered mental status  EXAM: CT HEAD WITHOUT CONTRAST  TECHNIQUE: Contiguous axial images were obtained from the base of the skull through the vertex without intravenous contrast.  COMPARISON:  10/13/2012  FINDINGS: Insert motion images were repeated. Mild cortical volume loss noted with proportional ventricular prominence. Areas of periventricular white matter hypodensity are most compatible with  small vessel ischemic change. No acute hemorrhage, infarct, or mass lesion is identified. Orbits and paranasal sinuses are unremarkable. No skull fracture.  IMPRESSION: No acute intracranial abnormality. Stable degree of cortical volume loss.   Electronically Signed   By: Conchita Paris M.D.   On: 05/21/2015 10:01   US Abdomen Complete  05/21/2015   CLINICAL DATA:  Abdominal pain.  Gallstones.  EXAM: ULTRASOUND ABDOMEN COMPLETE  COMPARISON:  CT abdomen and pelvis earlier this same day.  FINDINGS: Gallbladder: Stones are identified within the gallbladder measuring up to 1.5 cm. The gallbladder wall is thickened at 0.7 cm. Sonographer reports negative Murphy's sign.  Common bile duct: Diameter: 0.3 cm.  Liver: No focal lesion identified. Within normal limits in parenchymal echogenicity.  IVC: No  abnormality visualized.  Pancreas: Visualized portion unremarkable.  Spleen: Size and appearance within normal limits.  Right Kidney: Length: 10.9 cm. Echogenicity within normal limits. No hydronephrosis visualized. A 0.8 cm hyperechoic lesion in the lower pole of the left kidney cannot be definitively characterized but likely represents a complex cyst.  Left Kidney: Length: 10.9 cm. Echogenicity within normal limits. No mass or hydronephrosis visualized.  Abdominal aorta: No aneurysm visualized.  Other findings: There is a small volume of abdominal ascites. Bilateral pleural effusions are seen, greater on the right.  IMPRESSION: Gallstones without evidence of cholecystitis. Gallbladder wall thickening is likely related to small volume of abdominal ascites.  Bilateral pleural effusions.   Electronically Signed   By: Inge Rise M.D.   On: 05/21/2015 14:04   Dg Chest Port 1 View  05/22/2015   CLINICAL DATA:  Decreased oxygen saturation and shortness of breath  EXAM: PORTABLE CHEST - 1 VIEW  COMPARISON:  May 21, 2015  FINDINGS: Central catheter tip is in the superior cava. No pneumothorax. There is increase in interstitial edema. There is a new right pleural effusion. Heart is mildly enlarged with mild pulmonary venous hypertension. There is atelectatic change superimposed on edema in the left base. No adenopathy.  IMPRESSION: Congestive heart failure with new right effusion increased edema compared to 1 day prior.   Electronically Signed   By: Lowella Grip III M.D.   On: 05/22/2015 07:26   Dg Chest Port 1 View  05/21/2015   CLINICAL DATA:  Followup acute renal failure  EXAM: PORTABLE CHEST - 1 VIEW  COMPARISON:  05/20/2015  FINDINGS: Right IJ central line, tip at the SVC level.  No pneumothorax.  New diffuse interstitial opacity, compatible with edema. Normal heart size and stable aortic tortuosity, accentuated by rotation. Hyperinflation which is stable from previous. No emphysematous changes on  04/25/2014 chest CT.  IMPRESSION: 1. New right IJ central line.  No pneumothorax. 2. New mild pulmonary edema.   Electronically Signed   By: Monte Fantasia M.D.   On: 05/21/2015 15:42     ASSESSMENT / PLAN:  PULMONARY 6/15 OTT>> A: OSA on CPAP (noncompliant with this) Bilateral pleural effusion 6/15 Acute resp failure secondary to volume overload. Required intubation. P:   Vent bundle Suspect she will extubate quickly once volume is reduced.  CARDIOVASCULAR L i j hdCVL >>> A:  Bradycardia, improved (thought secondary to hyperkalemia) PAF on coumadin, rate controlled H/o HTN  P:  Telemetry monitoring Aggressive IVF resuscitation(stopped 6/15 due to overload) Hold PO antihypertensives (lisinopril, metoprolol, spironolactone) PRN metoprolol to keep SBP < 160    RENAL A:   Acute Kidney Injury, ddx includes dehydration, sepsis, medication related Hyperkalemia High AG metabolic acidosis, lactic elevated, metformin  Renal stones  P:   Volume overloaded, non responsive to lasix  Bmet  every 4 hours Correct electrolytes as indicated Nephrology consulted and will need HD 6/15 Bicarb gtt at kvo due to fluid overload, correct acidosis per HD  GASTROINTESTINAL A:   Suspected C. difficile colitis(neg pcr) Possible proctitis  Cholelitiasis(neg by [place])  P:   NPO for now SUP: IV protonix  HEMATOLOGIC Lab Results  Component Value Date   INR 1.77* 05/22/2015   INR 1.92* 05/21/2015   INR 5.30* 05/21/2015    A:   Coumadin coagulopathy(reveresed)  P:  Hold coumadin FFP x 4 Vit K Follow Coags  INFECTIOUS A:   Severe sepsis. Uncertain etiology at this time. ddx includes C. Dif colitis, or other intra-abdominal source Suspect C. Difficile colitis->neg c dif PCT 32 [place] abd 6/14 neg except gall stones P:   BCx2 6/13 >>> C. Dif PCR 6/13 >>>neg Abx: Vanc, start date 6/14 >>> Abx: Zosyn, start date 6/13 >> Abx: Flagyl, start date 6/14 >>> Abx: PO Vanc, start date  6/14 >>> Consider dc c dif tx 6/15  ENDOCRINE A:  DM Hypothyroidism tsh 1.285 Cortisol 25 P:   Dc metformin CBG monitoring and SSI Continue IV synthroid   NEUROLOGIC A:   Anxiety related to resp distress. Sedation for tube tolerance with RASS goal -1, will use intermittent  IV F/V, suspect she will need very little sedation.  P:   Sedated on vent PRN fet/Versed  FAMILY  - Updates: 6/15 Son called for impending intubation and he agreed.  - Inter-disciplinary family meet or Palliative Care meeting due by:  6/21  Richardson Landry Anusha Claus ACNP Maryanna Shape PCCM Pager [phone] till 3 pm If no answer page [phone] 05/22/2015, 7:46 AM

## 2015-05-22 NOTE — Progress Notes (Addendum)
Admit: 05/20/2015 LOS: 1  42F with normal baseline GFR admitted with AKI, Hyperkalemia, Inc AG Metabolic Acidosis in setting of N/V/D, recent azithromycin for URI, and use of ACEi, MRB, and metformin at home. Progressive hypoxic RF 6/15 and intubation  Subjective:  Progressive hypoxia, inc BPs overnight Failed BiPAP, intubated this AM Good UOP, rec lasix and unsure if responsive K WNL pCXR with edema Serum HCO3 improved    06/14 0701 - 06/15 0700 In: 5151.3 [I.V.:3896.3; Blood:589; IV Piggyback:666] Out: 1460 [RJJOA:4166]  Filed Weights   05/20/15 2311 05/21/15 0259 05/22/15 0500  Weight: 56.246 kg (124 lb) 61.9 kg (136 lb 7.4 oz) 67.8 kg (149 lb 7.6 oz)    Scheduled Meds: . sodium chloride   Intravenous Once  . antiseptic oral rinse  7 mL Mouth Rinse BID  . insulin aspart  0-9 Units Subcutaneous 6 times per day  . levothyroxine  37.5 mcg Intravenous Daily  . metronidazole  500 mg Intravenous Q8H  . pantoprazole (PROTONIX) IV  40 mg Intravenous Q12H  . piperacillin-tazobactam (ZOSYN)  IV  2.25 g Intravenous 3 times per day  . sodium chloride  1,000 mL Intravenous Once  . vancomycin  125 mg Oral 4 times per day  . vancomycin  750 mg Intravenous Q48H   Continuous Infusions: .  sodium bicarbonate  infusion 1000 mL 10 mL/hr at 05/22/15 0456   PRN Meds:.fentaNYL (SUBLIMAZE) injection, fentaNYL (SUBLIMAZE) injection, heparin, metoprolol, midazolam, midazolam, ondansetron **OR** ondansetron (ZOFRAN) IV  Current Labs: reviewed    Physical Exam:  Blood pressure 125/65, pulse 75, temperature 97.9 F (36.6 C), temperature source Oral, resp. rate 16, height 5\' 8"  (1.727 m), weight 67.8 kg (149 lb 7.6 oz), SpO2 100 %. GEN: intubated and sedated ENT: NCAT, ETT in place EYES: Eyese closed CV: RRR, no rub PULM: coarse bs b/l ABD: mild ttp throughout, no r/g, quiet SKIN: no rashes EXT:no LEE  A/P 1. AKI, nonoliguric 1. NOrmal baseline SCr 2. Suspect original presentation 2/2  Hypovolemia + ACEi + MRB +/- ATN 3. Has quickly gone from hypovolemia to hypervolemia, ? Valvular heart disease of CHF? 4. Will do iHD Today for volume removal 2. Hyperkalemia 1. 2/2 ACEi + MRB + #1; holding these medications 2. Improved 3. Inc AG Metabolic Acidosis 1. Likely from sepsis + metformin 2. Can't handle more volume, HD today 4. N/V/D + Hypovolemia prt critical care 5. ? Colits vs C diff colitis  1. On zosyn, flagyl, po vanc 6. HTN: holding BP meds 7. DM2: holding metformin  Pearson Grippe MD 05/22/2015, 8:25 AM   Recent Labs Lab 05/21/15 2100 05/22/15 0100 05/22/15 0420  NA 131* 131* 130*  K 5.1 4.9 4.7  CL 99* 98* 97*  CO2 15* 17* 17*  GLUCOSE 184* 175* 166*  BUN 62* 58* 59*  CREATININE 6.70* 6.61* 6.63*  CALCIUM 7.4* 7.4* 7.4*    Recent Labs Lab 05/20/15 2300  05/21/15 0420 05/21/15 0735 05/21/15 1126  WBC 12.7*  --  13.5* 12.9* 11.3*  NEUTROABS 8.9*  --   --   --   --   HGB 10.8*  < > 11.0* 10.8* 10.3*  HCT 30.9*  < > 32.2* 31.7* 29.9*  MCV 95.1  --  96.1 96.1 95.2  PLT 265  --  230 258 231  < > = values in this interval not displayed.

## 2015-05-22 NOTE — Progress Notes (Signed)
eLink Physician-Brief Progress Note Patient Name: [name] DOB: 1925/10/31 MRN: [id number]   Date of Service  05/22/2015  HPI/Events of Note  Patient with AKI and coagulopathy receiving 150 cc/hr of NS and 75 cc/hr bicarb gtt.  Had also received 4 units of FFP.  Now c/o of SOB with RR of 24 and sats of 94% but O2 requirements have increased.  Hypertensive and CVP of 22.  eICU Interventions  Plan: KVO IVFs Continue bicarb gtt for now Consider oral bicarb to decrease fluid load.     Intervention Category Intermediate Interventions: Respiratory distress - evaluation and management  DETERDING,ELIZABETH 05/22/2015, 1:11 AM

## 2015-05-22 NOTE — Progress Notes (Signed)
eLink Physician-Brief Progress Note Patient Name: [name] DOB: 1925-12-02 MRN: [id number]   Date of Service  05/22/2015  HPI/Events of Note  Hypotension. SBP = 75 and CVP = 10. Patient dialyzed today. Will avoid extra fluid. Now only on a Fentanyl IV infusion at 25 mcg/hour.  eICU Interventions  Will start on Phenylephrine IV infusion. Titrate to MAP >= 65.     Intervention Category Major Interventions: Hypotension - evaluation and management  Rishik Tubby Eugene 05/22/2015, 10:58 PM

## 2015-05-22 NOTE — Progress Notes (Addendum)
PULMONARY / CRITICAL CARE MEDICINE   Name: [name] MRN: [id number] DOB: 06/14/1925    ADMISSION DATE:  05/20/2015 CONSULTATION DATE:  05/21/2015  REFERRING MD :  Dr. Verlon Au  CHIEF COMPLAINT:  Abd pain  INITIAL PRESENTATION: 81 year old female who was admitted 6/13 with acute kidney injury with hyperkalemia and lactic acidosis. Also coagulopathic from coumadin. Limited improvement with ABX and volume. To ICU for PCCM management 6/14.  STUDIES:  CT abdomen 6/13 > Bilateral pleural effusions, moderate on the right and small on the left. Ascites. Distended gallbladder with multiple gallstones. Tiny bilateral renal stones. Slight prominence of the mucosa in the rectum and distal sigmoid.This could represent proctitis CT head 6/13 > No acute intracranial abnormality. Stable degree of cortical volume loss. 6/14 [place] abd unremarkable    SIGNIFICANT EVENTS: 6/13 > admitted for AKI with AMS and diarrhea 6/14 > Profound acidosis, transferred to ICU. PCCM assumed care.  6/15 intubated for acute resp distress after failing bipap.  HISTORY OF PRESENT ILLNESS:  81 y/o female P afib followed by Dr. Gwenlyn Found [prior Amiodarone, CHad2Vasc2 score~6, Hasbled score ? , Cardiac Cath 04/21/2007-No isch, severe OSA by sleep study 04/2011 [ non tolerant CPAP-last seen Dr. Gwenette Greet 2013], prior falls with Hip hematoma 12/13/11, bilateral non severe Cartoid stenosis foll by Dr. Trula Slade L>R based on 07/29/11 Carotid Duplex, NIDDM, HTN, prior syncope 04/2007. Has had prior diverticular bleed in the past and lasty colonoscopy was by ? Dr. Lajoyce Corners in 2001. Mrs. Shell recently had some productive cough and was started on Zpak for this 5 days PTA, after one day on the antibiotic she developed diarrhea with rectal bleeding which progressed to nausea and vomiting. Patient's son also noted that she may have been taking more of her medication than she was supposed to after doing his pill counts. She presented to ED 6/13 with these  complaints as well as some confusion. She was found to be coagulopathic as she had been taking extra coumadin. Her renal function was also markedly elevated from baseline with hyperkalemia. She had bradycardia which was thought secondary to that. Lactic acid found to be elevated. She was admitted for AKI, coagulopathy, and diarrhea. Of note she is also on metformin. Renal function and lactic with only minimal improvements since that time, PCCM consulted for further evaluation.      SUBJECTIVE:  Acute resp distress  VITAL SIGNS: Temp:  [96.7 F (35.9 C)-98.5 F (36.9 C)] 97.9 F (36.6 C) (06/15 0821) Pulse Rate:  [68-88] 74 (06/15 0950) Resp:  [16-31] 16 (06/15 0950) BP: (94-200)/(50-172) 170/82 mmHg (06/15 0950) SpO2:  [90 %-100 %] 99 % (06/15 0950) FiO2 (%):  [100 %] 100 % (06/15 0754) Weight:  [67.8 kg (149 lb 7.6 oz)] 67.8 kg (149 lb 7.6 oz) (06/15 0500) HEMODYNAMICS: CVP:  [12 mmHg-22 mmHg] 12 mmHg VENTILATOR SETTINGS: Vent Mode:  [-] PRVC FiO2 (%):  [100 %] 100 % Set Rate:  [16 bmp] 16 bmp Vt Set:  [500 mL] 500 mL PEEP:  [5 cmH20] 5 cmH20 Plateau Pressure:  [21 cmH20] 21 cmH20 INTAKE / OUTPUT:  Intake/Output Summary (Last 24 hours) at 05/22/15 1017 Last data filed at 05/22/15 0900  Gross per 24 hour  Intake 4871.34 ml  Output   2175 ml  Net 2696.34 ml    PHYSICAL EXAMINATION: General:  Elderly female in NAD, in obvious resp distress Neuro:  Anxious, confused, prior to intubation HEENT:  [place]/AT, PERRL, no JVD noted Cardiovascular:  RRR, no MRG Lungs:  Bilateral basilar crackles prior to intubation Abdomen:  Soft, mildly tender, normal percussion.  Musculoskeletal:  No acute deformity or ROM limitation Skin:  No edema, grossly intact.   LABS:  CBC  Recent Labs Lab 05/21/15 0420 05/21/15 0735 05/21/15 1126  WBC 13.5* 12.9* 11.3*  HGB 11.0* 10.8* 10.3*  HCT 32.2* 31.7* 29.9*  PLT 230 258 231   Coag's  Recent Labs Lab 05/21/15 0735 05/21/15 1629  05/22/15 0420  APTT 41*  --   --   INR 5.30* 1.92* 1.77*   BMET  Recent Labs Lab 05/21/15 2100 05/22/15 0100 05/22/15 0420  NA 131* 131* 130*  K 5.1 4.9 4.7  CL 99* 98* 97*  CO2 15* 17* 17*  BUN 62* 58* 59*  CREATININE 6.70* 6.61* 6.63*  GLUCOSE 184* 175* 166*   Electrolytes  Recent Labs Lab 05/21/15 2100 05/22/15 0100 05/22/15 0420  CALCIUM 7.4* 7.4* 7.4*   Sepsis Markers  Recent Labs Lab 05/21/15 0420 05/21/15 0735 05/21/15 0948 05/22/15 0845  LATICACIDVEN 7.7* 7.5* 7.2* 3.2*  PROCALCITON 32.08  --   --   --    ABG  Recent Labs Lab 05/21/15 0755 05/22/15 0450 05/22/15 0830  PHART 7.172* 7.272* 7.354  PCO2ART 19.7* 33.2* 30.0*  PO2ART 87.7 65.0* 114*   Liver Enzymes  Recent Labs Lab 05/21/15 0420  AST 349*  ALT 192*  ALKPHOS 324*  BILITOT 0.7  ALBUMIN 3.0*   Cardiac Enzymes  Recent Labs Lab 05/20/15 2300 05/21/15 0420  TROPONINI 0.03 0.03   Glucose  Recent Labs Lab 05/21/15 1116 05/21/15 1201 05/21/15 1543 05/21/15 2033 05/22/15 0040 05/22/15 0400  GLUCAP 106* 100* 146* 153* 161* 163*    Imaging US Abdomen Complete  05/21/2015   CLINICAL DATA:  Abdominal pain.  Gallstones.  EXAM: ULTRASOUND ABDOMEN COMPLETE  COMPARISON:  CT abdomen and pelvis earlier this same day.  FINDINGS: Gallbladder: Stones are identified within the gallbladder measuring up to 1.5 cm. The gallbladder wall is thickened at 0.7 cm. Sonographer reports negative Murphy's sign.  Common bile duct: Diameter: 0.3 cm.  Liver: No focal lesion identified. Within normal limits in parenchymal echogenicity.  IVC: No abnormality visualized.  Pancreas: Visualized portion unremarkable.  Spleen: Size and appearance within normal limits.  Right Kidney: Length: 10.9 cm. Echogenicity within normal limits. No hydronephrosis visualized. A 0.8 cm hyperechoic lesion in the lower pole of the left kidney cannot be definitively characterized but likely represents a complex cyst.  Left  Kidney: Length: 10.9 cm. Echogenicity within normal limits. No mass or hydronephrosis visualized.  Abdominal aorta: No aneurysm visualized.  Other findings: There is a small volume of abdominal ascites. Bilateral pleural effusions are seen, greater on the right.  IMPRESSION: Gallstones without evidence of cholecystitis. Gallbladder wall thickening is likely related to small volume of abdominal ascites.  Bilateral pleural effusions.   Electronically Signed   By: Inge Rise M.D.   On: 05/21/2015 14:04   Dg Chest Port 1 View  05/22/2015   CLINICAL DATA:  Hypoxia  EXAM: PORTABLE CHEST - 1 VIEW  COMPARISON:  Study obtained earlier in the day.  FINDINGS: Endotracheal tube tip is 3.5 cm above the carina. Nasogastric tube tip and side port are below the diaphragm. Central catheter tip is in the superior cava. No pneumothorax. There is interstitial edema with bilateral effusions. Heart is mildly enlarged with pulmonary venous hypertension. No bone lesions.  IMPRESSION: Tube and catheter positions as described without pneumothorax. Congestive heart failure. New small left effusion compared  to earlier in the day. The degree of edema and right effusion remain stable.   Electronically Signed   By: Lowella Grip III M.D.   On: 05/22/2015 08:32   Dg Chest Port 1 View  05/22/2015   CLINICAL DATA:  Decreased oxygen saturation and shortness of breath  EXAM: PORTABLE CHEST - 1 VIEW  COMPARISON:  May 21, 2015  FINDINGS: Central catheter tip is in the superior cava. No pneumothorax. There is increase in interstitial edema. There is a new right pleural effusion. Heart is mildly enlarged with mild pulmonary venous hypertension. There is atelectatic change superimposed on edema in the left base. No adenopathy.  IMPRESSION: Congestive heart failure with new right effusion increased edema compared to 1 day prior.   Electronically Signed   By: Lowella Grip III M.D.   On: 05/22/2015 07:26   Dg Chest Port 1  View  05/21/2015   CLINICAL DATA:  Followup acute renal failure  EXAM: PORTABLE CHEST - 1 VIEW  COMPARISON:  05/20/2015  FINDINGS: Right IJ central line, tip at the SVC level.  No pneumothorax.  New diffuse interstitial opacity, compatible with edema. Normal heart size and stable aortic tortuosity, accentuated by rotation. Hyperinflation which is stable from previous. No emphysematous changes on 04/25/2014 chest CT.  IMPRESSION: 1. New right IJ central line.  No pneumothorax. 2. New mild pulmonary edema.   Electronically Signed   By: Monte Fantasia M.D.   On: 05/21/2015 15:42   Dg Abd Portable 1v  05/22/2015   CLINICAL DATA:  Nasogastric tube placement  EXAM: PORTABLE ABDOMEN - 1 VIEW  COMPARISON:  CT abdomen and pelvis May 21, 2015  FINDINGS: Nasogastric tube tip and side port are within the stomach. There is no bowel dilatation or air-fluid level suggesting obstruction. No free air is seen on this supine examination. There are phleboliths in the pelvis.  IMPRESSION: Nasogastric tube tip and side port in stomach. Bowel gas pattern unremarkable.   Electronically Signed   By: Lowella Grip III M.D.   On: 05/22/2015 08:33     ASSESSMENT / PLAN:  PULMONARY 6/15 OTT>> A: OSA on CPAP (noncompliant with this) Bilateral pleural effusion 6/15 Acute resp failure secondary to volume overload. Required intubation. P:   Vent bundle Suspect she will extubate quickly once volume is reduced.  CARDIOVASCULAR L i j hdCVL >>> A:  Bradycardia, improved (thought secondary to hyperkalemia) PAF on coumadin, rate controlled H/o HTN  P:  Telemetry monitoring Aggressive IVF resuscitation(stopped 6/15 due to overload) Hold PO antihypertensives (lisinopril, metoprolol, spironolactone) PRN metoprolol to keep SBP < 160    RENAL A:   Acute Kidney Injury, ddx includes dehydration, sepsis, medication related Hyperkalemia High AG metabolic acidosis, lactic elevated, metformin Renal stones  P:    Volume overloaded, non responsive to lasix  Bmet  every 4 hours Correct electrolytes as indicated Nephrology consulted and will need HD 6/15 Bicarb gtt at kvo due to fluid overload, correct acidosis per HD  GASTROINTESTINAL A:   Suspected C. difficile colitis(neg pcr) Possible proctitis  Cholelitiasis(neg by [place])  P:   NPO for now SUP: IV protonix  HEMATOLOGIC Lab Results  Component Value Date   INR 1.77* 05/22/2015   INR 1.92* 05/21/2015   INR 5.30* 05/21/2015    A:   Coumadin coagulopathy(reveresed)  P:  Hold coumadin FFP x 4 Vit K Follow Coags  INFECTIOUS A:   Severe sepsis. Uncertain etiology at this time. ddx includes C. Dif colitis, or other intra-abdominal  source Suspect C. Difficile colitis->neg c dif PCT 32 [place] abd 6/14 neg except gall stones P:   BCx2 6/13 >>> C. Dif PCR 6/13 >>>neg Abx: Vanc, start date 6/14 >>> Abx: Zosyn, start date 6/13 >> Abx: Flagyl, start date 6/14 >>> Abx: PO Vanc, start date 6/14 >>> Consider dc c dif tx 6/15  ENDOCRINE A:  DM Hypothyroidism tsh 1.285 Cortisol 25 P:   Dc metformin CBG monitoring and SSI Continue IV synthroid   NEUROLOGIC A:   Anxiety related to resp distress. Sedation for tube tolerance with RASS goal -1, will use intermittent  IV F/V, suspect she will need very little sedation.  P:   Sedated on vent PRN fet/Versed  FAMILY  - Updates: 6/15 Son called for impending intubation and he agreed.  - Inter-disciplinary family meet or Palliative Care meeting due by:  6/21  Richardson Landry Minor ACNP Maryanna Shape PCCM Pager [phone] till 3 pm If no answer page [phone] 05/22/2015, 10:17 AM   PCCM ATTENDING: I have reviewed pt's initial presentation, consultants notes and hospital database in detail.  The above assessment and plan was formulated under my direction.  In summary: Advanced age Chronic warfarin therapy Recent LGIB Volume overload after resuscitation Pulm edema, pleural effusions Vent  dependence AKI Hyperkalemia, resolved   PLAN: Cont vent support - settings reviewed and/or adjusted Cont vent bundle Daily SBT if/when meets criteria  Hopefully can be extubated 6/16 Monitor BMET intermittently Monitor I/Os Correct electrolytes as indicated HD planned for 6/15 Warfarin has been reversed  Probably not a candidate for any further anticoagulation DVT px: SQ heparin Monitor CBC intermittently Transfuse per usual guidelines Needs goals of care discussed and clarified prior to extubation    40 minutes of independent CCM time was provided by me   Merton Border, MD;  PCCM service; Mobile [phone]

## 2015-05-22 NOTE — Progress Notes (Signed)
[facility] Progress Note Patient Name: [name] DOB: Sep 03, 1925 MRN: [id number]   Date of Service  05/22/2015  HPI/Events of Note  Patient agitated. Trying to get to ETT tube.   eICU Interventions  Will order: 1. Bilateral wrist restraints 2. Start a Fentanyl IV infusion. 3. Wean Precedex IV infusion when Fentanyl IV infusion is up and running.      Intervention Category Minor Interventions: Agitation / anxiety - evaluation and management  Halana Deisher Eugene 05/22/2015, 8:25 PM

## 2015-05-22 NOTE — Progress Notes (Signed)
eLink Physician-Brief Progress Note Patient Name: [name] DOB: [birth year] MRN: [id number]   Date of Service  05/22/2015  HPI/Events of Note  Call from nurse reporting worsening resp distress in patient with AKI.  Is making urine but intake>>output.  RR is 24 with sats in the low 90s.  Sitting on side of bed with increased WOB  eICU Interventions  Plan: KVO all IVFs 160 mg lasix IV times one dose now. To be evaluated at bedside     Intervention Category Intermediate Interventions: Respiratory distress - evaluation and management  DETERDING,ELIZABETH 05/22/2015, 4:57 AM

## 2015-05-22 NOTE — Procedures (Signed)
Intubation Procedure Note [name] [id number] 04-06-1925  Procedure: Intubation Indications: Respiratory insufficiency  Procedure Details Consent: Unable to obtain consent because of emergent medical necessity. Time Out: Verified patient identification, verified procedure, site/side was marked, verified correct patient position, special equipment/implants available, medications/allergies/relevent history reviewed, required imaging and test results available.  Performed  Maximum sterile technique was used including gloves, hand hygiene and mask.  MAC    Evaluation Hemodynamic Status: BP stable throughout; O2 sats: stable throughout Patient's Current Condition: stable Complications: No apparent complications Patient did tolerate procedure well. Chest X-ray ordered to verify placement.  CXR: pending.   [name] 05/22/2015

## 2015-05-22 NOTE — Progress Notes (Signed)
CRITICAL VALUE ALERT  Critical value received:  Lactic Acid 3.2  Date of notification:  05/22/2015   Time of notification:  9:33 AM  Critical value read back:Yes.    Nurse who received alert:  Nettie Elm, RN  MD notified (1st page):  Dr. Alva Garnet  Time of first page:  9:34 AM   MD notified (2nd page):  Time of second page:  Responding MD:  Dr. Alva Garnet  Time MD responded:  9:34 AM

## 2015-05-22 NOTE — Progress Notes (Signed)
PHARMACY NOTE  Pharmacy Consult: Vancomycin Indication: Severe Sepsis  Vancomycin Dosing Weight: 64 kg  Afebrile  97.4 F (36.3 C) (Oral)    Labs:  Recent Labs  05/21/15 0420 05/21/15 0722 05/21/15 0735 05/21/15 1126 05/21/15 2100 05/22/15 0100 05/22/15 0420  WBC 13.5*  --  12.9* 11.3*  --   --   --   HGB 11.0*  --  10.8* 10.3*  --   --   --   PLT 230  --  258 231  --   --   --   LABCREA  --  48.30  --   --   --   --   --   CREATININE 7.41*  --   --   --  6.70* 6.61* 6.63*   Estimated Creatinine Clearance: 6 mL/min (by C-G formula based on Cr of 6.63).  No results for input(s): VANCOTROUGH in the last 72 hours.   Microbiology: Recent Results (from the past 720 hour(s))  MRSA PCR Screening     Status: None   Collection Time: 05/21/15  3:00 AM  Result Value Ref Range Status   MRSA by PCR NEGATIVE NEGATIVE Final    Comment:        The GeneXpert MRSA Assay (FDA approved for NASAL specimens only), is one component of a comprehensive MRSA colonization surveillance program. It is not intended to diagnose MRSA infection nor to guide or monitor treatment for MRSA infections.   Culture, blood (x 2)     Status: None (Preliminary result)   Collection Time: 05/21/15  7:35 AM  Result Value Ref Range Status   Specimen Description BLOOD LEFT HAND  Final   Special Requests BOTTLES DRAWN AEROBIC AND ANAEROBIC 4 CC EACH  Final   Culture   Final           BLOOD CULTURE RECEIVED NO GROWTH TO DATE CULTURE WILL BE HELD FOR 5 DAYS BEFORE ISSUING A FINAL NEGATIVE REPORT Performed at [facility]    Report Status PENDING  Incomplete  Culture, blood (x 2)     Status: None (Preliminary result)   Collection Time: 05/21/15  7:38 AM  Result Value Ref Range Status   Specimen Description BLOOD RIGHT HAND  Final   Special Requests BOTTLES DRAWN AEROBIC ONLY 5CC  Final   Culture   Final           BLOOD CULTURE RECEIVED NO GROWTH TO DATE CULTURE WILL BE HELD  FOR 5 DAYS BEFORE ISSUING A FINAL NEGATIVE REPORT Performed at [facility]    Report Status PENDING  Incomplete  Clostridium Difficile by PCR (not at [facility])     Status: None   Collection Time: 05/21/15 12:00 PM  Result Value Ref Range Status   C difficile by pcr NEGATIVE NEGATIVE Final    MEDICATION: Scheduled:  Scheduled:  . sodium chloride   Intravenous Once  . antiseptic oral rinse  7 mL Mouth Rinse QID  . chlorhexidine  15 mL Mouth Rinse BID  . insulin aspart  0-9 Units Subcutaneous 6 times per day  . levothyroxine  37.5 mcg Intravenous Daily  . pantoprazole (PROTONIX) IV  40 mg Intravenous Q12H  . piperacillin-tazobactam (ZOSYN)  IV  2.25 g Intravenous 3 times per day  . sodium chloride  1,000 mL Intravenous Once  . vancomycin (VANCOCIN) 750 mg IVPB  750 mg Intravenous Once   Infusion[s]: Infusions:  . dexmedetomidine Stopped (05/22/15 2110)  . fentaNYL infusion INTRAVENOUS 25 mcg/hr (05/22/15 2109)  Antibiotic[s]: Anti-infectives    Start     Dose/Rate Route Frequency Ordered Stop   05/22/15 2115  vancomycin (VANCOCIN) 750 mg in sodium chloride 0.9 % 150 mL IVPB     750 mg 150 mL/hr over 60 Minutes Intravenous  Once 05/22/15 2113     05/21/15 1430  vancomycin (VANCOCIN) IVPB 750 mg/150 ml premix  Status:  Discontinued     750 mg 150 mL/hr over 60 Minutes Intravenous Every 48 hours 05/21/15 1425 05/22/15 0959   05/21/15 1200  vancomycin (VANCOCIN) 50 mg/mL oral solution 125 mg  Status:  Discontinued     125 mg Oral 4 times per day 05/21/15 1048 05/22/15 1031   05/21/15 1100  metroNIDAZOLE (FLAGYL) IVPB 500 mg  Status:  Discontinued     500 mg 100 mL/hr over 60 Minutes Intravenous Every 8 hours 05/21/15 1048 05/22/15 1031   05/21/15 0545  vancomycin (VANCOCIN) IVPB 750 mg/150 ml premix  Status:  Discontinued     750 mg 150 mL/hr over 60 Minutes Intravenous Every 48 hours 05/21/15 0543 05/21/15 0728   05/21/15 0545  piperacillin-tazobactam (ZOSYN) IVPB 2.25 g      2.25 g 100 mL/hr over 30 Minutes Intravenous 3 times per day 05/21/15 0543     05/21/15 0430  doxycycline (VIBRAMYCIN) 100 mg in dextrose 5 % 250 mL IVPB  Status:  Discontinued     100 mg 125 mL/hr over 120 Minutes Intravenous 2 times daily 05/21/15 0335 05/21/15 0523     Assessment:  81 y/o female with Severe Sepsis on Day # 2 of Vancomycin/Zosyn.  S/p HD session today.  Vancomycin dose will be given as replacement.  Goal of Therapy:   Pre-Dialysis level 15 - 25 mcg/ml  Zosyn dosed for clinical indication and adjusted for renal function.  Plan: 1. Continue Zosyn as ordered, 2.25 gm IV q 8 hours.  2. Vancomycin 750 mg IV x 1. 3. Follow up cultures, clinical course, HD schedule, Vancomycin levels as clinically indicated, and antibiotics adjusted as indicated.  Marthenia Rolling,  Pharm.D   05/22/2015,  9:19 PM

## 2015-05-23 ENCOUNTER — Inpatient Hospital Stay (HOSPITAL_COMMUNITY): Payer: Medicare Other

## 2015-05-23 DIAGNOSIS — G4733 Obstructive sleep apnea (adult) (pediatric): Secondary | ICD-10-CM

## 2015-05-23 LAB — COMPREHENSIVE METABOLIC PANEL
ALBUMIN: 3.2 g/dL — AB (ref 3.5–5.0)
ALT: 141 U/L — ABNORMAL HIGH (ref 14–54)
ANION GAP: 18 — AB (ref 5–15)
AST: 121 U/L — ABNORMAL HIGH (ref 15–41)
Alkaline Phosphatase: 207 U/L — ABNORMAL HIGH (ref 38–126)
BUN: 33 mg/dL — AB (ref 6–20)
CHLORIDE: 95 mmol/L — AB (ref 101–111)
CO2: 23 mmol/L (ref 22–32)
Calcium: 8 mg/dL — ABNORMAL LOW (ref 8.9–10.3)
Creatinine, Ser: 4.28 mg/dL — ABNORMAL HIGH (ref 0.44–1.00)
GFR calc Af Amer: 10 mL/min — ABNORMAL LOW (ref >60)
GFR calc non Af Amer: 8 mL/min — ABNORMAL LOW (ref >60)
Glucose, Bld: 146 mg/dL — ABNORMAL HIGH (ref 65–99)
Potassium: 3.7 mmol/L (ref 3.5–5.1)
Sodium: 136 mmol/L (ref 135–145)
TOTAL PROTEIN: 6.4 g/dL — AB (ref 6.5–8.1)
Total Bilirubin: 0.9 mg/dL (ref 0.3–1.2)

## 2015-05-23 LAB — HEPATITIS B SURFACE ANTIBODY,QUALITATIVE: Hep B S Ab: NONREACTIVE

## 2015-05-23 LAB — CBC
HEMATOCRIT: 29.8 % — AB (ref 36.0–46.0)
Hemoglobin: 10.6 g/dL — ABNORMAL LOW (ref 12.0–15.0)
MCH: 32.6 pg (ref 26.0–34.0)
MCHC: 35.6 g/dL (ref 30.0–36.0)
MCV: 91.7 fL (ref 78.0–100.0)
Platelets: 243 10*3/uL (ref 150–400)
RBC: 3.25 MIL/uL — AB (ref 3.87–5.11)
RDW: 12.8 % (ref 11.5–15.5)
WBC: 11.4 10*3/uL — AB (ref 4.0–10.5)

## 2015-05-23 LAB — HEPATITIS B SURFACE ANTIGEN: Hepatitis B Surface Ag: NEGATIVE

## 2015-05-23 LAB — GLUCOSE, CAPILLARY
GLUCOSE-CAPILLARY: 142 mg/dL — AB (ref 65–99)
Glucose-Capillary: 156 mg/dL — ABNORMAL HIGH (ref 65–99)
Glucose-Capillary: 137 mg/dL — ABNORMAL HIGH (ref 65–99)
Glucose-Capillary: 118 mg/dL — ABNORMAL HIGH (ref 65–99)
Glucose-Capillary: 142 mg/dL — ABNORMAL HIGH (ref 65–99)
Glucose-Capillary: 176 mg/dL — ABNORMAL HIGH (ref 65–99)
Glucose-Capillary: 125 mg/dL — ABNORMAL HIGH (ref 65–99)

## 2015-05-23 LAB — APTT: APTT: 32 s (ref 24–37)

## 2015-05-23 LAB — PHOSPHORUS: Phosphorus: 4.8 mg/dL — ABNORMAL HIGH (ref 2.5–4.6)

## 2015-05-23 LAB — PROCALCITONIN: Procalcitonin: 10.68 ng/mL

## 2015-05-23 LAB — MAGNESIUM: Magnesium: 1.6 mg/dL — ABNORMAL LOW (ref 1.7–2.4)

## 2015-05-23 LAB — HEPATITIS B CORE ANTIBODY, TOTAL: HEP B C TOTAL AB: NEGATIVE

## 2015-05-23 LAB — PROTIME-INR
INR: 1.51 — AB (ref 0.00–1.49)
PROTHROMBIN TIME: 18.3 s — AB (ref 11.6–15.2)

## 2015-05-23 MED ORDER — AMIODARONE HCL IN DEXTROSE 360-4.14 MG/200ML-% IV SOLN
60.0000 mg/h | INTRAVENOUS | Status: AC
  Administered 2015-05-23 (×2): 60 mg/h via INTRAVENOUS
  Filled 2015-05-23 (×2): qty 200

## 2015-05-23 MED ORDER — AMIODARONE HCL IN DEXTROSE 360-4.14 MG/200ML-% IV SOLN
30.0000 mg/h | INTRAVENOUS | Status: DC
  Administered 2015-05-23 – 2015-05-24 (×2): 30 mg/h via INTRAVENOUS
  Filled 2015-05-23 (×5): qty 200

## 2015-05-23 MED ORDER — MAGNESIUM SULFATE 50 % IJ SOLN
2.0000 g | Freq: Once | INTRAMUSCULAR | Status: DC
  Filled 2015-05-23: qty 4

## 2015-05-23 MED ORDER — MAGNESIUM SULFATE 2 GM/50ML IV SOLN: 2.0000 g | Freq: Once | INTRAVENOUS | Status: DC

## 2015-05-23 MED ORDER — CEFTRIAXONE SODIUM IN DEXTROSE 20 MG/ML IV SOLN
1.0000 g | INTRAVENOUS | Status: DC
  Administered 2015-05-23 – 2015-05-27 (×5): 1 g via INTRAVENOUS
  Filled 2015-05-23 (×5): qty 50

## 2015-05-23 MED ORDER — AMIODARONE LOAD VIA INFUSION
150.0000 mg | Freq: Once | INTRAVENOUS | Status: AC
  Administered 2015-05-23: 150 mg via INTRAVENOUS
  Filled 2015-05-23: qty 83.34

## 2015-05-23 MED ORDER — MAGNESIUM SULFATE 2 GM/50ML IV SOLN
2.0000 g | Freq: Once | INTRAVENOUS | Status: AC
  Administered 2015-05-23: 2 g via INTRAVENOUS

## 2015-05-23 NOTE — Progress Notes (Signed)
Admit: 05/20/2015 LOS: 2  31F with normal baseline GFR admitted with AKI, Hyperkalemia, Inc AG Metabolic Acidosis in setting of N/V/D, recent azithromycin for URI, and use of ACEi, MRB, and metformin at home. Progressive hypoxic RF 6/15 and intubation  Subjective:  HD yesterday, 2.5L UF, also 2L UOP  FIO2 to 40%, awake this AM AFib with RVR, mild hypotension    06/15 0701 - 06/16 0700 In: 910.2 [I.V.:520.2; NG/GT:40; IV Piggyback:350] Out: 4485 [MWNUU:7253]  Filed Weights   05/22/15 1528 05/22/15 1828 05/23/15 0417  Weight: 67.4 kg (148 lb 9.4 oz) 64.6 kg (142 lb 6.7 oz) 63.5 kg (139 lb 15.9 oz)    Scheduled Meds: . sodium chloride   Intravenous Once  . antiseptic oral rinse  7 mL Mouth Rinse QID  . chlorhexidine  15 mL Mouth Rinse BID  . insulin aspart  0-9 Units Subcutaneous 6 times per day  . levothyroxine  37.5 mcg Intravenous Daily  . pantoprazole (PROTONIX) IV  40 mg Intravenous Q12H  . piperacillin-tazobactam (ZOSYN)  IV  2.25 g Intravenous 3 times per day  . sodium chloride  1,000 mL Intravenous Once   Continuous Infusions: . dexmedetomidine Stopped (05/22/15 2110)  . fentaNYL infusion INTRAVENOUS Stopped (05/23/15 0740)  . phenylephrine (NEO-SYNEPHRINE) Adult infusion Stopped (05/23/15 0740)   PRN Meds:.sodium chloride, sodium chloride, feeding supplement (NEPRO CARB STEADY), fentaNYL (SUBLIMAZE) injection, heparin, heparin, midazolam, midazolam, ondansetron **OR** ondansetron (ZOFRAN) IV  Current Labs: reviewed    Physical Exam:  Blood pressure 94/58, pulse 125, temperature 97.9 F (36.6 C), temperature source Oral, resp. rate 13, height 5\' 8"  (1.727 m), weight 63.5 kg (139 lb 15.9 oz), SpO2 100 %. GEN: intubated and sedated ENT: NCAT, ETT in place EYES: Eyese closed CV: RRR, no rub PULM: coarse bs b/l ABD: mild ttp throughout, no r/g, quiet SKIN: no rashes EXT:no LEE  A/P 1. AKI, nonoliguric 1. Normal baseline SCr 2. Suspect original presentation 2/2  Hypovolemia + ACEi + MRB +/- ATN 3. Has quickly gone from hypovolemia to hypervolemia, ? Valvular heart disease of CHF? 4. S/p iHD 6/15 5. Hold on further HD currently, good UOP, improved resp status 6. Probably would respond to diuretics as needed 2. Hyperkalemia 1. 2/2 ACEi + MRB + #1; holding these medications 2. Resolved 3. Inc AG Metabolic Acidosis 1. Likely from sepsis + metformin 2. Resolved with HD, holding medications, bicarb replacement 4. ? Colits vs C diff colitis; c diff negative 1. On zosyn 5. HTN: holding BP meds 6. DM2: holding metformin  Pearson Grippe MD 05/23/2015, 8:22 AM   Recent Labs Lab 05/22/15 0100 05/22/15 0420 05/23/15 0420  NA 131* 130* 136  K 4.9 4.7 3.7  CL 98* 97* 95*  CO2 17* 17* 23  GLUCOSE 175* 166* 146*  BUN 58* 59* 33*  CREATININE 6.61* 6.63* 4.28*  CALCIUM 7.4* 7.4* 8.0*  PHOS  --   --  4.8*    Recent Labs Lab 05/20/15 2300  05/21/15 0735 05/21/15 1126 05/23/15 0420  WBC 12.7*  < > 12.9* 11.3* 11.4*  NEUTROABS 8.9*  --   --   --   --   HGB 10.8*  < > 10.8* 10.3* 10.6*  HCT 30.9*  < > 31.7* 29.9* 29.8*  MCV 95.1  < > 96.1 95.2 91.7  PLT 265  < > 258 231 243  < > = values in this interval not displayed.

## 2015-05-23 NOTE — Progress Notes (Signed)
eLink Physician-Brief Progress Note Patient Name: [name] DOB: [birth year] MRN: [id number]   Date of Service  05/23/2015  HPI/Events of Note  Low mg  eICU Interventions  mg     Intervention Category Major Interventions: Electrolyte abnormality - evaluation and management  Raylene Miyamoto. 05/23/2015, 6:10 AM

## 2015-05-23 NOTE — Progress Notes (Signed)
Pt stated she does not sleep with cpap and does not wish to wear tonight. RT informed pt if she changes her mind to call for RT.

## 2015-05-23 NOTE — Progress Notes (Signed)
PULMONARY / CRITICAL CARE MEDICINE   Name: [name] MRN: [id number] DOB: 05-11-1925    ADMISSION DATE:  05/20/2015 CONSULTATION DATE:  05/21/2015  REFERRING MD :  Dr. Verlon Au  CHIEF COMPLAINT:  Abd pain  INITIAL PRESENTATION: 81 year old female who was admitted 6/13 with acute kidney injury with hyperkalemia and lactic acidosis. Also coagulopathic from coumadin. Limited improvement with ABX and volume. To ICU for PCCM management 6/14.  STUDIES:  CT abdomen 6/13 > Bilateral pleural effusions, moderate on the right and small on the left. Ascites. Distended gallbladder with multiple gallstones. Tiny bilateral renal stones. Slight prominence of the mucosa in the rectum and distal sigmoid.This could represent proctitis CT head 6/13 > No acute intracranial abnormality. Stable degree of cortical volume loss. 6/14 [place] abd unremarkable    SIGNIFICANT EVENTS: 6/13 > admitted for AKI with AMS and diarrhea 6/14 > Profound acidosis, transferred to ICU. PCCM assumed care.  6/15 intubated for acute resp distress after failing bipap.  HISTORY OF PRESENT ILLNESS:  81 y/o female P afib followed by Dr. Gwenlyn Found [prior Amiodarone, CHad2Vasc2 score~6, Hasbled score ? , Cardiac Cath 04/21/2007-No isch, severe OSA by sleep study 04/2011 [ non tolerant CPAP-last seen Dr. Gwenette Greet 2013], prior falls with Hip hematoma 12/13/11, bilateral non severe Cartoid stenosis foll by Dr. Trula Slade L>R based on 07/29/11 Carotid Duplex, NIDDM, HTN, prior syncope 04/2007. Has had prior diverticular bleed in the past and lasty colonoscopy was by ? Dr. Lajoyce Corners in 2001. Mrs. Minchew recently had some productive cough and was started on Zpak for this 5 days PTA, after one day on the antibiotic she developed diarrhea with rectal bleeding which progressed to nausea and vomiting. Patient's son also noted that she may have been taking more of her medication than she was supposed to after doing his pill counts. She presented to ED 6/13 with these  complaints as well as some confusion. She was found to be coagulopathic as she had been taking extra coumadin. Her renal function was also markedly elevated from baseline with hyperkalemia. She had bradycardia which was thought secondary to that. Lactic acid found to be elevated. She was admitted for AKI, coagulopathy, and diarrhea. Of note she is also on metformin. Renal function and lactic with only minimal improvements since that time, PCCM consulted for further evaluation.      SUBJECTIVE:  Acute resp distress  VITAL SIGNS: Temp:  [95 F (35 C)-97.9 F (36.6 C)] 97.9 F (36.6 C) (06/16 0805) Pulse Rate:  [25-129] 110 (06/16 0945) Resp:  [11-23] 12 (06/16 0945) BP: (60-160)/(41-123) 105/66 mmHg (06/16 0945) SpO2:  [97 %-100 %] 100 % (06/16 0945) FiO2 (%):  [40 %-80 %] 40 % (06/16 0900) Weight:  [63.5 kg (139 lb 15.9 oz)-67.4 kg (148 lb 9.4 oz)] 63.5 kg (139 lb 15.9 oz) (06/16 0417) HEMODYNAMICS: CVP:  [10 mmHg-18 mmHg] 16 mmHg VENTILATOR SETTINGS: Vent Mode:  [-] PRVC FiO2 (%):  [40 %-80 %] 40 % Set Rate:  [14 bmp] 14 bmp Vt Set:  [430 mL-450 mL] 450 mL PEEP:  [5 cmH20] 5 cmH20 Plateau Pressure:  [13 cmH20-22 cmH20] 13 cmH20 INTAKE / OUTPUT:  Intake/Output Summary (Last 24 hours) at 05/23/15 1040 Last data filed at 05/23/15 0900  Gross per 24 hour  Intake 941.74 ml  Output   3885 ml  Net -2943.26 ml    PHYSICAL EXAMINATION: General:  Elderly female in NAD, in obvious resp distress Neuro:  Anxious, confused, prior to intubation HEENT:  [place]/AT, PERRL,  no JVD noted Cardiovascular:  RRR, no MRG Lungs:  Bilateral basilar crackles prior to intubation Abdomen:  Soft, mildly tender, normal percussion.  Musculoskeletal:  No acute deformity or ROM limitation Skin:  No edema, grossly intact.   LABS:  CBC  Recent Labs Lab 05/21/15 0735 05/21/15 1126 05/23/15 0420  WBC 12.9* 11.3* 11.4*  HGB 10.8* 10.3* 10.6*  HCT 31.7* 29.9* 29.8*  PLT 258 231 243    Coag's  Recent Labs Lab 05/21/15 0735 05/21/15 1629 05/22/15 0420 05/23/15 0420  APTT 41*  --   --  32  INR 5.30* 1.92* 1.77* 1.51*   BMET  Recent Labs Lab 05/22/15 0100 05/22/15 0420 05/23/15 0420  NA 131* 130* 136  K 4.9 4.7 3.7  CL 98* 97* 95*  CO2 17* 17* 23  BUN 58* 59* 33*  CREATININE 6.61* 6.63* 4.28*  GLUCOSE 175* 166* 146*   Electrolytes  Recent Labs Lab 05/22/15 0100 05/22/15 0420 05/23/15 0420  CALCIUM 7.4* 7.4* 8.0*  MG  --   --  1.6*  PHOS  --   --  4.8*   Sepsis Markers  Recent Labs Lab 05/21/15 0420 05/21/15 0735 05/21/15 0948 05/22/15 0845 05/23/15 0420  LATICACIDVEN 7.7* 7.5* 7.2* 3.2*  --   PROCALCITON 32.08  --   --   --  10.68   ABG  Recent Labs Lab 05/21/15 0755 05/22/15 0450 05/22/15 0830  PHART 7.172* 7.272* 7.354  PCO2ART 19.7* 33.2* 30.0*  PO2ART 87.7 65.0* 114*   Liver Enzymes  Recent Labs Lab 05/21/15 0420 05/23/15 0420  AST 349* 121*  ALT 192* 141*  ALKPHOS 324* 207*  BILITOT 0.7 0.9  ALBUMIN 3.0* 3.2*   Cardiac Enzymes  Recent Labs Lab 05/20/15 2300 05/21/15 0420  TROPONINI 0.03 0.03   Glucose  Recent Labs Lab 05/22/15 1144 05/22/15 1547 05/22/15 2014 05/23/15 0026 05/23/15 0350 05/23/15 0803  GLUCAP 102* 96 94 156* 137* 118*   Imaging Dg Chest Port 1 View  05/23/2015   CLINICAL DATA:  Respiratory failure.  EXAM: PORTABLE CHEST - 1 VIEW  COMPARISON:  05/22/2015.  FINDINGS: Endotracheal tube, NG tube, right IJ line in stable position. Mediastinum hilar structures are stable. Heart size stable. Bilateral pulmonary infiltrates again noted. Slight interim improvement. Persistent small right pleural effusion. No pneumothorax.  IMPRESSION: 1. Lines and tubes in stable position. 2. Bilateral pulmonary infiltrates remain. There has been slight improvement. Persistent right pleural effusion.   Electronically Signed   By: Marcello Moores  Register   On: 05/23/2015 07:24   ASSESSMENT /  PLAN:  PULMONARY 6/15 OTT>> A: OSA on CPAP (noncompliant with this) Bilateral pleural effusion 6/15 Acute resp failure secondary to volume overload. Required intubation. P:   SBT to extubate today. Titrate O2 for sats of 88-92% CPAP at night. IS per RT. Flutter valve per RT. OOB to chair. Ambulate.  CARDIOVASCULAR L IJ hdCVL >>> A:  Bradycardia, improved (thought secondary to hyperkalemia) PAF on coumadin, rate controlled H/o HTN P:  Telemetry monitoring KVO IVF. Neo for BP support. Hold PO antihypertensives (lisinopril, metoprolol, spironolactone) PRN metoprolol to keep SBP < 160  RENAL A:   Acute Kidney Injury, ddx includes dehydration, sepsis, medication related Hyperkalemia High AG metabolic acidosis, lactic elevated, metformin Renal stones P:   Volume overloaded, non responsive to lasix. Correct electrolytes as indicated. Nephrology consulted and hold HD for now. KVO IVF. Lasix 40 mg IV q6 x3 doses.  GASTROINTESTINAL A:   Suspected C. difficile colitis(neg pcr) Possible proctitis  Cholelitiasis(neg by [place]) P:   Renal diet. SUP: IV protonix  HEMATOLOGIC Lab Results  Component Value Date   INR 1.51* 05/23/2015   INR 1.77* 05/22/2015   INR 1.92* 05/21/2015  A:   Coumadin coagulopathy(reveresed)  P:  Hold coumadin. FFP x 4 done. Vit K. Follow Coags.  INFECTIOUS A:   Severe sepsis. Uncertain etiology at this time. ddx includes C. Dif colitis, or other intra-abdominal source Suspect C. Difficile colitis->neg c dif PCT 32 [place] abd 6/14 neg except gall stones P:   BCx2 6/13 >>> C. Dif PCR 6/13 >>>neg Abx: Vanc, start date 6/14 >>>6/16 Abx: Zosyn, start date 6/13 >>6/16 Abx: Flagyl, start date 6/14 >>>6/15 Abx: PO Vanc, start date 6/14 >>>6/15 Rocephin 6/16>>> Consider dc c dif tx 6/15 negative  ENDOCRINE A:  DM Hypothyroidism tsh 1.285 Cortisol 25 P:   Dc metformin CBG monitoring and SSI Continue IV synthroid  NEUROLOGIC A:    Anxiety related to resp distress. Sedation for tube tolerance with RASS goal -1, will use intermittent  IV F/V, suspect she will need very little sedation. P:   Sedated on vent PRN fentanyl/Versed  FAMILY  - Updates: No family bedside.  The patient is critically ill with multiple organ systems failure and requires high complexity decision making for assessment and support, frequent evaluation and titration of therapies, application of advanced monitoring technologies and extensive interpretation of multiple databases.   Critical Care Time devoted to patient care services described in this note is  35  Minutes. This time reflects time of care of this signee Dr [name]. This critical care time does not reflect procedure time, or teaching time or supervisory time of PA/NP/Med student/Med Resident etc but could involve care discussion time.  Rush Farmer, M.D. [facility] Pulmonary/Critical Care Medicine. Pager: [phone]. After hours pager: [phone].

## 2015-05-23 NOTE — Progress Notes (Signed)
Waist of 200 mL Fentanyl with Ines Bloomer, RN into sink.

## 2015-05-23 NOTE — Procedures (Signed)
Extubation Procedure Note  Patient Details:   Name: REAGEN GOATES DOB: 1924-12-22 MRN: [id number]   Airway Documentation:     Evaluation  O2 sats: stable throughout Complications: No apparent complications Patient did tolerate procedure well. Bilateral Breath Sounds: Clear, Diminished Suctioning: Oral, Airway Yes   Pt extubated to 3L/[place]. Pt was able to speak and cough post extubation, pt tolerated procedure well. Copious amounts of tenacious/thick yellow/clear secretions orally. Pt appeared anxious and was tachycardic. RT will continue to monitor.   Jesse Sans 05/23/2015, 10:45 AM

## 2015-05-24 DIAGNOSIS — E119 Type 2 diabetes mellitus without complications: Secondary | ICD-10-CM

## 2015-05-24 DIAGNOSIS — N390 Urinary tract infection, site not specified: Secondary | ICD-10-CM

## 2015-05-24 LAB — BASIC METABOLIC PANEL
ANION GAP: 18 — AB (ref 5–15)
BUN: 36 mg/dL — AB (ref 6–20)
CO2: 20 mmol/L — ABNORMAL LOW (ref 22–32)
Calcium: 8.3 mg/dL — ABNORMAL LOW (ref 8.9–10.3)
Chloride: 96 mmol/L — ABNORMAL LOW (ref 101–111)
Creatinine, Ser: 4.15 mg/dL — ABNORMAL HIGH (ref 0.44–1.00)
GFR calc non Af Amer: 9 mL/min — ABNORMAL LOW (ref >60)
GFR, EST AFRICAN AMERICAN: 10 mL/min — AB (ref >60)
Glucose, Bld: 172 mg/dL — ABNORMAL HIGH (ref 65–99)
POTASSIUM: 3.1 mmol/L — AB (ref 3.5–5.1)
Sodium: 134 mmol/L — ABNORMAL LOW (ref 135–145)

## 2015-05-24 LAB — GLUCOSE, CAPILLARY
GLUCOSE-CAPILLARY: 131 mg/dL — AB (ref 65–99)
Glucose-Capillary: 163 mg/dL — ABNORMAL HIGH (ref 65–99)
Glucose-Capillary: 166 mg/dL — ABNORMAL HIGH (ref 65–99)
Glucose-Capillary: 246 mg/dL — ABNORMAL HIGH (ref 65–99)
Glucose-Capillary: 155 mg/dL — ABNORMAL HIGH (ref 65–99)

## 2015-05-24 LAB — CBC
HEMATOCRIT: 29.3 % — AB (ref 36.0–46.0)
HEMOGLOBIN: 10.2 g/dL — AB (ref 12.0–15.0)
MCH: 32.6 pg (ref 26.0–34.0)
MCHC: 34.8 g/dL (ref 30.0–36.0)
MCV: 93.6 fL (ref 78.0–100.0)
Platelets: 208 10*3/uL (ref 150–400)
RBC: 3.13 MIL/uL — AB (ref 3.87–5.11)
RDW: 12.9 % (ref 11.5–15.5)
WBC: 9.4 10*3/uL (ref 4.0–10.5)

## 2015-05-24 LAB — PROTIME-INR
INR: 1.58 — AB (ref 0.00–1.49)
Prothrombin Time: 18.9 seconds — ABNORMAL HIGH (ref 11.6–15.2)

## 2015-05-24 LAB — MAGNESIUM: Magnesium: 2 mg/dL (ref 1.7–2.4)

## 2015-05-24 LAB — PHOSPHORUS: PHOSPHORUS: 4.1 mg/dL (ref 2.5–4.6)

## 2015-05-24 MED ORDER — METOPROLOL TARTRATE 50 MG PO TABS
50.0000 mg | ORAL_TABLET | Freq: Two times a day (BID) | ORAL | Status: DC
  Administered 2015-05-24 – 2015-05-26 (×5): 50 mg via ORAL
  Filled 2015-05-24 (×6): qty 1

## 2015-05-24 MED ORDER — LEVOTHYROXINE SODIUM 75 MCG PO TABS
75.0000 ug | ORAL_TABLET | Freq: Every day | ORAL | Status: DC
  Administered 2015-05-25 – 2015-05-30 (×6): 75 ug via ORAL
  Filled 2015-05-24 (×8): qty 1

## 2015-05-24 MED ORDER — INSULIN ASPART 100 UNIT/ML ~~LOC~~ SOLN: 0.0000 [IU] | Freq: Every day | SUBCUTANEOUS | Status: DC

## 2015-05-24 MED ORDER — ENSURE ENLIVE PO LIQD
237.0000 mL | Freq: Two times a day (BID) | ORAL | Status: DC
  Administered 2015-05-24 – 2015-05-30 (×13): 237 mL via ORAL

## 2015-05-24 MED ORDER — POTASSIUM CHLORIDE 10 MEQ/50ML IV SOLN
10.0000 meq | INTRAVENOUS | Status: AC
  Administered 2015-05-24 (×2): 10 meq via INTRAVENOUS
  Filled 2015-05-24 (×2): qty 50

## 2015-05-24 MED ORDER — METOPROLOL TARTRATE 1 MG/ML IV SOLN
2.5000 mg | INTRAVENOUS | Status: DC | PRN
  Administered 2015-05-25: 2.5 mg via INTRAVENOUS
  Administered 2015-05-25: 5 mg via INTRAVENOUS
  Filled 2015-05-24 (×2): qty 5

## 2015-05-24 MED ORDER — INSULIN ASPART 100 UNIT/ML ~~LOC~~ SOLN
0.0000 [IU] | Freq: Three times a day (TID) | SUBCUTANEOUS | Status: DC
  Administered 2015-05-24: 3 [IU] via SUBCUTANEOUS
  Administered 2015-05-24: 2 [IU] via SUBCUTANEOUS
  Administered 2015-05-25 (×2): 3 [IU] via SUBCUTANEOUS
  Administered 2015-05-25: 1 [IU] via SUBCUTANEOUS
  Administered 2015-05-26 (×2): 3 [IU] via SUBCUTANEOUS
  Administered 2015-05-27 (×2): 1 [IU] via SUBCUTANEOUS
  Administered 2015-05-27: 3 [IU] via SUBCUTANEOUS
  Administered 2015-05-28 (×2): 2 [IU] via SUBCUTANEOUS
  Administered 2015-05-28: 1 [IU] via SUBCUTANEOUS

## 2015-05-24 MED ORDER — POTASSIUM CHLORIDE 20 MEQ/15ML (10%) PO SOLN: 40.0000 meq | Freq: Once | ORAL | Status: DC

## 2015-05-24 NOTE — Progress Notes (Signed)
Admit: 05/20/2015 LOS: 3  64F with normal baseline GFR admitted with AKI, Hyperkalemia, Inc AG Metabolic Acidosis in setting of N/V/D, recent azithromycin for URI, and use of ACEi, MRB, and metformin at home. Progressive hypoxic RF 6/15 and intubation.    Subjective:  Extubated yesterday to 3L [place] SCr decreased this AM Having AF and RVR on Amio BP Stable No pressors   06/16 0701 - 06/17 0700 In: 1293.8 [I.V.:1233.8; NG/GT:10; IV Piggyback:50] Out: 790 [Urine:790]  Filed Weights   05/22/15 1828 05/23/15 0417 05/24/15 0350  Weight: 64.6 kg (142 lb 6.7 oz) 63.5 kg (139 lb 15.9 oz) 60.9 kg (134 lb 4.2 oz)    Scheduled Meds: . sodium chloride   Intravenous Once  . antiseptic oral rinse  7 mL Mouth Rinse QID  . cefTRIAXone (ROCEPHIN)  IV  1 g Intravenous Q24H  . chlorhexidine  15 mL Mouth Rinse BID  . insulin aspart  0-9 Units Subcutaneous 6 times per day  . levothyroxine  37.5 mcg Intravenous Daily  . pantoprazole (PROTONIX) IV  40 mg Intravenous Q12H  . potassium chloride  10 mEq Intravenous Q1 Hr x 2  . sodium chloride  1,000 mL Intravenous Once   Continuous Infusions: . amiodarone 30 mg/hr (05/23/15 2017)  . phenylephrine (NEO-SYNEPHRINE) Adult infusion Stopped (05/23/15 1523)   PRN Meds:.sodium chloride, sodium chloride, feeding supplement (NEPRO CARB STEADY), heparin, heparin, ondansetron **OR** ondansetron (ZOFRAN) IV  Current Labs: reviewed    Physical Exam:  Blood pressure 146/96, pulse 45, temperature 98.6 F (37 C), temperature source Oral, resp. rate 18, height 5\' 8"  (1.727 m), weight 60.9 kg (134 lb 4.2 oz), SpO2 100 %. GEN: intubated and sedated ENT: NCAT, ETT in place EYES: Eyese closed CV: RRR, no rub PULM: coarse bs b/l ABD: mild ttp throughout, no r/g, quiet SKIN: no rashes EXT:no LEE  A/P 1. AKI, nonoliguric 1. Normal baseline SCr 2. Suspect original presentation 2/2 Hypovolemia + ACEi + MRB +/- ATN 3. Quickly wentfrom hypovolemia to hypervolemia  6/15 req HD 4. Now Hold on further HD currently, good UOP, improved resp status, improving SCr 5. Probably would respond to diuretics as needed -- volume status stable currently 2. Hyperkalemia; 2/2 ACEi + MRB + #1; holding these medications;Resolved 3. Inc AG Metabolic Acidosis; Likely from sepsis + metformin; Resolved with HD, holding medications, bicarb replacement 4. HTN: holding BP meds 5. DM2: holding metformin  Pearson Grippe MD 05/24/2015, 8:19 AM   Recent Labs Lab 05/22/15 0420 05/23/15 0420 05/24/15 0400  NA 130* 136 134*  K 4.7 3.7 3.1*  CL 97* 95* 96*  CO2 17* 23 20*  GLUCOSE 166* 146* 172*  BUN 59* 33* 36*  CREATININE 6.63* 4.28* 4.15*  CALCIUM 7.4* 8.0* 8.3*  PHOS  --  4.8* 4.1    Recent Labs Lab 05/20/15 2300  05/21/15 1126 05/23/15 0420 05/24/15 0400  WBC 12.7*  < > 11.3* 11.4* 9.4  NEUTROABS 8.9*  --   --   --   --   HGB 10.8*  < > 10.3* 10.6* 10.2*  HCT 30.9*  < > 29.9* 29.8* 29.3*  MCV 95.1  < > 95.2 91.7 93.6  PLT 265  < > 231 243 208  < > = values in this interval not displayed.

## 2015-05-24 NOTE — Progress Notes (Signed)
Nutrition Follow-up  DOCUMENTATION CODES:  Non-severe (moderate) malnutrition in context of chronic illness  INTERVENTION:  Ensure Enlive (each supplement provides 350kcal and 20 grams of protein) BID  NUTRITION DIAGNOSIS:  Malnutrition related to chronic illness as evidenced by mild depletion of body fat, mild depletion of muscle mass.  GOAL:  Patient will meet greater than or equal to 90% of their needs  Not met  MONITOR:  PO intake, Supplement acceptance, Labs, Weight trends, I & O's  REASON FOR ASSESSMENT:  Malnutrition Screening Tool, Ventilator    ASSESSMENT: Patient was brought to the ED after son found the patient was increasingly weak, confused, and having persistent nausea and vomiting. Found to be coagulopathic, hypotensive, bradycardic, and with hyperkalemia. Pt was intubated, extubated 6/16. Pt had some difficulty swallowing liquids yesterday, speech evaluation today cleared pt for regular diet (CHO mod). Pt states she ate well PTA, usually 3 meals a day. She does not snack, lives active life and loves to sing in the church. While she is very clear on some subject, even speech pathologist noted that she is confused on others. History may not be accurate as told by pt. Pt stated she lost wt recently, however wt history shows pt has been gaining wt, pt states her usual wt is 122 Lb (current wt 134 Lb). Pt states she never tried Ensure, but is agreeable to BID for recovery. Will continue to monitor.  Labs reviewed: Na 134, K 3.1, Glu 172, BUN 36, Cr 4.15, Ca 8.3  Height:  Ht Readings from Last 1 Encounters:  05/22/15 5' 8"  (1.727 m)    Weight:  Wt Readings from Last 1 Encounters:  05/24/15 134 lb 4.2 oz (60.9 kg)    Ideal Body Weight:  63.6 kg  Wt Readings from Last 10 Encounters:  05/24/15 134 lb 4.2 oz (60.9 kg)  04/03/15 126 lb 3.2 oz (57.244 kg)  01/09/15 126 lb (57.153 kg)  11/20/14 122 lb 3.2 oz (55.43 kg)  07/23/14 123 lb 6.4 oz (55.974 kg)   07/17/14 122 lb 3.2 oz (55.43 kg)  04/18/14 124 lb (56.246 kg)  12/12/13 121 lb 12.8 oz (55.248 kg)  10/26/13 122 lb (55.339 kg)  08/22/13 118 lb (53.524 kg)    BMI:  Body mass index is 20.42 kg/(m^2).  Estimated Nutritional Needs:  Kcal:  1400 - 1600  Protein:  80 - 95 g  Fluid:  >1.5 L  Skin:  Reviewed, no issues  Diet Order:  Diet Carb Modified Fluid consistency:: Thin; Room service appropriate?: Yes  EDUCATION NEEDS:  No education needs identified at this time   Intake/Output Summary (Last 24 hours) at 05/24/15 1007 Last data filed at 05/24/15 1000  Gross per 24 hour  Intake 1334.82 ml  Output    890 ml  Net 444.82 ml    Last BM:  6/14  Akeyla Molden A. [facility] Dietetic Intern Pager: [phone] 05/24/2015 10:15 AM

## 2015-05-24 NOTE — Evaluation (Addendum)
Physical Therapy Evaluation Patient Details Name: [name] MRN: [id number] DOB: 1925/08/11 Today's Date: 05/24/2015   History of Present Illness  Adm 05/20/15 acute kidney injury with hyperkalemia, bil pleural effusions, ascites, respiratory distress progressed with intubation 6/15-6/16, afib with RVR. PMHx- recent fall, DM, afib (anticoagulated), HTN, depression    Clinical Impression  Pt admitted with above diagnosis. Currently pt limited by AMS (decreased safety) and elevated HR due to afib. Pt currently with functional limitations due to the deficits listed below (see PT Problem List).  Pt will benefit from skilled PT to increase their independence and safety with mobility to allow discharge to the venue listed below.       Follow Up Recommendations Supervision/Assistance - 24 hour (current recommendation due to AMS; ?family can provide) Will continue to assess    Equipment Recommendations  Other (comment) (TBA)    Recommendations for Other Services OT consult     Precautions / Restrictions Precautions Precautions: Fall      Mobility  Bed Mobility               General bed mobility comments: up in chair  Transfers Overall transfer level: Needs assistance Equipment used: None Transfers: Sit to/from Stand Sit to Stand: Min guard         General transfer comment: used armrests; slow and guarded, but no assist needed  Ambulation/Gait             General Gait Details: unable due to incr HR 138  Stairs            Wheelchair Mobility    Modified Rankin (Stroke Patients Only)       Balance Overall balance assessment: Needs assistance         Standing balance support: No upper extremity supported Standing balance-Leahy Scale: Fair           Rhomberg - Eyes Opened:  (unable to obtain position, or hold once assisted)   High level balance activites: Other (comment) High Level Balance Comments: unilaterally raising each arm overhead  in standing with minguard assist             Pertinent Vitals/Pain HR 106-138 (with standing transfer) SaO2 95-98% on Room air RR 17-22  Pain Assessment: No/denies pain    Home Living Family/patient expects to be discharged to:: Private residence Living Arrangements: Alone Available Help at Discharge: Family;Available PRN/intermittently Type of Home: House Home Access: Stairs to enter Entrance Stairs-Rails: Right Entrance Stairs-Number of Steps: 3 Home Layout: One level Home Equipment: Walker - 4 wheels;Cane - single point;Shower seat - built in;Grab bars - toilet;Grab bars - tub/shower      Prior Function Level of Independence: Independent         Comments: per pt, drives, groceries, housework; she occasionally uses her rollator "on rough mornings"     Hand Dominance   Dominant Hand: Right    Extremity/Trunk Assessment   Upper Extremity Assessment: Generalized weakness           Lower Extremity Assessment: Generalized weakness;RLE deficits/detail;LLE deficits/detail      Cervical / Trunk Assessment: Normal  Communication   Communication: HOH  Cognition Arousal/Alertness: Awake/alert Behavior During Therapy: WFL for tasks assessed/performed Overall Cognitive Status: No family/caregiver present to determine baseline cognitive functioning Area of Impairment: Orientation;Awareness;Attention Orientation Level: Time;Situation Current Attention Level: Sustained       Awareness:  (pre-intellectual (no awareness of recent illness))   General Comments: Pt reports no problems with her memory PTA ;  conversation off course/topic numerous times     General Comments      Exercises        Assessment/Plan    PT Assessment Patient needs continued PT services  PT Diagnosis Difficulty walking;Altered mental status   PT Problem List Decreased activity tolerance;Decreased balance;Decreased mobility;Decreased cognition;Decreased knowledge of use of  DME;Decreased safety awareness;Cardiopulmonary status limiting activity;Impaired sensation  PT Treatment Interventions DME instruction;Gait training;Stair training;Functional mobility training;Therapeutic activities;Therapeutic exercise;Balance training;Cognitive remediation;Patient/family education   PT Goals (Current goals can be found in the Care Plan section) Acute Rehab PT Goals Patient Stated Goal: return home when well PT Goal Formulation: With patient Time For Goal Achievement: 06/06/15 Potential to Achieve Goals: Good Additional Goals Additional Goal #1: Standardized balance assessment when able to complete to assess fall risk.    Frequency Min 3X/week   Barriers to discharge Decreased caregiver support      Co-evaluation               End of Session Equipment Utilized During Treatment: Gait belt Activity Tolerance: Treatment limited secondary to medical complications (Comment) (HR 106-138) Patient left: in chair;with call bell/phone within reach           Time: 3736-6815 PT Time Calculation (min) (ACUTE ONLY): 25 min   Charges:   PT Evaluation $Initial PT Evaluation Tier I: 1 Procedure PT Treatments $Therapeutic Activity: 8-22 mins   PT G Codes:        [name] 29-May-2015, 5:03 PM Pager [phone]

## 2015-05-24 NOTE — Evaluation (Signed)
Clinical/Bedside Swallow Evaluation Patient Details  Name: [name] MRN: [id number] Date of Birth: 05/12/1925  Today's Date: 05/24/2015 Time: SLP Start Time (ACUTE ONLY): 0867 SLP Stop Time (ACUTE ONLY): 0942 SLP Time Calculation (min) (ACUTE ONLY): 16 min  Past Medical History:  Past Medical History  Diagnosis Date  . OSA (obstructive sleep apnea)     AHI-44/hr, AHI during REM 61.8/hr, avg O2 during REM and NREM 96%  . Atrial fibrillation   . Myocardial infarct   . Diabetes mellitus   . Hearing loss   . Hypertension   . Hyperlipidemia   . Senile degeneration of brain   . Other generalized ischemic cerebrovascular disease   . Personal history of fall   . Pain in joint, shoulder region   . 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 Loss of weight   . Chronic kidney disease, stage II (mild)   . Obstructive sleep apnea (adult) (pediatric)   . Senile degeneration of brain   . Dizziness and giddiness   . Personal history of noncompliance with medical treatment, presenting hazards to health   . Dyskinesia of esophagus   . Shortness of breath   . Chest pain, unspecified 06/19/2009  . Long term (current) use of anticoagulants   . Unspecified hereditary and idiopathic peripheral neuropathy   . Pain in joint, site unspecified   . Carpal tunnel syndrome   . Other hammer toe (acquired)   . Alopecia areata   . Dysphagia, unspecified(787.20)   . Occlusion and stenosis of carotid artery without mention of cerebral infarction   . Other symptoms involving cardiovascular system   . Other acne   . Allergic rhinitis, cause unspecified   . Type II or unspecified type diabetes mellitus without mention of complication, uncontrolled   . Varicose veins of lower extremities   . Cervicalgia   . Disturbance of salivary secretion   . Insomnia, unspecified   . Unspecified hypothyroidism   . Palpitations 715.90  . Other malaise and fatigue   . Diverticulosis of colon  (without mention of hemorrhage)   . Benign neoplasm of colon   . Depressive disorder, not elsewhere classified   . Syncope and collapse   . Paroxysmal atrial fibrillation   . Hypertension   . Dizziness    Past Surgical History:  Past Surgical History  Procedure Laterality Date  . Total abdominal hysterectomy    . Tonsillectomy    . Appendectomy    . Hand surgery    . Cataract od  10/04/2012  . Cataract os  01/31/2013  . Carotid doppler  07/25/2012    Bilat internal carotid arteries demonstrate 1-395 stenoses.  . Transthoracic echocardiogram  04/24/2011    EF >55%, moderate tricuspid regurg.   HPI:  81 year old female who was admitted 6/13 with acute kidney injury with hyperkalemia and lactic acidosis. Also coagulopathic from coumadin. Limited improvement with ABX and volume. Intubated from 6/15 to 6/16.    Assessment / Plan / Recommendation Clinical Impression  Pt demonstrates normal swallow function, adequate airway protection. Pt may initiate a regular diet and thin liquids. No SLP f/u needed.     Aspiration Risk  Mild    Diet Recommendation Age appropriate regular solids;Thin   Medication Administration: Whole meds with liquid    Other  Recommendations     Follow Up Recommendations       Frequency and Duration        Pertinent Vitals/Pain NA  SLP Swallow Goals     Swallow Study Prior Functional Status       General Other Pertinent Information: 81 year old female who was admitted 6/13 with acute kidney injury with hyperkalemia and lactic acidosis. Also coagulopathic from coumadin. Limited improvement with ABX and volume. Intubated from 6/15 to 6/16.  Type of Study: Bedside swallow evaluation Previous Swallow Assessment: none Diet Prior to this Study: NPO History of Recent Intubation: No Behavior/Cognition: Alert;Cooperative;Confused;Pleasant mood Oral Cavity - Dentition: Adequate natural dentition/normal for age Self-Feeding Abilities: Able to feed  self Patient Positioning: Upright in bed Baseline Vocal Quality: Normal Volitional Cough: Strong Volitional Swallow: Able to elicit    Oral/Motor/Sensory Function Overall Oral Motor/Sensory Function: Appears within functional limits for tasks assessed   Ice Chips     Thin Liquid Thin Liquid: Within functional limits    Nectar Thick Nectar Thick Liquid: Not tested   Honey Thick Honey Thick Liquid: Not tested   Puree Puree: Within functional limits   Solid   GO    Solid: Within functional limits      [facility], MA CCC-SLP [phone]  Lynann Beaver 05/24/2015,9:49 AM

## 2015-05-24 NOTE — Progress Notes (Signed)
Pt admitted in from 77M into 3s06, pt oriented to room and pt guide given. Vital signs within defined limits. Foley catheter still in place, per previous nurse Wetzel Bjornstad RN foley not d/c'd per nephrology's service request and with plan to reassess need tomorrow 05/25/15. Foley care done on admission. Will continue to follow.

## 2015-05-24 NOTE — Progress Notes (Signed)
[facility] Progress Note Patient Name: MELINDA GWINNER DOB: 1925-08-22 MRN: [id number]   Date of Service  05/24/2015  HPI/Events of Note  k low  eICU Interventions  k     Intervention Category Minor Interventions: Electrolytes abnormality - evaluation and management  Raylene Miyamoto. 05/24/2015, 6:18 AM

## 2015-05-24 NOTE — Progress Notes (Signed)
Foley catheter and HD catheter to stay in place, until tomorrow 6/18 per Dr. Alva Garnet.  Pending nephrology decision of any other HD encounters.

## 2015-05-24 NOTE — Progress Notes (Signed)
Extubated yesterday and tolerating Vigorous and freq cough - rattling No distress No new complaints  Filed Vitals:   05/24/15 1430 05/24/15 1500 05/24/15 1530 05/24/15 1552  BP: 131/87 135/86 134/88 131/76  Pulse: 90 90 108 88  Temp:    98.4 F (36.9 C)  TempSrc:    Oral  Resp: 20 23 16 20   Height:      Weight:      SpO2: 97% 97% 88% 99%    NAD No JVD noted Scattered rhonchi IRIR, tachy, no M noted NABS, soft Ext warm, no edema No focal neuro deficits   BMET    Component Value Date/Time   NA 134* 05/24/2015 0400   NA 140 03/28/2015 0922   K 3.1* 05/24/2015 0400   CL 96* 05/24/2015 0400   CO2 20* 05/24/2015 0400   GLUCOSE 172* 05/24/2015 0400   GLUCOSE 88 03/28/2015 0922   BUN 36* 05/24/2015 0400   BUN 24 03/28/2015 0922   CREATININE 4.15* 05/24/2015 0400   CALCIUM 8.3* 05/24/2015 0400   GFRNONAA 9* 05/24/2015 0400   GFRAA 10* 05/24/2015 0400     CBC    Component Value Date/Time   WBC 9.4 05/24/2015 0400   RBC 3.13* 05/24/2015 0400   HGB 10.2* 05/24/2015 0400   HCT 29.3* 05/24/2015 0400   PLT 208 05/24/2015 0400   MCV 93.6 05/24/2015 0400   MCH 32.6 05/24/2015 0400   MCHC 34.8 05/24/2015 0400   RDW 12.9 05/24/2015 0400   LYMPHSABS 2.4 05/20/2015 2300   MONOABS 1.3* 05/20/2015 2300   EOSABS 0.0 05/20/2015 2300   BASOSABS 0.0 05/20/2015 2300     CXR: NNF  IMPRESSION: Acute VDRF due to volume overload - resolved AFRVR H/O Htn AKI - Uo improving, Cr improving Hyperkalemia - resolved Recent LGIB in setting of excessive anticoagulation Elevated PCT without clear source - UTI most likely Cholelithiasis DM 2   PLAN: Transfer to SDU [facility] to resume primary duties as of AM 6/18 and PCCM to sign off - discussed with Dr Sherral Hammers Will leave HD cath and Foley cath one more day  Please consider removal 6/18 Advance diet Advance activity Change SSI to ACHS Cont ceftriaxone for now Follow cultures to completion Recheck PCT 6/18 Resume scheduled  metoprolol PRN metoprolol to maintain HR < 115/min  Merton Border, MD ; [facility] service Mobile [phone].  After 5:30 PM or weekends, call [phone]

## 2015-05-25 ENCOUNTER — Inpatient Hospital Stay (HOSPITAL_COMMUNITY): Payer: Medicare Other

## 2015-05-25 DIAGNOSIS — I482 Chronic atrial fibrillation: Secondary | ICD-10-CM

## 2015-05-25 DIAGNOSIS — R69 Illness, unspecified: Secondary | ICD-10-CM

## 2015-05-25 DIAGNOSIS — E440 Moderate protein-calorie malnutrition: Secondary | ICD-10-CM | POA: Diagnosis present

## 2015-05-25 LAB — COMPREHENSIVE METABOLIC PANEL
ALT: 105 U/L — AB (ref 14–54)
AST: 64 U/L — AB (ref 15–41)
Albumin: 3.1 g/dL — ABNORMAL LOW (ref 3.5–5.0)
Alkaline Phosphatase: 228 U/L — ABNORMAL HIGH (ref 38–126)
Anion gap: 16 — ABNORMAL HIGH (ref 5–15)
BUN: 32 mg/dL — ABNORMAL HIGH (ref 6–20)
CO2: 21 mmol/L — AB (ref 22–32)
Calcium: 8.7 mg/dL — ABNORMAL LOW (ref 8.9–10.3)
Chloride: 97 mmol/L — ABNORMAL LOW (ref 101–111)
Creatinine, Ser: 2.82 mg/dL — ABNORMAL HIGH (ref 0.44–1.00)
GFR calc Af Amer: 16 mL/min — ABNORMAL LOW (ref >60)
GFR, EST NON AFRICAN AMERICAN: 14 mL/min — AB (ref >60)
Glucose, Bld: 135 mg/dL — ABNORMAL HIGH (ref 65–99)
Potassium: 2.8 mmol/L — ABNORMAL LOW (ref 3.5–5.1)
SODIUM: 134 mmol/L — AB (ref 135–145)
Total Bilirubin: 0.9 mg/dL (ref 0.3–1.2)
Total Protein: 6.2 g/dL — ABNORMAL LOW (ref 6.5–8.1)

## 2015-05-25 LAB — GLUCOSE, CAPILLARY
GLUCOSE-CAPILLARY: 111 mg/dL — AB (ref 65–99)
Glucose-Capillary: 137 mg/dL — ABNORMAL HIGH (ref 65–99)
Glucose-Capillary: 225 mg/dL — ABNORMAL HIGH (ref 65–99)
Glucose-Capillary: 213 mg/dL — ABNORMAL HIGH (ref 65–99)

## 2015-05-25 LAB — CBC
HEMATOCRIT: 30.3 % — AB (ref 36.0–46.0)
Hemoglobin: 10.8 g/dL — ABNORMAL LOW (ref 12.0–15.0)
MCH: 32.7 pg (ref 26.0–34.0)
MCHC: 35.6 g/dL (ref 30.0–36.0)
MCV: 91.8 fL (ref 78.0–100.0)
Platelets: 243 10*3/uL (ref 150–400)
RBC: 3.3 MIL/uL — ABNORMAL LOW (ref 3.87–5.11)
RDW: 12.7 % (ref 11.5–15.5)
WBC: 10.6 10*3/uL — ABNORMAL HIGH (ref 4.0–10.5)

## 2015-05-25 LAB — PROTIME-INR
INR: 1.56 — ABNORMAL HIGH (ref 0.00–1.49)
Prothrombin Time: 18.7 seconds — ABNORMAL HIGH (ref 11.6–15.2)

## 2015-05-25 LAB — PROCALCITONIN: PROCALCITONIN: 1.56 ng/mL

## 2015-05-25 MED ORDER — WARFARIN SODIUM 5 MG PO TABS
5.0000 mg | ORAL_TABLET | Freq: Once | ORAL | Status: AC
  Administered 2015-05-25: 5 mg via ORAL
  Filled 2015-05-25: qty 1

## 2015-05-25 MED ORDER — POTASSIUM CHLORIDE CRYS ER 20 MEQ PO TBCR
40.0000 meq | EXTENDED_RELEASE_TABLET | Freq: Once | ORAL | Status: AC
  Administered 2015-05-25: 40 meq via ORAL
  Filled 2015-05-25: qty 2

## 2015-05-25 MED ORDER — CETYLPYRIDINIUM CHLORIDE 0.05 % MT LIQD
7.0000 mL | Freq: Two times a day (BID) | OROMUCOSAL | Status: DC
  Administered 2015-05-25 – 2015-05-30 (×9): 7 mL via OROMUCOSAL

## 2015-05-25 NOTE — Progress Notes (Addendum)
S: Notes some secretions.  Less dyspnea.  No active chest pain  O: Filed Vitals:   05/24/15 2222 05/24/15 2300 05/25/15 0345 05/25/15 0800  BP: 150/86  138/86 164/102  Pulse: 115 109 122 115  Temp:   98.3 F (36.8 C) 98 F (36.7 C)  TempSrc:   Oral Oral  Resp:  31 16 14   Height:      Weight:      SpO2:  93% 96% 96%    NAD No JVD noted Scattered rhonchi IRIR, tachy, no M noted NABS, soft Ext warm, no edema No focal neuro deficits   BMET    Component Value Date/Time   NA 134* 05/25/2015 0730   NA 140 03/28/2015 0922   K 2.8* 05/25/2015 0730   CL 97* 05/25/2015 0730   CO2 21* 05/25/2015 0730   GLUCOSE 135* 05/25/2015 0730   GLUCOSE 88 03/28/2015 0922   BUN 32* 05/25/2015 0730   BUN 24 03/28/2015 0922   CREATININE 2.82* 05/25/2015 0730   CALCIUM 8.7* 05/25/2015 0730   GFRNONAA 14* 05/25/2015 0730   GFRAA 16* 05/25/2015 0730     CBC    Component Value Date/Time   WBC 10.6* 05/25/2015 0730   RBC 3.30* 05/25/2015 0730   HGB 10.8* 05/25/2015 0730   HCT 30.3* 05/25/2015 0730   PLT 243 05/25/2015 0730   MCV 91.8 05/25/2015 0730   MCH 32.7 05/25/2015 0730   MCHC 35.6 05/25/2015 0730   RDW 12.7 05/25/2015 0730   LYMPHSABS 2.4 05/20/2015 2300   MONOABS 1.3* 05/20/2015 2300   EOSABS 0.0 05/20/2015 2300   BASOSABS 0.0 05/20/2015 2300    Recent Labs Lab 05/21/15 1629 05/22/15 0420 05/23/15 0420 05/24/15 0400 05/25/15 0730  INR 1.92* 1.77* 1.51* 1.58* 1.56*     6/18 CXR: improved aeration and less edema  IMPRESSION: Acute VDRF due to volume overload - resolved AFRVR, improved H/O Htn AKI - Uo improving, Cr improving, no longer needs HD Hyperkalemia - resolved Hypokalemia- d/t AKI resolution and diuresis Recent LGIB in setting of excessive anticoagulation, resolved Elevated PCT without clear source - UTI most likely Cholelithiasis DM 2   PLAN: Keep SDU TRH was not aware of tfr, will call again today to make tfr official for 6/19 D/c HD  catheter and foley Cont diet Advance activity Cont  SSI to ACHS Cont ceftriaxone for now Follow cultures to completion F/u  PCT 6/18 Cont scheduled metoprolol PRN metoprolol to maintain HR < 115/min Replete K  Will need to resume warfarin , d/t afib   Glyn Ade  [phone]  Cell  [phone]  If no response or cell goes to voicemail, call beeper [phone] 9:36 AM 05/25/2015

## 2015-05-25 NOTE — Progress Notes (Signed)
Admit: 05/20/2015 LOS: 4  28F with normal baseline GFR admitted with AKI, Hyperkalemia, Inc AG Metabolic Acidosis in setting of N/V/D, recent azithromycin for URI, and use of ACEi, MRB, and metformin at home. Progressive hypoxic RF 6/15 and intubation.    Subjective:  Doing great, SCR falling, good UOP 2L [place]   06/17 0701 - 06/18 0700 In: 1365.5 [P.O.:1194; I.V.:121.5; IV Piggyback:50] Out: 1300 [Urine:1300]  Filed Weights   05/22/15 1828 05/23/15 0417 05/24/15 0350  Weight: 64.6 kg (142 lb 6.7 oz) 63.5 kg (139 lb 15.9 oz) 60.9 kg (134 lb 4.2 oz)    Scheduled Meds: . cefTRIAXone (ROCEPHIN)  IV  1 g Intravenous Q24H  . feeding supplement (ENSURE ENLIVE)  237 mL Oral BID BM  . insulin aspart  0-5 Units Subcutaneous QHS  . insulin aspart  0-9 Units Subcutaneous TID WC  . levothyroxine  75 mcg Oral QAC breakfast  . metoprolol tartrate  50 mg Oral BID  . potassium chloride  40 mEq Oral Once   Continuous Infusions:   PRN Meds:.metoprolol, [DISCONTINUED] ondansetron **OR** ondansetron (ZOFRAN) IV  Current Labs: reviewed    Physical Exam:  Blood pressure 164/102, pulse 115, temperature 98 F (36.7 C), temperature source Oral, resp. rate 14, height 5\' 8"  (1.727 m), weight 60.9 kg (134 lb 4.2 oz), SpO2 96 %. GEN: intubated and sedated ENT: NCAT, ETT in place EYES: Eyese closed CV: RRR, no rub PULM: coarse bs b/l ABD: mild ttp throughout, no r/g, quiet SKIN: no rashes EXT:no LEE  A/P 1. AKI, nonoliguric 1. Normal baseline SCr 2. Suspect original presentation 2/2 Hypovolemia + ACEi + MRB +/- ATN 3. Quickly went from hypovolemia to hypervolemia 6/15 req HD 4. Now recovering  5. Agree with removal of HD catheter 2. Hyperkalemia; 2/2 ACEi + MRB + #1; holding these medications; Resolved 3. Inc AG Metabolic Acidosis; Likely from sepsis + metformin; Resolved with HD, holding medications, bicarb replacement 4. HTN: holding BP meds 5. DM2: holding metformin  Will sign off for now.   Please call with any questions or concerns.  Pt does not need follow up with nephrology but if any questions please call.    Pearson Grippe MD 05/25/2015, 9:52 AM   Recent Labs Lab 05/23/15 0420 05/24/15 0400 05/25/15 0730  NA 136 134* 134*  K 3.7 3.1* 2.8*  CL 95* 96* 97*  CO2 23 20* 21*  GLUCOSE 146* 172* 135*  BUN 33* 36* 32*  CREATININE 4.28* 4.15* 2.82*  CALCIUM 8.0* 8.3* 8.7*  PHOS 4.8* 4.1  --     Recent Labs Lab 05/20/15 2300  05/23/15 0420 05/24/15 0400 05/25/15 0730  WBC 12.7*  < > 11.4* 9.4 10.6*  NEUTROABS 8.9*  --   --   --   --   HGB 10.8*  < > 10.6* 10.2* 10.8*  HCT 30.9*  < > 29.8* 29.3* 30.3*  MCV 95.1  < > 91.7 93.6 91.8  PLT 265  < > 243 208 243  < > = values in this interval not displayed.

## 2015-05-25 NOTE — Progress Notes (Signed)
ANTICOAGULATION CONSULT NOTE - Initial Consult  Pharmacy Consult for warfarin Indication: Afib  No Known Allergies  Patient Measurements: Height: 5\' 8"  (172.7 cm) Weight: 134 lb 4.2 oz (60.9 kg) IBW/kg (Calculated) : 63.9  Vital Signs: Temp: 98 F (36.7 C) (06/18 0800) Temp Source: Oral (06/18 0800) BP: 164/102 mmHg (06/18 0800) Pulse Rate: 115 (06/18 0800)  Labs:  Recent Labs  05/23/15 0420 05/24/15 0400 05/25/15 0730  HGB 10.6* 10.2* 10.8*  HCT 29.8* 29.3* 30.3*  PLT 243 208 243  APTT 32  --   --   LABPROT 18.3* 18.9* 18.7*  INR 1.51* 1.58* 1.56*  CREATININE 4.28* 4.15* 2.82*    Estimated Creatinine Clearance: 13.5 mL/min (by C-G formula based on Cr of 2.82).   Medical History: Past Medical History  Diagnosis Date  . OSA (obstructive sleep apnea)     AHI-44/hr, AHI during REM 61.8/hr, avg O2 during REM and NREM 96%  . Atrial fibrillation   . Myocardial infarct   . Diabetes mellitus   . Hearing loss   . Hypertension   . Hyperlipidemia   . Senile degeneration of brain   . Other generalized ischemic cerebrovascular disease   . Personal history of fall   . Pain in joint, shoulder region   . 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 Loss of weight   . Chronic kidney disease, stage II (mild)   . Obstructive sleep apnea (adult) (pediatric)   . Senile degeneration of brain   . Dizziness and giddiness   . Personal history of noncompliance with medical treatment, presenting hazards to health   . Dyskinesia of esophagus   . Shortness of breath   . Chest pain, unspecified 06/19/2009  . Long term (current) use of anticoagulants   . Unspecified hereditary and idiopathic peripheral neuropathy   . Pain in joint, site unspecified   . Carpal tunnel syndrome   . Other hammer toe (acquired)   . Alopecia areata   . Dysphagia, unspecified(787.20)   . Occlusion and stenosis of carotid artery without mention of cerebral infarction   . Other  symptoms involving cardiovascular system   . Other acne   . Allergic rhinitis, cause unspecified   . Type II or unspecified type diabetes mellitus without mention of complication, uncontrolled   . Varicose veins of lower extremities   . Cervicalgia   . Disturbance of salivary secretion   . Insomnia, unspecified   . Unspecified hypothyroidism   . Palpitations 715.90  . Other malaise and fatigue   . Diverticulosis of colon (without mention of hemorrhage)   . Benign neoplasm of colon   . Depressive disorder, not elsewhere classified   . Syncope and collapse   . Paroxysmal atrial fibrillation   . Hypertension   . Dizziness     Medications:  Scheduled:  . cefTRIAXone (ROCEPHIN)  IV  1 g Intravenous Q24H  . feeding supplement (ENSURE ENLIVE)  237 mL Oral BID BM  . insulin aspart  0-5 Units Subcutaneous QHS  . insulin aspart  0-9 Units Subcutaneous TID WC  . levothyroxine  75 mcg Oral QAC breakfast  . metoprolol tartrate  50 mg Oral BID  . warfarin  5 mg Oral ONCE-1800   Infusions:    Assessment: 81 y/o F from home with syncopal episode, WBC mildly elevated, pt noted to be in acute renal failure with markedly elevated Scr (baseline appears to be 1-1.2), elevated lactic acid, other labs as above. Coumadin PTA for hx  afib (INR 6.09>>1.51) - s/p vitamin k sq 10mg  x 2 + FFP x4.  Pharmacy consulted to resume warfarin in the setting of afib.  Home dose: 7.5mg  on Mon/Wed/Fri, 5mg  all other days  INR SUBtherapeutic at 1.56.  No reports of bleeding at this time.     Goal of Therapy:  INR 2-3 Monitor platelets by anticoagulation protocol: Yes   Plan:  - Warfarin 5 mg PO x 1 tonight - Monitor CBC and s/sx of bleeding - Daily INR  Hassie Bruce, Pharm. D. Clinical Pharmacy Resident Pager: [phone] Ph: [phone] 05/25/2015 11:26 AM

## 2015-05-26 DIAGNOSIS — E876 Hypokalemia: Secondary | ICD-10-CM

## 2015-05-26 DIAGNOSIS — D689 Coagulation defect, unspecified: Secondary | ICD-10-CM | POA: Diagnosis present

## 2015-05-26 DIAGNOSIS — R001 Bradycardia, unspecified: Secondary | ICD-10-CM

## 2015-05-26 DIAGNOSIS — I9589 Other hypotension: Secondary | ICD-10-CM

## 2015-05-26 DIAGNOSIS — K625 Hemorrhage of anus and rectum: Secondary | ICD-10-CM | POA: Diagnosis present

## 2015-05-26 LAB — GLUCOSE, CAPILLARY
GLUCOSE-CAPILLARY: 204 mg/dL — AB (ref 65–99)
GLUCOSE-CAPILLARY: 159 mg/dL — AB (ref 65–99)
Glucose-Capillary: 109 mg/dL — ABNORMAL HIGH (ref 65–99)
Glucose-Capillary: 219 mg/dL — ABNORMAL HIGH (ref 65–99)

## 2015-05-26 LAB — BASIC METABOLIC PANEL
Anion gap: 10 (ref 5–15)
BUN: 30 mg/dL — ABNORMAL HIGH (ref 6–20)
CHLORIDE: 102 mmol/L (ref 101–111)
CO2: 25 mmol/L (ref 22–32)
CREATININE: 2.02 mg/dL — AB (ref 0.44–1.00)
Calcium: 8.7 mg/dL — ABNORMAL LOW (ref 8.9–10.3)
GFR calc Af Amer: 24 mL/min — ABNORMAL LOW (ref >60)
GFR calc non Af Amer: 21 mL/min — ABNORMAL LOW (ref >60)
Glucose, Bld: 109 mg/dL — ABNORMAL HIGH (ref 65–99)
Potassium: 3.2 mmol/L — ABNORMAL LOW (ref 3.5–5.1)
SODIUM: 137 mmol/L (ref 135–145)

## 2015-05-26 LAB — CBC
HCT: 30.7 % — ABNORMAL LOW (ref 36.0–46.0)
Hemoglobin: 10.8 g/dL — ABNORMAL LOW (ref 12.0–15.0)
MCH: 32.7 pg (ref 26.0–34.0)
MCHC: 35.2 g/dL (ref 30.0–36.0)
MCV: 93 fL (ref 78.0–100.0)
Platelets: 255 10*3/uL (ref 150–400)
RBC: 3.3 MIL/uL — ABNORMAL LOW (ref 3.87–5.11)
RDW: 12.8 % (ref 11.5–15.5)
WBC: 11.1 10*3/uL — AB (ref 4.0–10.5)

## 2015-05-26 LAB — PROTIME-INR
INR: 1.52 — AB (ref 0.00–1.49)
Prothrombin Time: 18.4 seconds — ABNORMAL HIGH (ref 11.6–15.2)

## 2015-05-26 MED ORDER — HYDRALAZINE HCL 20 MG/ML IJ SOLN: 5.0000 mg | Freq: Four times a day (QID) | INTRAMUSCULAR | Status: DC | PRN

## 2015-05-26 MED ORDER — PANTOPRAZOLE SODIUM 40 MG PO TBEC
40.0000 mg | DELAYED_RELEASE_TABLET | Freq: Every day | ORAL | Status: DC
  Administered 2015-05-26 – 2015-05-30 (×5): 40 mg via ORAL
  Filled 2015-05-26 (×5): qty 1

## 2015-05-26 MED ORDER — WARFARIN SODIUM 5 MG PO TABS
5.0000 mg | ORAL_TABLET | Freq: Once | ORAL | Status: AC
  Administered 2015-05-26: 5 mg via ORAL
  Filled 2015-05-26: qty 1

## 2015-05-26 MED ORDER — METOPROLOL TARTRATE 50 MG PO TABS
62.5000 mg | ORAL_TABLET | Freq: Two times a day (BID) | ORAL | Status: DC
  Administered 2015-05-26 – 2015-05-30 (×8): 62.5 mg via ORAL
  Filled 2015-05-26 (×9): qty 1

## 2015-05-26 MED ORDER — POTASSIUM CHLORIDE CRYS ER 20 MEQ PO TBCR
40.0000 meq | EXTENDED_RELEASE_TABLET | Freq: Once | ORAL | Status: AC
  Administered 2015-05-26: 40 meq via ORAL
  Filled 2015-05-26: qty 2

## 2015-05-26 NOTE — Progress Notes (Signed)
ANTICOAGULATION CONSULT NOTE - Initial Consult  Pharmacy Consult for warfarin Indication: Afib  No Known Allergies  Patient Measurements: Height: 5\' 8"  (172.7 cm) Weight: 134 lb 4.2 oz (60.9 kg) IBW/kg (Calculated) : 63.9  Vital Signs: Temp: 97.9 F (36.6 C) (06/19 0700) Temp Source: Oral (06/19 0700) BP: 153/93 mmHg (06/19 0318) Pulse Rate: 110 (06/19 0318)  Labs:  Recent Labs  05/24/15 0400 05/25/15 0730 05/26/15 0231  HGB 10.2* 10.8* 10.8*  HCT 29.3* 30.3* 30.7*  PLT 208 243 255  LABPROT 18.9* 18.7* 18.4*  INR 1.58* 1.56* 1.52*  CREATININE 4.15* 2.82* 2.02*    Estimated Creatinine Clearance: 18.9 mL/min (by C-G formula based on Cr of 2.02).   Medical History: Past Medical History  Diagnosis Date  . OSA (obstructive sleep apnea)     AHI-44/hr, AHI during REM 61.8/hr, avg O2 during REM and NREM 96%  . Atrial fibrillation   . Myocardial infarct   . Diabetes mellitus   . Hearing loss   . Hypertension   . Hyperlipidemia   . Senile degeneration of brain   . Other generalized ischemic cerebrovascular disease   . Personal history of fall   . Pain in joint, shoulder region   . 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 Loss of weight   . Chronic kidney disease, stage II (mild)   . Obstructive sleep apnea (adult) (pediatric)   . Senile degeneration of brain   . Dizziness and giddiness   . Personal history of noncompliance with medical treatment, presenting hazards to health   . Dyskinesia of esophagus   . Shortness of breath   . Chest pain, unspecified 06/19/2009  . Long term (current) use of anticoagulants   . Unspecified hereditary and idiopathic peripheral neuropathy   . Pain in joint, site unspecified   . Carpal tunnel syndrome   . Other hammer toe (acquired)   . Alopecia areata   . Dysphagia, unspecified(787.20)   . Occlusion and stenosis of carotid artery without mention of cerebral infarction   . Other symptoms involving  cardiovascular system   . Other acne   . Allergic rhinitis, cause unspecified   . Type II or unspecified type diabetes mellitus without mention of complication, uncontrolled   . Varicose veins of lower extremities   . Cervicalgia   . Disturbance of salivary secretion   . Insomnia, unspecified   . Unspecified hypothyroidism   . Palpitations 715.90  . Other malaise and fatigue   . Diverticulosis of colon (without mention of hemorrhage)   . Benign neoplasm of colon   . Depressive disorder, not elsewhere classified   . Syncope and collapse   . Paroxysmal atrial fibrillation   . Hypertension   . Dizziness     Medications:  Scheduled:  . antiseptic oral rinse  7 mL Mouth Rinse BID  . cefTRIAXone (ROCEPHIN)  IV  1 g Intravenous Q24H  . feeding supplement (ENSURE ENLIVE)  237 mL Oral BID BM  . insulin aspart  0-5 Units Subcutaneous QHS  . insulin aspart  0-9 Units Subcutaneous TID WC  . levothyroxine  75 mcg Oral QAC breakfast  . metoprolol tartrate  50 mg Oral BID   Infusions:    Assessment: 81 y/o F from home with syncopal episode.  Coumadin PTA for hx afib (INR 6.09>>1.51) - s/p vitamin k sq 10mg  x 2 + FFP x4.  Pharmacy consulted to resume warfarin in the setting of afib.    Home dose: 7.5mg  on  Mon/Wed/Fri, 5mg  all other days  INR SUBtherapeutic at 1.52.  Hgb low but stable, plt wnl.  No reports of bleeding at this time.    Goal of Therapy:  INR 2-3 Monitor platelets by anticoagulation protocol: Yes   Plan:  - Warfarin 5 mg PO x 1 tonight - Monitor CBC and s/sx of bleeding - Daily INR  Hassie Bruce, Pharm. D. Clinical Pharmacy Resident Pager: [phone] Ph: [phone] 05/26/2015 9:26 AM

## 2015-05-26 NOTE — Discharge Instructions (Signed)
Information on my medicine - Coumadin   (Warfarin)  This medication education was reviewed with me or my healthcare representative as part of my discharge preparation.  The pharmacist that spoke with me during my hospital stay was:  Blossom Hoops, [facility]  Why was Coumadin prescribed for you? Coumadin was prescribed for you because you have a blood clot or a medical condition that can cause an increased risk of forming blood clots. Blood clots can cause serious health problems by blocking the flow of blood to the heart, lung, or brain. Coumadin can prevent harmful blood clots from forming. As a reminder your indication for Coumadin is:   Stroke Prevention Because Of Atrial Fibrillation  What test will check on my response to Coumadin? While on Coumadin (warfarin) you will need to have an INR test regularly to ensure that your dose is keeping you in the desired range. The INR (international normalized ratio) number is calculated from the result of the laboratory test called prothrombin time (PT).  If an INR APPOINTMENT HAS NOT ALREADY BEEN MADE FOR YOU please schedule an appointment to have this lab work done by your health care provider within 7 days. Your INR goal is usually a number between:  2 to 3 or your provider may give you a more narrow range like 2-2.5.  Ask your health care provider during an office visit what your goal INR is.  What  do you need to  know  About  COUMADIN? Take Coumadin (warfarin) exactly as prescribed by your healthcare provider about the same time each day.  DO NOT stop taking without talking to the doctor who prescribed the medication.  Stopping without other blood clot prevention medication to take the place of Coumadin may increase your risk of developing a new clot or stroke.  Get refills before you run out.  What do you do if you miss a dose? If you miss a dose, take it as soon as you remember on the same day then continue your regularly scheduled regimen the next  day.  Do not take two doses of Coumadin at the same time.  Important Safety Information A possible side effect of Coumadin (Warfarin) is an increased risk of bleeding. You should call your healthcare provider right away if you experience any of the following: ? Bleeding from an injury or your nose that does not stop. ? Unusual colored urine (red or dark brown) or unusual colored stools (red or black). ? Unusual bruising for unknown reasons. ? A serious fall or if you hit your head (even if there is no bleeding).  Some foods or medicines interact with Coumadin (warfarin) and might alter your response to warfarin. To help avoid this: ? Eat a balanced diet, maintaining a consistent amount of Vitamin K. ? Notify your provider about major diet changes you plan to make. ? Avoid alcohol or limit your intake to 1 drink for women and 2 drinks for men per day. (1 drink is 5 oz. wine, 12 oz. beer, or 1.5 oz. liquor.)  Make sure that ANY health care provider who prescribes medication for you knows that you are taking Coumadin (warfarin).  Also make sure the healthcare provider who is monitoring your Coumadin knows when you have started a new medication including herbals and non-prescription products.  Coumadin (Warfarin)  Major Drug Interactions  Increased Warfarin Effect Decreased Warfarin Effect  Alcohol (large quantities) Antibiotics (esp. Septra/Bactrim, Flagyl, Cipro) Amiodarone (Cordarone) Aspirin (ASA) Cimetidine (Tagamet) Megestrol (Megace) NSAIDs (ibuprofen,  naproxen, etc.) °Piroxicam (Feldene) °Propafenone (Rythmol SR) °Propranolol (Inderal) °Isoniazid (INH) °Posaconazole (Noxafil) Barbiturates (Phenobarbital) °Carbamazepine (Tegretol) °Chlordiazepoxide (Librium) °Cholestyramine (Questran) °Griseofulvin °Oral Contraceptives °Rifampin °Sucralfate (Carafate) °Vitamin K  ° °Coumadin® (Warfarin) Major Herbal Interactions  °Increased Warfarin Effect Decreased Warfarin Effect   °Garlic °Ginseng °Ginkgo biloba Coenzyme Q10 °Green tea °St. John’s wort   ° °Coumadin® (Warfarin) FOOD Interactions  °Eat a consistent number of servings per week of foods HIGH in Vitamin K °(1 serving = ½ cup)  °Collards (cooked, or boiled & drained) °Kale (cooked, or boiled & drained) °Mustard greens (cooked, or boiled & drained) °Parsley *serving size only = ¼ cup °Spinach (cooked, or boiled & drained) °Swiss chard (cooked, or boiled & drained) °Turnip greens (cooked, or boiled & drained)  °Eat a consistent number of servings per week of foods MEDIUM-HIGH in Vitamin K °(1 serving = 1 cup)  °Asparagus (cooked, or boiled & drained) °Broccoli (cooked, boiled & drained, or raw & chopped) °Brussel sprouts (cooked, or boiled & drained) *serving size only = ½ cup °Lettuce, raw (green leaf, endive, romaine) °Spinach, raw °Turnip greens, raw & chopped  ° °These websites have more information on Coumadin (warfarin):  www.coumadin.com; °www.ahrq.gov/consumer/coumadin.htm; ° ° ° °

## 2015-05-26 NOTE — Progress Notes (Signed)
Patient is refusing CPAP for tonight. 

## 2015-05-26 NOTE — Progress Notes (Signed)
Son [name] informed of patient transfer to [phone]

## 2015-05-26 NOTE — Progress Notes (Signed)
[facility] TEAM 1 - Stepdown/ICU TEAM Progress Note  [name]:[id number] DOB: Feb 12, 1925 DOA: 05/20/2015 PCP: Estill Dooms, MD  Admit HPI / Brief Narrative: [name] is a 81 y.o. WF PMHx Depressive DO, CVA, Senile Degeneration of Brain, Syncope and Collapse Paroxysmal Atrial Fibrillation, Hypertension, Hx MI, Diabetes Type 2 uncontrolled, Hereditary and Idiopathic Peripheral Neuropathy.OSA, Diverticulosis,   Brought to the ER after patient's son found the patient was increasingly weak confused and having persistent nausea vomiting and diarrhea. As per patient's son patient has been having some productive cough over the last few weeks and had taken to her PCP and was started on Z-Pak 5 days ago. Following the first dose the next day patient started developing diarrhea with rectal bleeding. Patient eventually started developing nausea and vomiting. It was also noticed that patient has been taking some of her medication more than required after patient's son did pill counts. In the ER patient was found to be coagulopathic and in the ER patient was hypotensive and bradycardic with labs showing markedly elevated creatinine from her baseline with hyperkalemia. Patient also may have taken her metoprolol more than required. ER physician had given glucagon followed by IV bicarbonate and insulin D5W for the hyperkalemia. Patient was given fluid bolus and admitted for further management. Patient's lactic acid was elevated and on call pulmonary critical care was consulted and patient has been admitted for further management of acute renal failure, rectal bleeding, coagulopathy and nausea vomiting and diarrhea. Patient also was given vitamin K 5 mg for coagulopathy.  HPI/Subjective: 6/19 A/O 3 (does not know when), patient follows all commands. However patient easily moves off topic and begins to talk about episodes in her life from the distant past. States lives by herself a couple of blocks from  her daughter. Patient is very anxious to be allowed to ambulate around ward.   Assessment/Plan: Acute renal failure/Hyperkalemia/Metabolic Acidosis - Cr on admission= 7.88. Has significantly improved but is still elevated, current Cr= 2.02 -No further HD anticipated per nephrology -Strict I/O, since admission + 4.3 L  UTI -Empirically treating; to complete a five-day course of antibiotics.  Bradycardia with hypotension  - probably related to medication and also hyperkalemia.  -Currently patient A. fib RVR -See A. fib RVR  Paroxysmal Atrial Fibrillation -Rate poorly controlled -Will attempt to hold off on adding additional agent increase metoprolol 62.5 mg BID -Coumadin per pharmacy; currently INR= 1.52  Hypertension -Patient's BP is now trending up see paroxysmal atrial fibrillation -Hydralazine 5 mg PRN  Rectal bleeding with coagulopathy  - Resolved, watch hemoglobin closely .  - Protonix 40 mg daily  Diarrhea  - C. difficile negative.  Diabetes mellitus type 2 Controlled - 4/21 hemoglobin A1c = 6.4   Hypothyroidism  - Continue Synthroid 75 g daily  -TSH pending  Hypokalemia -K Dur 40 mEq  OSA noncompliant with CPAP -History of memory problems.    Code Status: FULL Family Communication: no family present at time of exam Disposition Plan: Discharge in next 48-72 hrs. SNF? vs Home Health?    Consultants: Dr.Wesam Kathryne Sharper (PCCM) Dr.Ryan B Sanford (nephrology)   Procedure/Significant Events: CT abdomen 6/13 > Bilateral pleural effusions, moderate on the right and small on the left. Ascites. Distended gallbladder with multiple gallstones. Tiny bilateral renal stones. Slight prominence of the mucosa in the rectum and distal sigmoid.colitis? CT head 6/13 > No acute intracranial abnormality. Stable degree of cortical volume loss. 6/14 [place] abd unremarkable 6/15 started on HD  Culture 6/14 MRSA by PCR n 6/14 blood left/right hand NGTD 6/14 C. difficile  by PCR negative    Antibiotics: Abx: Vanc, start date 6/14 >>>6/16 Abx: Zosyn, start date 6/13 >>6/16 Abx: Flagyl, start date 6/14 >>>6/15 Abx: PO Vanc, start date 6/14 >>>6/15 Rocephin 6/16>>>   DVT prophylaxis: Coumadin   Devices    LINES / TUBES:      Continuous Infusions:   Objective: VITAL SIGNS: Temp: 98.4 F (36.9 C) (06/19 1548) Temp Source: Oral (06/19 1548) BP: 140/100 mmHg (06/19 1700) Pulse Rate: 109 (06/19 1700) SPO2; FIO2:   Intake/Output Summary (Last 24 hours) at 05/26/15 1755 Last data filed at 05/26/15 1700  Gross per 24 hour  Intake    220 ml  Output   1050 ml  Net   -830 ml     Exam: General: A/O 3 (does not know when), follows all commands, No acute respiratory distress Eyes: Negative headache, negative retinal hemorrhage ENT: Negative Runny nose, negative gingival bleeding,  Neck:  Negative scars, masses, torticollis, lymphadenopathy, JVD Lungs: Clear to auscultation bilaterally without wheezes or crackles Cardiovascular: Irregular irregular rhythm and rate, without murmur gallop or rub normal S1 and S2 Abdomen:negative abdominal pain, negative dysphagia, Nontender, nondistended, soft, bowel sounds positive, no rebound, no ascites, no appreciable mass Extremities: No significant cyanosis, clubbing, or edema bilateral lower extremities Psychiatric:  Negative depression, negative anxiety, negative fatigue, negative mania  Neurologic:  Cranial nerves II through XII intact, tongue/uvula midline, all extremities muscle strength 5/5, sensation intact throughout, negative dysarthria, negative expressive aphasia, negative receptive aphasia.    Data Reviewed: Basic Metabolic Panel:  Recent Labs Lab 05/22/15 0420 05/23/15 0420 05/24/15 0400 05/25/15 0730 05/26/15 0231  NA 130* 136 134* 134* 137  K 4.7 3.7 3.1* 2.8* 3.2*  CL 97* 95* 96* 97* 102  CO2 17* 23 20* 21* 25  GLUCOSE 166* 146* 172* 135* 109*  BUN 59* 33* 36* 32* 30*    CREATININE 6.63* 4.28* 4.15* 2.82* 2.02*  CALCIUM 7.4* 8.0* 8.3* 8.7* 8.7*  MG  --  1.6* 2.0  --   --   PHOS  --  4.8* 4.1  --   --    Liver Function Tests:  Recent Labs Lab 05/21/15 0420 05/23/15 0420 05/25/15 0730  AST 349* 121* 64*  ALT 192* 141* 105*  ALKPHOS 324* 207* 228*  BILITOT 0.7 0.9 0.9  PROT 5.8* 6.4* 6.2*  ALBUMIN 3.0* 3.2* 3.1*    Recent Labs Lab 05/21/15 0420  LIPASE 49    Recent Labs Lab 05/21/15 0948  AMMONIA 26   CBC:  Recent Labs Lab 05/20/15 2300  05/21/15 1126 05/23/15 0420 05/24/15 0400 05/25/15 0730 05/26/15 0231  WBC 12.7*  < > 11.3* 11.4* 9.4 10.6* 11.1*  NEUTROABS 8.9*  --   --   --   --   --   --   HGB 10.8*  < > 10.3* 10.6* 10.2* 10.8* 10.8*  HCT 30.9*  < > 29.9* 29.8* 29.3* 30.3* 30.7*  MCV 95.1  < > 95.2 91.7 93.6 91.8 93.0  PLT 265  < > 231 243 208 243 255  < > = values in this interval not displayed. Cardiac Enzymes:  Recent Labs Lab 05/20/15 2300 05/21/15 0420  TROPONINI 0.03 0.03   BNP (last 3 results) No results for input(s): BNP in the last 8760 hours.  ProBNP (last 3 results) No results for input(s): PROBNP in the last 8760 hours.  CBG:  Recent Labs Lab 05/25/15  1144 05/25/15 1617 05/25/15 2143 05/26/15 0735 05/26/15 1201  GLUCAP 225* 213* 111* 109* 204*    Recent Results (from the past 240 hour(s))  MRSA PCR Screening     Status: None   Collection Time: 05/21/15  3:00 AM  Result Value Ref Range Status   MRSA by PCR NEGATIVE NEGATIVE Final    Comment:        The GeneXpert MRSA Assay (FDA approved for NASAL specimens only), is one component of a comprehensive MRSA colonization surveillance program. It is not intended to diagnose MRSA infection nor to guide or monitor treatment for MRSA infections.   Culture, blood (x 2)     Status: None (Preliminary result)   Collection Time: 05/21/15  7:35 AM  Result Value Ref Range Status   Specimen Description BLOOD LEFT HAND  Final   Special  Requests BOTTLES DRAWN AEROBIC AND ANAEROBIC 4 CC EACH  Final   Culture   Final           BLOOD CULTURE RECEIVED NO GROWTH TO DATE CULTURE WILL BE HELD FOR 5 DAYS BEFORE ISSUING A FINAL NEGATIVE REPORT Performed at [facility]    Report Status PENDING  Incomplete  Culture, blood (x 2)     Status: None (Preliminary result)   Collection Time: 05/21/15  7:38 AM  Result Value Ref Range Status   Specimen Description BLOOD RIGHT HAND  Final   Special Requests BOTTLES DRAWN AEROBIC ONLY 5CC  Final   Culture   Final           BLOOD CULTURE RECEIVED NO GROWTH TO DATE CULTURE WILL BE HELD FOR 5 DAYS BEFORE ISSUING A FINAL NEGATIVE REPORT Performed at [facility]    Report Status PENDING  Incomplete  Clostridium Difficile by PCR (not at [facility])     Status: None   Collection Time: 05/21/15 12:00 PM  Result Value Ref Range Status   C difficile by pcr NEGATIVE NEGATIVE Final     Studies:  Recent x-ray studies have been reviewed in detail by the Attending Physician  Scheduled Meds:  Scheduled Meds: . antiseptic oral rinse  7 mL Mouth Rinse BID  . cefTRIAXone (ROCEPHIN)  IV  1 g Intravenous Q24H  . feeding supplement (ENSURE ENLIVE)  237 mL Oral BID BM  . insulin aspart  0-5 Units Subcutaneous QHS  . insulin aspart  0-9 Units Subcutaneous TID WC  . levothyroxine  75 mcg Oral QAC breakfast  . metoprolol tartrate  62.5 mg Oral BID  . pantoprazole  40 mg Oral Daily  . warfarin  5 mg Oral ONCE-1800    Time spent on care of this patient: 40 mins   Renleigh Ouellet, Geraldo Docker , MD  [facility] Office  [phone] Pager [phone]  On-Call/Text Page:      [email or web address]      password [email or web address]  If 7PM-7AM, please contact night-coverage [email or web address] Password [email or web address] 05/26/2015, 5:55 PM   LOS: 5 days   Care during the described time interval was provided by me .  I have reviewed this patient's available data, including medical history, events of note, physical examination, and  all test results as part of my evaluation. I have personally reviewed and interpreted all radiology studies.   Dia Crawford, MD [phone] Pager

## 2015-05-26 NOTE — Progress Notes (Signed)
eLink Physician-Brief Progress Note Patient Name: [name] DOB: Jan 17, 1925 MRN: [id number]   Date of Service  05/26/2015  HPI/Events of Note  Hypokalemia  eICU Interventions  Potassium replaced     Intervention Category Intermediate Interventions: Electrolyte abnormality - evaluation and management  Jameson Morrow 05/26/2015, 3:50 AM

## 2015-05-27 LAB — GLUCOSE, CAPILLARY
GLUCOSE-CAPILLARY: 128 mg/dL — AB (ref 65–99)
GLUCOSE-CAPILLARY: 99 mg/dL (ref 65–99)
Glucose-Capillary: 130 mg/dL — ABNORMAL HIGH (ref 65–99)
Glucose-Capillary: 235 mg/dL — ABNORMAL HIGH (ref 65–99)

## 2015-05-27 LAB — CBC
HCT: 30.6 % — ABNORMAL LOW (ref 36.0–46.0)
HEMOGLOBIN: 10.6 g/dL — AB (ref 12.0–15.0)
MCH: 32.5 pg (ref 26.0–34.0)
MCHC: 34.6 g/dL (ref 30.0–36.0)
MCV: 93.9 fL (ref 78.0–100.0)
Platelets: 261 10*3/uL (ref 150–400)
RBC: 3.26 MIL/uL — AB (ref 3.87–5.11)
RDW: 12.9 % (ref 11.5–15.5)
WBC: 11.7 10*3/uL — ABNORMAL HIGH (ref 4.0–10.5)

## 2015-05-27 LAB — BASIC METABOLIC PANEL
ANION GAP: 11 (ref 5–15)
BUN: 29 mg/dL — ABNORMAL HIGH (ref 6–20)
CALCIUM: 8.6 mg/dL — AB (ref 8.9–10.3)
CO2: 24 mmol/L (ref 22–32)
Chloride: 101 mmol/L (ref 101–111)
Creatinine, Ser: 1.62 mg/dL — ABNORMAL HIGH (ref 0.44–1.00)
GFR calc non Af Amer: 27 mL/min — ABNORMAL LOW (ref >60)
GFR, EST AFRICAN AMERICAN: 32 mL/min — AB (ref >60)
Glucose, Bld: 136 mg/dL — ABNORMAL HIGH (ref 65–99)
Potassium: 3.4 mmol/L — ABNORMAL LOW (ref 3.5–5.1)
Sodium: 136 mmol/L (ref 135–145)

## 2015-05-27 LAB — CULTURE, BLOOD (ROUTINE X 2)
CULTURE: NO GROWTH
Culture: NO GROWTH

## 2015-05-27 LAB — PROTIME-INR
INR: 1.42 (ref 0.00–1.49)
Prothrombin Time: 17.4 seconds — ABNORMAL HIGH (ref 11.6–15.2)

## 2015-05-27 LAB — TSH: TSH: 6.047 u[IU]/mL — AB (ref 0.350–4.500)

## 2015-05-27 MED ORDER — DILTIAZEM HCL 30 MG PO TABS
30.0000 mg | ORAL_TABLET | Freq: Three times a day (TID) | ORAL | Status: DC
  Administered 2015-05-27 – 2015-05-29 (×6): 30 mg via ORAL
  Filled 2015-05-27 (×9): qty 1

## 2015-05-27 MED ORDER — WARFARIN SODIUM 7.5 MG PO TABS
7.5000 mg | ORAL_TABLET | Freq: Once | ORAL | Status: AC
  Administered 2015-05-27: 7.5 mg via ORAL
  Filled 2015-05-27: qty 1

## 2015-05-27 MED ORDER — METOPROLOL TARTRATE 1 MG/ML IV SOLN
5.0000 mg | Freq: Once | INTRAVENOUS | Status: AC
  Administered 2015-05-27: 5 mg via INTRAVENOUS
  Filled 2015-05-27: qty 5

## 2015-05-27 MED ORDER — POTASSIUM CHLORIDE CRYS ER 20 MEQ PO TBCR
40.0000 meq | EXTENDED_RELEASE_TABLET | Freq: Once | ORAL | Status: AC
  Administered 2015-05-27: 40 meq via ORAL
  Filled 2015-05-27: qty 2

## 2015-05-27 MED ORDER — WARFARIN - PHARMACIST DOSING INPATIENT: Freq: Every day | Status: DC

## 2015-05-27 NOTE — Progress Notes (Signed)
Physical Therapy Treatment Patient Details Name: [name] MRN: [id number] DOB: 26-Jan-1925 Today's Date: 05/27/2015    History of Present Illness Adm 05/20/15 acute kidney injury with hyperkalemia, bil pleural effusions, ascites, respiratory distress progressed with intubation 6/15-6/16, afib with RVR. PMHx- recent fall, DM, afib (anticoagulated), HTN, depression    PT Comments    Pt pleasant but confused, unable to recall date, room number or maintain focus on task throughout session. Pt with repeated tangential responses and unable to answer her number of falls in the last year or son's ability to assist at D/C. Pt with lack of problem solving and awareness making her unsafe to be home alone along with impaired balance and activity tolerance. Will continue to follow to maximize strength, safety and function.   Follow Up Recommendations  SNF;Supervision/Assistance - 24 hour     Equipment Recommendations       Recommendations for Other Services       Precautions / Restrictions Precautions Precautions: Fall    Mobility  Bed Mobility Overal bed mobility: Needs Assistance Bed Mobility: Supine to Sit     Supine to sit: Supervision     General bed mobility comments: cues for sequence with use of rail to complete, pt initially reaching out for therapist assist  Transfers Overall transfer level: Needs assistance   Transfers: Sit to/from Stand Sit to Stand: Min guard         General transfer comment: cues for safety with guarding for balance  Ambulation/Gait Ambulation/Gait assistance: Min guard Ambulation Distance (Feet): 200 Feet Assistive device: Rolling walker (2 wheeled) Gait Pattern/deviations: Step-through pattern;Decreased stride length;Narrow base of support;Drifts right/left   Gait velocity interpretation: Below normal speed for age/gender General Gait Details: cues for safety, position in RW and directional cues, pt veering left and running into objects  x 5 despite cues, unable to recall room number with cues   Stairs            Wheelchair Mobility    Modified Rankin (Stroke Patients Only)       Balance Overall balance assessment: Needs assistance   Sitting balance-Leahy Scale: Good       Standing balance-Leahy Scale: Fair                      Cognition Arousal/Alertness: Awake/alert Behavior During Therapy: Flat affect Overall Cognitive Status: No family/caregiver present to determine baseline cognitive functioning   Orientation Level: Disoriented to;Time Current Attention Level: Sustained           General Comments: slow processing and increased time for transfers, tangential responses and conversation throughout    Exercises      General Comments        Pertinent Vitals/Pain Pain Assessment: No/denies pain  HR 114-125 BP 139/90 supine 126/74 sitting 136/82 standing     Home Living                      Prior Function            PT Goals (current goals can now be found in the care plan section) Progress towards PT goals: Progressing toward goals    Frequency       PT Plan Discharge plan needs to be updated    Co-evaluation             End of Session   Activity Tolerance: Patient tolerated treatment well Patient left: in chair;with call bell/phone within Mccall;with chair alarm set  Time: 2761-8485 PT Time Calculation (min) (ACUTE ONLY): 38 min  Charges:  $Gait Training: 23-37 mins $Therapeutic Activity: 8-22 mins                    G Codes:      [name] 06/15/15, 12:44 PM [name], [place]

## 2015-05-27 NOTE — Progress Notes (Signed)
[facility]                                                                              Patient Demographics  [name], is a 81 y.o. female, DOB - 1925-11-11, GYJ:[id number]  Admit date - 05/20/2015   Admitting Physician Rise Patience, MD  Outpatient Primary MD for the patient is GREEN, Viviann Spare, MD  LOS - 6   Chief Complaint  Patient presents with  . Hypotension       Brief HPI   [name] is a 81 y.o. female with Depressive DO, CVA, Senile Degeneration of Brain, Syncope and Collapse Paroxysmal Atrial Fibrillation, Hypertension, Hx MI, Diabetes Type 2 uncontrolled, Hereditary and Idiopathic Peripheral Neuropathy.OSA, Diverticulosis,  Patient was brought to ER after patient's son found the patient was increasingly weak, confused and having persistent nausea vomiting and diarrhea. As per patient's son, patient has been having some productive cough over the last few weeks and had taken to her PCP and was started on Z-Pak 5 days ago. Following the first dose the next day patient started developing diarrhea with rectal bleeding. Patient eventually started developing nausea and vomiting. It was also noticed that patient has been taking some of her medication more than required after patient's son did pill counts. In the ER patient was found to be coagulopathic and in the ER patient was hypotensive and bradycardic with labs showing markedly elevated creatinine from her baseline with hyperkalemia. Patient also may have taken her metoprolol more than required. ER physician had given glucagon followed by IV bicarbonate and insulin D5W for the hyperkalemia. Patient was given fluid bolus and admitted for further management. Patient's lactic acid was elevated and on call pulmonary critical care was consulted and patient was admitted for further management of acute renal failure, rectal bleeding, coagulopathy and nausea vomiting and diarrhea. Patient also was given vitamin  K 5 mg for coagulopathy.    Assessment & Plan    Principal Problem: Acute renal failure/Hyperkalemia/Metabolic Acidosis likely due to dehydration, sepsis and medication related, patient was on ACE inhibitor, metformin and spironolactone at home. Patient had progressive hypoxic respiratory failure, after failing BiPAP, required intubation on 6/15. - Creatinine on admission was 2.63 with metabolic acidosis, lactic acidosis. Renal function has significantly improved to 1.6 today. Nephrology was consulted. Patient underwent hemodialysis for volume removal and acute kidney injury. Creatinine function has progressively improved and hence no further hemodialysis is anticipated per nephrology. Dialysis catheter was removed on 6/18.  Acute hypoxic respiratory failure - Patient was admitted and had progressively hypoxic respiratory failure and failed BiPAP, required intubation on 6/15, extubated on 6/16 -Currently O2 sats 97% on 2 L  UTI -Empirically treating; unfortunately no urine culture was sent, blood culture has been negative so far, patient is a #7 of IV Rocephin, will discontinue antibiotics  Bradycardia with hypotension - probably related to medication and also hyperkalemia, currently tachycardiac.   Paroxysmal Atrial Fibrillation -Rate poorly controlled, metoprolol was increased to 62.5 mg BID, with no significant improvement, may need to add calcium channel blocker for rate control as BP allows -Coumadin per pharmacy  Hypertension -Currently stable,  follow closely, added calcium channel blocker today  Rectal bleeding with coagulopathy  - Resolved, watch hemoglobin closely .  - Protonix 40 mg daily  Diarrhea  - C. difficile negative.  Diabetes mellitus type 2 uncontrolled - 4/21 hemoglobin A1c  6.4  Continue sliding scale insulin  Hypothyroidism - Continue Synthroid 75 g daily, TSH 6.0   Hypokalemia -K Dur 40 mEq replcaed  OSA noncompliant with CPAP -History of  memory problems.  Moderate malnutrition - Continue nutritional supplements  Code Status: Full code  Family Communication: Discussed in detail with the patient, all imaging results, lab results explained to the patient    Disposition Plan: PT evaluation pending, may need skilled nursing facility  Time Spent in minutes  25 minutes  Procedures  Intubation Hemodialysis CT abdomen 6/13 > Bilateral pleural effusions, moderate on the right and small on the left. Ascites. Distended gallbladder with multiple gallstones. Tiny bilateral renal stones. Slight prominence of the mucosa in the rectum and distal sigmoid.colitis? CT head 6/13 > No acute intracranial abnormality. Stable degree of cortical volume loss. 6/14 [place] abd unremarkable 6/15 started on HD   Consults   Nephrology Critical care   DVT Prophylaxis  coumadin  Medications  Scheduled Meds: . antiseptic oral rinse  7 mL Mouth Rinse BID  . feeding supplement (ENSURE ENLIVE)  237 mL Oral BID BM  . insulin aspart  0-5 Units Subcutaneous QHS  . insulin aspart  0-9 Units Subcutaneous TID WC  . levothyroxine  75 mcg Oral QAC breakfast  . metoprolol tartrate  62.5 mg Oral BID  . pantoprazole  40 mg Oral Daily  . warfarin  7.5 mg Oral ONCE-1800  . Warfarin - Pharmacist Dosing Inpatient   Does not apply q1800   Continuous Infusions:  PRN Meds:.hydrALAZINE, metoprolol, [DISCONTINUED] ondansetron **OR** ondansetron (ZOFRAN) IV   Antibiotics   Anti-infectives    Start     Dose/Rate Route Frequency Ordered Stop   05/23/15 1130  cefTRIAXone (ROCEPHIN) 1 g in dextrose 5 % 50 mL IVPB - Premix  Status:  Discontinued     1 g 100 mL/hr over 30 Minutes Intravenous Every 24 hours 05/23/15 1044 05/27/15 0911   05/22/15 2115  vancomycin (VANCOCIN) 750 mg in sodium chloride 0.9 % 150 mL IVPB     750 mg 150 mL/hr over 60 Minutes Intravenous  Once 05/22/15 2113 05/22/15 2315   05/21/15 1430  vancomycin (VANCOCIN) IVPB 750 mg/150 ml premix   Status:  Discontinued     750 mg 150 mL/hr over 60 Minutes Intravenous Every 48 hours 05/21/15 1425 05/22/15 0959   05/21/15 1200  vancomycin (VANCOCIN) 50 mg/mL oral solution 125 mg  Status:  Discontinued     125 mg Oral 4 times per day 05/21/15 1048 05/22/15 1031   05/21/15 1100  metroNIDAZOLE (FLAGYL) IVPB 500 mg  Status:  Discontinued     500 mg 100 mL/hr over 60 Minutes Intravenous Every 8 hours 05/21/15 1048 05/22/15 1031   05/21/15 0545  vancomycin (VANCOCIN) IVPB 750 mg/150 ml premix  Status:  Discontinued     750 mg 150 mL/hr over 60 Minutes Intravenous Every 48 hours 05/21/15 0543 05/21/15 0728   05/21/15 0545  piperacillin-tazobactam (ZOSYN) IVPB 2.25 g  Status:  Discontinued     2.25 g 100 mL/hr over 30 Minutes Intravenous 3 times per day 05/21/15 0543 05/23/15 1044   05/21/15 0430  doxycycline (VIBRAMYCIN) 100 mg in dextrose 5 % 250 mL IVPB  Status:  Discontinued  100 mg 125 mL/hr over 120 Minutes Intravenous 2 times daily 05/21/15 0335 05/21/15 0523        Subjective:   [name] was seen and examined today  Am. Currently alert and oriented 2, still somewhat confused although follows all commands. Patient denies dizziness, chest pain, shortness of breath, abdominal pain, N/V/D/C, new weakness, numbess, tingling. No acute events overnight.    Objective:   Blood pressure 125/79, pulse 117, temperature 98.3 F (36.8 C), temperature source Oral, resp. rate 18, height 5\' 5"  (1.651 m), weight 61.553 kg (135 lb 11.2 oz), SpO2 97 %.  Wt Readings from Last 3 Encounters:  05/27/15 61.553 kg (135 lb 11.2 oz)  04/03/15 57.244 kg (126 lb 3.2 oz)  01/09/15 57.153 kg (126 lb)     Intake/Output Summary (Last 24 hours) at 05/27/15 1031 Last data filed at 05/27/15 0600  Gross per 24 hour  Intake    420 ml  Output    825 ml  Net   -405 ml    Exam  General: Alert and oriented x 3, NAD  HEENT:  PERRLA, EOMI, Anicteric Sclera, mucous membranes moist.   Neck: Supple,  no JVD, no masses  CVS: ireg ireg, tachycardia  Respiratory: Clear to auscultation bilaterally, no wheezing, rales or rhonchi  Abdomen: Soft, nontender, nondistended, + bowel sounds  Ext: no cyanosis clubbing or edema  Neuro: AAOx3, Cr N's II- XII. Strength 5/5 upper and lower extremities bilaterally  Skin: No rashes  Psych: Normal affect and demeanor, alert and oriented x3    Data Review   Micro Results Recent Results (from the past 240 hour(s))  MRSA PCR Screening     Status: None   Collection Time: 05/21/15  3:00 AM  Result Value Ref Range Status   MRSA by PCR NEGATIVE NEGATIVE Final    Comment:        The GeneXpert MRSA Assay (FDA approved for NASAL specimens only), is one component of a comprehensive MRSA colonization surveillance program. It is not intended to diagnose MRSA infection nor to guide or monitor treatment for MRSA infections.   Culture, blood (x 2)     Status: None   Collection Time: 05/21/15  7:35 AM  Result Value Ref Range Status   Specimen Description BLOOD LEFT HAND  Final   Special Requests BOTTLES DRAWN AEROBIC AND ANAEROBIC 4 CC EACH  Final   Culture   Final    NO GROWTH 5 DAYS Performed at [facility]    Report Status 05/27/2015 FINAL  Final  Culture, blood (x 2)     Status: None   Collection Time: 05/21/15  7:38 AM  Result Value Ref Range Status   Specimen Description BLOOD RIGHT HAND  Final   Special Requests BOTTLES DRAWN AEROBIC ONLY 5CC  Final   Culture   Final    NO GROWTH 5 DAYS Performed at [facility]    Report Status 05/27/2015 FINAL  Final  Clostridium Difficile by PCR (not at [facility])     Status: None   Collection Time: 05/21/15 12:00 PM  Result Value Ref Range Status   C difficile by pcr NEGATIVE NEGATIVE Final    Radiology Reports Ct Abdomen Pelvis Wo Contrast  05/21/2015   CLINICAL DATA:  Sepsis.  GI bleed.  Diarrhea.  Renal failure.  EXAM: CT ABDOMEN AND PELVIS WITHOUT CONTRAST  TECHNIQUE:  Multidetector CT imaging of the abdomen and pelvis was performed following the standard protocol without IV contrast.  COMPARISON:  12/15/2003  FINDINGS: There is a moderate right pleural effusion and a small left pleural effusion. There is a small amount of ascites. Hepatic veins are somewhat distended as is the inferior vena cava consistent with elevated right heart pressure.  No focal liver lesions. There are multiple stones in the gallbladder and the gallbladder is quite distended. No dilated bile ducts. Spleen, pancreas, and adrenal glands are normal. There is a single tiny stone in the mid right kidney and there are several tiny stones in the left kidney. No hydronephrosis. There is ascites peripheral to Gerota's fascia bilaterally. There is a tiny hiatal hernia. There are few diverticula in the distal colon but there is no evidence of diverticulitis. The mucosa of the distal sigmoid and rectum appears slightly prominent which could represent proctitis. The mucosa of the remainder of the colon appears normal. Terminal ileum and small bowel are normal.  Extensive calcification in the abdominal aorta and iliac arteries. Foley catheter in the bladder. Previous removal of the uterus and ovaries. No acute osseous abnormality.  IMPRESSION: 1. Bilateral pleural effusions, moderate on the right and small on the left. 2. Ascites. 3. Distended gallbladder with multiple gallstones. 4. Tiny bilateral renal stones. 5. Slight prominence of the mucosa in the rectum and distal sigmoid. This could represent proctitis.   Electronically Signed   By: Lorriane Shire M.D.   On: 05/21/2015 10:33   Ct Head Wo Contrast  05/21/2015   CLINICAL DATA:  Altered mental status  EXAM: CT HEAD WITHOUT CONTRAST  TECHNIQUE: Contiguous axial images were obtained from the base of the skull through the vertex without intravenous contrast.  COMPARISON:  10/13/2012  FINDINGS: Insert motion images were repeated. Mild cortical volume loss noted with  proportional ventricular prominence. Areas of periventricular white matter hypodensity are most compatible with small vessel ischemic change. No acute hemorrhage, infarct, or mass lesion is identified. Orbits and paranasal sinuses are unremarkable. No skull fracture.  IMPRESSION: No acute intracranial abnormality. Stable degree of cortical volume loss.   Electronically Signed   By: Conchita Paris M.D.   On: 05/21/2015 10:01   US Abdomen Complete  05/21/2015   CLINICAL DATA:  Abdominal pain.  Gallstones.  EXAM: ULTRASOUND ABDOMEN COMPLETE  COMPARISON:  CT abdomen and pelvis earlier this same day.  FINDINGS: Gallbladder: Stones are identified within the gallbladder measuring up to 1.5 cm. The gallbladder wall is thickened at 0.7 cm. Sonographer reports negative Murphy's sign.  Common bile duct: Diameter: 0.3 cm.  Liver: No focal lesion identified. Within normal limits in parenchymal echogenicity.  IVC: No abnormality visualized.  Pancreas: Visualized portion unremarkable.  Spleen: Size and appearance within normal limits.  Right Kidney: Length: 10.9 cm. Echogenicity within normal limits. No hydronephrosis visualized. A 0.8 cm hyperechoic lesion in the lower pole of the left kidney cannot be definitively characterized but likely represents a complex cyst.  Left Kidney: Length: 10.9 cm. Echogenicity within normal limits. No mass or hydronephrosis visualized.  Abdominal aorta: No aneurysm visualized.  Other findings: There is a small volume of abdominal ascites. Bilateral pleural effusions are seen, greater on the right.  IMPRESSION: Gallstones without evidence of cholecystitis. Gallbladder wall thickening is likely related to small volume of abdominal ascites.  Bilateral pleural effusions.   Electronically Signed   By: Inge Rise M.D.   On: 05/21/2015 14:04   Dg Chest Port 1 View  05/25/2015   CLINICAL DATA:  Respiratory failure.  EXAM: PORTABLE CHEST - 1 VIEW  COMPARISON:  05/23/2015  FINDINGS: Improved  aeration in the lungs suggest decreased edema and possibly decreased pleural effusions. There continues to be hazy densities at the lung bases with opacification in the retrocardiac space. The nasogastric tube and endotracheal tube have been removed. Right jugular central venous catheter in the SVC region and stable. Negative for a pneumothorax.  IMPRESSION: Improved aeration in the lungs suggests decreasing pulmonary edema and possibly decreasing pleural effusions. There continues to be basilar densities which may represent residual pleural fluid and left basilar consolidation.  Support apparatuses as described.   Electronically Signed   By: Markus Daft M.D.   On: 05/25/2015 09:24   Dg Chest Port 1 View  05/23/2015   CLINICAL DATA:  Respiratory failure.  EXAM: PORTABLE CHEST - 1 VIEW  COMPARISON:  05/22/2015.  FINDINGS: Endotracheal tube, NG tube, right IJ line in stable position. Mediastinum hilar structures are stable. Heart size stable. Bilateral pulmonary infiltrates again noted. Slight interim improvement. Persistent small right pleural effusion. No pneumothorax.  IMPRESSION: 1. Lines and tubes in stable position. 2. Bilateral pulmonary infiltrates remain. There has been slight improvement. Persistent right pleural effusion.   Electronically Signed   By: Marcello Moores  Register   On: 05/23/2015 07:24   Dg Chest Port 1 View  05/22/2015   CLINICAL DATA:  Hypoxia  EXAM: PORTABLE CHEST - 1 VIEW  COMPARISON:  Study obtained earlier in the day.  FINDINGS: Endotracheal tube tip is 3.5 cm above the carina. Nasogastric tube tip and side port are below the diaphragm. Central catheter tip is in the superior cava. No pneumothorax. There is interstitial edema with bilateral effusions. Heart is mildly enlarged with pulmonary venous hypertension. No bone lesions.  IMPRESSION: Tube and catheter positions as described without pneumothorax. Congestive heart failure. New small left effusion compared to earlier in the day. The  degree of edema and right effusion remain stable.   Electronically Signed   By: Lowella Grip III M.D.   On: 05/22/2015 08:32   Dg Chest Port 1 View  05/22/2015   CLINICAL DATA:  Decreased oxygen saturation and shortness of breath  EXAM: PORTABLE CHEST - 1 VIEW  COMPARISON:  May 21, 2015  FINDINGS: Central catheter tip is in the superior cava. No pneumothorax. There is increase in interstitial edema. There is a new right pleural effusion. Heart is mildly enlarged with mild pulmonary venous hypertension. There is atelectatic change superimposed on edema in the left base. No adenopathy.  IMPRESSION: Congestive heart failure with new right effusion increased edema compared to 1 day prior.   Electronically Signed   By: Lowella Grip III M.D.   On: 05/22/2015 07:26   Dg Chest Port 1 View  05/21/2015   CLINICAL DATA:  Followup acute renal failure  EXAM: PORTABLE CHEST - 1 VIEW  COMPARISON:  05/20/2015  FINDINGS: Right IJ central line, tip at the SVC level.  No pneumothorax.  New diffuse interstitial opacity, compatible with edema. Normal heart size and stable aortic tortuosity, accentuated by rotation. Hyperinflation which is stable from previous. No emphysematous changes on 04/25/2014 chest CT.  IMPRESSION: 1. New right IJ central line.  No pneumothorax. 2. New mild pulmonary edema.   Electronically Signed   By: Monte Fantasia M.D.   On: 05/21/2015 15:42   Dg Chest Portable 1 View  05/20/2015   CLINICAL DATA:  Chest pain, hypotension, syncope  EXAM: PORTABLE CHEST - 1 VIEW  COMPARISON:  04/04/2014  FINDINGS: The heart size and mediastinal contours are within normal limits. Both  lungs are clear. The visualized skeletal structures are unremarkable.  IMPRESSION: No active disease.   Electronically Signed   By: Lucienne Capers M.D.   On: 05/20/2015 23:35   Dg Abd Portable 1v  05/22/2015   CLINICAL DATA:  Nasogastric tube placement  EXAM: PORTABLE ABDOMEN - 1 VIEW  COMPARISON:  CT abdomen and pelvis  May 21, 2015  FINDINGS: Nasogastric tube tip and side port are within the stomach. There is no bowel dilatation or air-fluid level suggesting obstruction. No free air is seen on this supine examination. There are phleboliths in the pelvis.  IMPRESSION: Nasogastric tube tip and side port in stomach. Bowel gas pattern unremarkable.   Electronically Signed   By: Lowella Grip III M.D.   On: 05/22/2015 08:33   Dg Abd Portable 1v  05/21/2015   CLINICAL DATA:  Chest and abdominal pain. Shortness of breath. Hypotension.  EXAM: PORTABLE ABDOMEN - 1 VIEW  COMPARISON:  01/07/2011  FINDINGS: The right abdomen and low pelvis are not included within the field of view. Superimposed structures obscure visualization of some of the left upper quadrant. Visualized bowel gas pattern is normal without significant colonic or small bowel distention. No radiopaque stones. Degenerative changes in the spine.  IMPRESSION: Limited study.  No evidence of bowel obstruction.   Electronically Signed   By: Lucienne Capers M.D.   On: 05/21/2015 00:00    CBC  Recent Labs Lab 05/20/15 2300  05/23/15 0420 05/24/15 0400 05/25/15 0730 05/26/15 0231 05/27/15 0650  WBC 12.7*  < > 11.4* 9.4 10.6* 11.1* 11.7*  HGB 10.8*  < > 10.6* 10.2* 10.8* 10.8* 10.6*  HCT 30.9*  < > 29.8* 29.3* 30.3* 30.7* 30.6*  PLT 265  < > 243 208 243 255 261  MCV 95.1  < > 91.7 93.6 91.8 93.0 93.9  MCH 33.2  < > 32.6 32.6 32.7 32.7 32.5  MCHC 35.0  < > 35.6 34.8 35.6 35.2 34.6  RDW 12.8  < > 12.8 12.9 12.7 12.8 12.9  LYMPHSABS 2.4  --   --   --   --   --   --   MONOABS 1.3*  --   --   --   --   --   --   EOSABS 0.0  --   --   --   --   --   --   BASOSABS 0.0  --   --   --   --   --   --   < > = values in this interval not displayed.  Chemistries   Recent Labs Lab 05/21/15 0420  05/23/15 0420 05/24/15 0400 05/25/15 0730 05/26/15 0231 05/27/15 0650  NA 132*  < > 136 134* 134* 137 136  K 5.9*  < > 3.7 3.1* 2.8* 3.2* 3.4*  CL 103  < > 95*  96* 97* 102 101  CO2 9*  < > 23 20* 21* 25 24  GLUCOSE 121*  < > 146* 172* 135* 109* 136*  BUN 69*  < > 33* 36* 32* 30* 29*  CREATININE 7.41*  < > 4.28* 4.15* 2.82* 2.02* 1.62*  CALCIUM 7.6*  < > 8.0* 8.3* 8.7* 8.7* 8.6*  MG  --   --  1.6* 2.0  --   --   --   AST 349*  --  121*  --  64*  --   --   ALT 192*  --  141*  --  105*  --   --  ALKPHOS 324*  --  207*  --  228*  --   --   BILITOT 0.7  --  0.9  --  0.9  --   --   < > = values in this interval not displayed. ------------------------------------------------------------------------------------------------------------------ estimated creatinine clearance is 22 mL/min (by C-G formula based on Cr of 1.62). ------------------------------------------------------------------------------------------------------------------ No results for input(s): HGBA1C in the last 72 hours. ------------------------------------------------------------------------------------------------------------------ No results for input(s): CHOL, HDL, LDLCALC, TRIG, CHOLHDL, LDLDIRECT in the last 72 hours. ------------------------------------------------------------------------------------------------------------------  Recent Labs  05/27/15 0433  TSH 6.047*   ------------------------------------------------------------------------------------------------------------------ No results for input(s): VITAMINB12, FOLATE, FERRITIN, TIBC, IRON, RETICCTPCT in the last 72 hours.  Coagulation profile  Recent Labs Lab 05/23/15 0420 05/24/15 0400 05/25/15 0730 05/26/15 0231 05/27/15 0433  INR 1.51* 1.58* 1.56* 1.52* 1.42    No results for input(s): DDIMER in the last 72 hours.  Cardiac Enzymes  Recent Labs Lab 05/20/15 2300 05/21/15 0420  TROPONINI 0.03 0.03   ------------------------------------------------------------------------------------------------------------------ Invalid input(s): POCBNP   Recent Labs  05/25/15 2143 05/26/15 0735  05/26/15 1201 05/26/15 1758 05/26/15 2131 05/27/15 0610  GLUCAP 111* 109* 204* 219* 159* 128*     RAI,RIPUDEEP M.D. [facility] 05/27/2015, 10:31 AM  Pager: [phone]   Between 7am to 7pm - call Pager - [phone]  After 7pm go to [email or web address] - password [email or web address]  Call night coverage person covering after 7pm

## 2015-05-27 NOTE — Progress Notes (Signed)
Pt. States she does not wear cpap and that the last time she wore it, she did not tolerate it. RT informed pt. To notify if she changes her mind and decides to try it again.

## 2015-05-27 NOTE — Progress Notes (Signed)
ANTICOAGULATION CONSULT NOTE - FOLLOW UP  Pharmacy Consult:  Coumadin Indication: Afib  No Known Allergies  Patient Measurements: Height: 5\' 5"  (165.1 cm) Weight: 135 lb 11.2 oz (61.553 kg) IBW/kg (Calculated) : 57  Vital Signs: Temp: 98.3 F (36.8 C) (06/20 0505) Temp Source: Oral (06/20 0505) BP: 135/91 mmHg (06/20 0821) Pulse Rate: 113 (06/20 0821)  Labs:  Recent Labs  05/25/15 0730 05/26/15 0231 05/27/15 0433 05/27/15 0650  HGB 10.8* 10.8*  --  10.6*  HCT 30.3* 30.7*  --  30.6*  PLT 243 255  --  261  LABPROT 18.7* 18.4* 17.4*  --   INR 1.56* 1.52* 1.42  --   CREATININE 2.82* 2.02*  --  1.62*    Estimated Creatinine Clearance: 22 mL/min (by C-G formula based on Cr of 1.62).     Assessment: 62 YOF continues on Coumadin from PTA for history of Afib.  Patient's INR was supra-therapeutic on admit and reversed with Vitamin K SQ 10mg  x 2 and FFP x4.  INR currently sub-therapeutic at 1.42 and expect it to take longer to reach therapeutic level again given Vitamin K usage.    Goal of Therapy:  INR 2-3    Plan:  - Coumadin 7.5mg  PO today - Daily PT / INR - F/U abx LOT    Meily Glowacki D. Mina Marble, PharmD, BCPS Pager:  [phone] 05/27/2015, 8:38 AM

## 2015-05-27 NOTE — Progress Notes (Signed)
UR COMPLETED  

## 2015-05-27 NOTE — Progress Notes (Signed)
Patient removed IV. MD paged twice. No new orders given at this time. Patient refuses to have IV restarted. Will report off to receiving RN.

## 2015-05-28 ENCOUNTER — Inpatient Hospital Stay (HOSPITAL_COMMUNITY): Payer: Medicare Other

## 2015-05-28 LAB — BASIC METABOLIC PANEL
Anion gap: 11 (ref 5–15)
BUN: 24 mg/dL — AB (ref 6–20)
CALCIUM: 8.8 mg/dL — AB (ref 8.9–10.3)
CHLORIDE: 104 mmol/L (ref 101–111)
CO2: 24 mmol/L (ref 22–32)
Creatinine, Ser: 1.48 mg/dL — ABNORMAL HIGH (ref 0.44–1.00)
GFR calc Af Amer: 35 mL/min — ABNORMAL LOW (ref >60)
GFR calc non Af Amer: 31 mL/min — ABNORMAL LOW (ref >60)
Glucose, Bld: 134 mg/dL — ABNORMAL HIGH (ref 65–99)
Potassium: 3.9 mmol/L (ref 3.5–5.1)
Sodium: 139 mmol/L (ref 135–145)

## 2015-05-28 LAB — CBC
HCT: 31.2 % — ABNORMAL LOW (ref 36.0–46.0)
HEMOGLOBIN: 10.8 g/dL — AB (ref 12.0–15.0)
MCH: 33.2 pg (ref 26.0–34.0)
MCHC: 34.6 g/dL (ref 30.0–36.0)
MCV: 96 fL (ref 78.0–100.0)
PLATELETS: 291 10*3/uL (ref 150–400)
RBC: 3.25 MIL/uL — AB (ref 3.87–5.11)
RDW: 13.1 % (ref 11.5–15.5)
WBC: 13.7 10*3/uL — ABNORMAL HIGH (ref 4.0–10.5)

## 2015-05-28 LAB — PROTIME-INR
INR: 1.51 — ABNORMAL HIGH (ref 0.00–1.49)
Prothrombin Time: 18.3 seconds — ABNORMAL HIGH (ref 11.6–15.2)

## 2015-05-28 LAB — GLUCOSE, CAPILLARY
GLUCOSE-CAPILLARY: 130 mg/dL — AB (ref 65–99)
GLUCOSE-CAPILLARY: 157 mg/dL — AB (ref 65–99)
Glucose-Capillary: 170 mg/dL — ABNORMAL HIGH (ref 65–99)
Glucose-Capillary: 88 mg/dL (ref 65–99)

## 2015-05-28 LAB — BRAIN NATRIURETIC PEPTIDE: B Natriuretic Peptide: 1317.9 pg/mL — ABNORMAL HIGH (ref 0.0–100.0)

## 2015-05-28 MED ORDER — FUROSEMIDE 10 MG/ML IJ SOLN
40.0000 mg | Freq: Every day | INTRAMUSCULAR | Status: DC
  Administered 2015-05-28 – 2015-05-29 (×2): 40 mg via INTRAVENOUS
  Filled 2015-05-28: qty 4

## 2015-05-28 MED ORDER — LEVALBUTEROL HCL 0.63 MG/3ML IN NEBU
0.6300 mg | INHALATION_SOLUTION | Freq: Four times a day (QID) | RESPIRATORY_TRACT | Status: DC
  Filled 2015-05-28: qty 3

## 2015-05-28 MED ORDER — WARFARIN SODIUM 7.5 MG PO TABS
7.5000 mg | ORAL_TABLET | Freq: Once | ORAL | Status: AC
  Administered 2015-05-28: 7.5 mg via ORAL
  Filled 2015-05-28: qty 1

## 2015-05-28 MED ORDER — LEVALBUTEROL HCL 0.63 MG/3ML IN NEBU: 0.6300 mg | INHALATION_SOLUTION | Freq: Four times a day (QID) | RESPIRATORY_TRACT | Status: DC | PRN

## 2015-05-28 MED ORDER — INSULIN ASPART 100 UNIT/ML ~~LOC~~ SOLN
0.0000 [IU] | Freq: Three times a day (TID) | SUBCUTANEOUS | Status: DC
  Administered 2015-05-29: 7 [IU] via SUBCUTANEOUS
  Administered 2015-05-29: 2 [IU] via SUBCUTANEOUS
  Administered 2015-05-30: 1 [IU] via SUBCUTANEOUS

## 2015-05-28 NOTE — Progress Notes (Signed)
TCT patient's son [name] about [facility] choices, [name] is unable to talk at this time about [facility] agencies. CM to follow up in the am; B Pennie Rushing [phone]

## 2015-05-28 NOTE — Progress Notes (Signed)
ANTICOAGULATION CONSULT NOTE - FOLLOW UP  Pharmacy Consult:  Coumadin Indication: Afib  No Known Allergies  Patient Measurements: Height: 5\' 5"  (165.1 cm) Weight: 135 lb 11.2 oz (61.553 kg) IBW/kg (Calculated) : 57  Vital Signs: Temp: 98.4 F (36.9 C) (06/20 2043) Temp Source: Oral (06/20 2043) BP: 138/92 mmHg (06/20 2043) Pulse Rate: 68 (06/20 2043)  Labs:  Recent Labs  05/26/15 0231 05/27/15 0433 05/27/15 0650 05/28/15 0552  HGB 10.8*  --  10.6* 10.8*  HCT 30.7*  --  30.6* 31.2*  PLT 255  --  261 291  LABPROT 18.4* 17.4*  --  18.3*  INR 1.52* 1.42  --  1.51*  CREATININE 2.02*  --  1.62* 1.48*    Estimated Creatinine Clearance: 24.1 mL/min (by C-G formula based on Cr of 1.48).     Assessment: [name] continues on Coumadin from PTA for history of Afib.  Patient's INR was supra-therapeutic on admit and reversed with Vitamin K SQ 10mg  x 2 and FFP x4.  INR currently sub-therapeutic at 1.51 and expect it to take longer to reach therapeutic level again given Vitamin K usage.  No bleeding reported.  Home Coumadin regimen:  5mg  daily except 7.5mg  MWF   Goal of Therapy:  INR 2-3    Plan:  - Repeat Coumadin 7.5mg  PO today - Daily PT / INR     Ajai Terhaar D. Mina Marble, PharmD, BCPS Pager:  [phone] 05/28/2015, 7:57 AM

## 2015-05-28 NOTE — Progress Notes (Addendum)
[facility]                                                                              Patient Demographics  Mackinzie Vuncannon, is a 81 y.o. female, DOB - 05-31-1925, BRA:[id number]  Admit date - 05/20/2015   Admitting Physician Rise Patience, MD  Outpatient Primary MD for the patient is GREEN, Viviann Spare, MD  LOS - 7   Chief Complaint  Patient presents with  . Hypotension       Brief HPI   NAKESHIA WALDECK is a 81 y.o. female with Depressive DO, CVA, Senile Degeneration of Brain, Syncope and Collapse Paroxysmal Atrial Fibrillation, Hypertension, Hx MI, Diabetes Type 2 uncontrolled, Hereditary and Idiopathic Peripheral Neuropathy.OSA, Diverticulosis,  Patient was brought to ER after patient's son found the patient was increasingly weak, confused and having persistent nausea vomiting and diarrhea. As per patient's son, patient has been having some productive cough over the last few weeks and had taken to her PCP and was started on Z-Pak 5 days ago. Following the first dose the next day patient started developing diarrhea with rectal bleeding. Patient eventually started developing nausea and vomiting. It was also noticed that patient has been taking some of her medication more than required after patient's son did pill counts. In the ER patient was found to be coagulopathic and in the ER patient was hypotensive and bradycardic with labs showing markedly elevated creatinine from her baseline with hyperkalemia. Patient also may have taken her metoprolol more than required. ER physician had given glucagon followed by IV bicarbonate and insulin D5W for the hyperkalemia. Patient was given fluid bolus and admitted for further management. Patient's lactic acid was elevated and on call pulmonary critical care was consulted and patient was admitted for further management of acute renal failure, rectal bleeding, coagulopathy and nausea vomiting and diarrhea. Patient also was given vitamin  K 5 mg for coagulopathy.    Assessment & Plan    Principal Problem: Acute renal failure/Hyperkalemia/Metabolic Acidosis likely due to dehydration, sepsis and medication related, patient was on ACE inhibitor, metformin and spironolactone at home. Patient had progressive hypoxic respiratory failure, after failing BiPAP, required intubation on 6/15. - Creatinine on admission was 8.81 with metabolic acidosis, lactic acidosis. Renal function has significantly improved to 1.6 today. Nephrology was consulted. Patient underwent hemodialysis for volume removal and acute kidney injury. Creatinine function has progressively improved and hence no further hemodialysis is anticipated per nephrology. Dialysis catheter was removed on 6/18.  Acute hypoxic respiratory failure: Patient complaining of shortness of breath today, feeling shallow breathing today - Patient was admitted and had progressively hypoxic respiratory failure and failed BiPAP, required intubation on 6/15, extubated on 6/16 - BNP 1317, I/O's net 4.7 L positive, chest x-ray showed persistent left lower lobe consolidation with small bilateral pleural effusions - Placed on IV Lasix 40 mg daily, follow creatinine function  UTI -Empirically treating; unfortunately no urine culture was sent, blood culture has been negative so far, patient received 7 days of IV Rocephin.   Bradycardia with hypotension - probably related to medication   Paroxysmal Atrial Fibrillation -Continue metoprolol, oral Cardizem was  added yesterday titrate for heart rate control less than 100  -Coumadin per pharmacy  Hypertension -Currently stable, follow closely, continue beta blocker, Cardizem  Rectal bleeding with coagulopathy  - Resolved, watch hemoglobin closely .  - Protonix 40 mg daily  Diarrhea  - C. difficile negative.  Diabetes mellitus type 2 uncontrolled - 4/21 hemoglobin A1c  6.4  Continue sliding scale insulin  Hypothyroidism - Continue  Synthroid 75 g daily, TSH 6.0   OSA noncompliant with CPAP -History of memory problems.  Moderate malnutrition - Continue nutritional supplements  Code Status: Full code  Family Communication: Discussed in detail with the patient, all imaging results, lab results explained to the patient . Discussed in detail with patient's son, Mr. Elgie Collard in detail. He requested that they will be taking the patient home and she is ready for discharge, she will have 24/7 supervision, his wife will be staying with her.   Disposition Plan: PT evaluation -> SNF or 24/7 SUPERVISION hopefully next 24-48 hours  Time Spent in minutes  25 minutes  Procedures  Intubation Hemodialysis CT abdomen 6/13 > Bilateral pleural effusions, moderate on the right and small on the left. Ascites. Distended gallbladder with multiple gallstones. Tiny bilateral renal stones. Slight prominence of the mucosa in the rectum and distal sigmoid.colitis? CT head 6/13 > No acute intracranial abnormality. Stable degree of cortical volume loss. 6/14 [place] abd unremarkable 6/15 started on HD   Consults   Nephrology Critical care   DVT Prophylaxis  coumadin  Medications  Scheduled Meds: . antiseptic oral rinse  7 mL Mouth Rinse BID  . diltiazem  30 mg Oral 3 times per day  . feeding supplement (ENSURE ENLIVE)  237 mL Oral BID BM  . furosemide  40 mg Intravenous Daily  . insulin aspart  0-5 Units Subcutaneous QHS  . insulin aspart  0-9 Units Subcutaneous TID WC  . levothyroxine  75 mcg Oral QAC breakfast  . metoprolol tartrate  62.5 mg Oral BID  . pantoprazole  40 mg Oral Daily  . warfarin  7.5 mg Oral ONCE-1800  . Warfarin - Pharmacist Dosing Inpatient   Does not apply q1800   Continuous Infusions:  PRN Meds:.hydrALAZINE, levalbuterol, metoprolol, [DISCONTINUED] ondansetron **OR** ondansetron (ZOFRAN) IV   Antibiotics   Anti-infectives    Start     Dose/Rate Route Frequency Ordered Stop   05/23/15 1130   cefTRIAXone (ROCEPHIN) 1 g in dextrose 5 % 50 mL IVPB - Premix  Status:  Discontinued     1 g 100 mL/hr over 30 Minutes Intravenous Every 24 hours 05/23/15 1044 05/27/15 0911   05/22/15 2115  vancomycin (VANCOCIN) 750 mg in sodium chloride 0.9 % 150 mL IVPB     750 mg 150 mL/hr over 60 Minutes Intravenous  Once 05/22/15 2113 05/22/15 2315   05/21/15 1430  vancomycin (VANCOCIN) IVPB 750 mg/150 ml premix  Status:  Discontinued     750 mg 150 mL/hr over 60 Minutes Intravenous Every 48 hours 05/21/15 1425 05/22/15 0959   05/21/15 1200  vancomycin (VANCOCIN) 50 mg/mL oral solution 125 mg  Status:  Discontinued     125 mg Oral 4 times per day 05/21/15 1048 05/22/15 1031   05/21/15 1100  metroNIDAZOLE (FLAGYL) IVPB 500 mg  Status:  Discontinued     500 mg 100 mL/hr over 60 Minutes Intravenous Every 8 hours 05/21/15 1048 05/22/15 1031   05/21/15 0545  vancomycin (VANCOCIN) IVPB 750 mg/150 ml premix  Status:  Discontinued  750 mg 150 mL/hr over 60 Minutes Intravenous Every 48 hours 05/21/15 0543 05/21/15 0728   05/21/15 0545  piperacillin-tazobactam (ZOSYN) IVPB 2.25 g  Status:  Discontinued     2.25 g 100 mL/hr over 30 Minutes Intravenous 3 times per day 05/21/15 0543 05/23/15 1044   05/21/15 0430  doxycycline (VIBRAMYCIN) 100 mg in dextrose 5 % 250 mL IVPB  Status:  Discontinued     100 mg 125 mL/hr over 120 Minutes Intravenous 2 times daily 05/21/15 0335 05/21/15 0523        Subjective:   Carisma Troupe was seen and examined. Patient complaining of shortness of breath and shallow breathing today. No wheezing or fevers or chills. No chest pain. Denied any abdominal pain, N/V/D/C, new weakness, numbess, tingling. No acute events overnight.    Objective:   Blood pressure 142/87, pulse 111, temperature 98.4 F (36.9 C), temperature source Oral, resp. rate 18, height 5\' 5"  (1.651 m), weight 61.553 kg (135 lb 11.2 oz), SpO2 98 %.  Wt Readings from Last 3 Encounters:  05/27/15 61.553 kg  (135 lb 11.2 oz)  04/03/15 57.244 kg (126 lb 3.2 oz)  01/09/15 57.153 kg (126 lb)     Intake/Output Summary (Last 24 hours) at 05/28/15 1156 Last data filed at 05/28/15 1017  Gross per 24 hour  Intake   1073 ml  Output    671 ml  Net    402 ml    Exam  General: Alert and oriented x 3, NAD  HEENT:  PERRLA, EOMI   Neck: Supple, no JVD, no masses  CVS: ireg ireg, tachycardia  Respiratory: Decreased breath sounds at the bases  Abdomen: Soft, NT, NBS  Ext: no cyanosis clubbing or edema  Neuro: no new deficits  Skin: No rashes  Psych: Normal affect and demeanor, alert and oriented x3    Data Review   Micro Results Recent Results (from the past 240 hour(s))  MRSA PCR Screening     Status: None   Collection Time: 05/21/15  3:00 AM  Result Value Ref Range Status   MRSA by PCR NEGATIVE NEGATIVE Final    Comment:        The GeneXpert MRSA Assay (FDA approved for NASAL specimens only), is one component of a comprehensive MRSA colonization surveillance program. It is not intended to diagnose MRSA infection nor to guide or monitor treatment for MRSA infections.   Culture, blood (x 2)     Status: None   Collection Time: 05/21/15  7:35 AM  Result Value Ref Range Status   Specimen Description BLOOD LEFT HAND  Final   Special Requests BOTTLES DRAWN AEROBIC AND ANAEROBIC 4 CC EACH  Final   Culture   Final    NO GROWTH 5 DAYS Performed at [facility]    Report Status 05/27/2015 FINAL  Final  Culture, blood (x 2)     Status: None   Collection Time: 05/21/15  7:38 AM  Result Value Ref Range Status   Specimen Description BLOOD RIGHT HAND  Final   Special Requests BOTTLES DRAWN AEROBIC ONLY 5CC  Final   Culture   Final    NO GROWTH 5 DAYS Performed at [facility]    Report Status 05/27/2015 FINAL  Final  Clostridium Difficile by PCR (not at [facility])     Status: None   Collection Time: 05/21/15 12:00 PM  Result Value Ref Range Status   C difficile  by pcr NEGATIVE NEGATIVE Final    Radiology  Reports Ct Abdomen Pelvis Wo Contrast  05/21/2015   CLINICAL DATA:  Sepsis.  GI bleed.  Diarrhea.  Renal failure.  EXAM: CT ABDOMEN AND PELVIS WITHOUT CONTRAST  TECHNIQUE: Multidetector CT imaging of the abdomen and pelvis was performed following the standard protocol without IV contrast.  COMPARISON:  12/15/2003  FINDINGS: There is a moderate right pleural effusion and a small left pleural effusion. There is a small amount of ascites. Hepatic veins are somewhat distended as is the inferior vena cava consistent with elevated right heart pressure.  No focal liver lesions. There are multiple stones in the gallbladder and the gallbladder is quite distended. No dilated bile ducts. Spleen, pancreas, and adrenal glands are normal. There is a single tiny stone in the mid right kidney and there are several tiny stones in the left kidney. No hydronephrosis. There is ascites peripheral to Gerota's fascia bilaterally. There is a tiny hiatal hernia. There are few diverticula in the distal colon but there is no evidence of diverticulitis. The mucosa of the distal sigmoid and rectum appears slightly prominent which could represent proctitis. The mucosa of the remainder of the colon appears normal. Terminal ileum and small bowel are normal.  Extensive calcification in the abdominal aorta and iliac arteries. Foley catheter in the bladder. Previous removal of the uterus and ovaries. No acute osseous abnormality.  IMPRESSION: 1. Bilateral pleural effusions, moderate on the right and small on the left. 2. Ascites. 3. Distended gallbladder with multiple gallstones. 4. Tiny bilateral renal stones. 5. Slight prominence of the mucosa in the rectum and distal sigmoid. This could represent proctitis.   Electronically Signed   By: Lorriane Shire M.D.   On: 05/21/2015 10:33   Dg Chest 2 View  05/28/2015   CLINICAL DATA:  Shortness of Breath  EXAM: CHEST  2 VIEW  COMPARISON:  May 25, 2015   FINDINGS: Central catheter has been removed. No pneumothorax. There is stable airspace consolidation in the left lower lobe. There are small pleural effusions bilaterally. There is patchy atelectasis in the right upper lobe, stable. Heart is upper normal in size with pulmonary vascularity within normal limits. No adenopathy. No bone lesions.  IMPRESSION: Persistent left lower lobe consolidation. Small bilateral effusions. Stable atelectasis right upper lobe. No change in cardiac silhouette.   Electronically Signed   By: Lowella Grip III M.D.   On: 05/28/2015 08:57   Ct Head Wo Contrast  05/21/2015   CLINICAL DATA:  Altered mental status  EXAM: CT HEAD WITHOUT CONTRAST  TECHNIQUE: Contiguous axial images were obtained from the base of the skull through the vertex without intravenous contrast.  COMPARISON:  10/13/2012  FINDINGS: Insert motion images were repeated. Mild cortical volume loss noted with proportional ventricular prominence. Areas of periventricular white matter hypodensity are most compatible with small vessel ischemic change. No acute hemorrhage, infarct, or mass lesion is identified. Orbits and paranasal sinuses are unremarkable. No skull fracture.  IMPRESSION: No acute intracranial abnormality. Stable degree of cortical volume loss.   Electronically Signed   By: Conchita Paris M.D.   On: 05/21/2015 10:01   US Abdomen Complete  05/21/2015   CLINICAL DATA:  Abdominal pain.  Gallstones.  EXAM: ULTRASOUND ABDOMEN COMPLETE  COMPARISON:  CT abdomen and pelvis earlier this same day.  FINDINGS: Gallbladder: Stones are identified within the gallbladder measuring up to 1.5 cm. The gallbladder wall is thickened at 0.7 cm. Sonographer reports negative Murphy's sign.  Common bile duct: Diameter: 0.3 cm.  Liver: No focal  lesion identified. Within normal limits in parenchymal echogenicity.  IVC: No abnormality visualized.  Pancreas: Visualized portion unremarkable.  Spleen: Size and appearance within  normal limits.  Right Kidney: Length: 10.9 cm. Echogenicity within normal limits. No hydronephrosis visualized. A 0.8 cm hyperechoic lesion in the lower pole of the left kidney cannot be definitively characterized but likely represents a complex cyst.  Left Kidney: Length: 10.9 cm. Echogenicity within normal limits. No mass or hydronephrosis visualized.  Abdominal aorta: No aneurysm visualized.  Other findings: There is a small volume of abdominal ascites. Bilateral pleural effusions are seen, greater on the right.  IMPRESSION: Gallstones without evidence of cholecystitis. Gallbladder wall thickening is likely related to small volume of abdominal ascites.  Bilateral pleural effusions.   Electronically Signed   By: Inge Rise M.D.   On: 05/21/2015 14:04   Dg Chest Port 1 View  05/25/2015   CLINICAL DATA:  Respiratory failure.  EXAM: PORTABLE CHEST - 1 VIEW  COMPARISON:  05/23/2015  FINDINGS: Improved aeration in the lungs suggest decreased edema and possibly decreased pleural effusions. There continues to be hazy densities at the lung bases with opacification in the retrocardiac space. The nasogastric tube and endotracheal tube have been removed. Right jugular central venous catheter in the SVC region and stable. Negative for a pneumothorax.  IMPRESSION: Improved aeration in the lungs suggests decreasing pulmonary edema and possibly decreasing pleural effusions. There continues to be basilar densities which may represent residual pleural fluid and left basilar consolidation.  Support apparatuses as described.   Electronically Signed   By: Markus Daft M.D.   On: 05/25/2015 09:24   Dg Chest Port 1 View  05/23/2015   CLINICAL DATA:  Respiratory failure.  EXAM: PORTABLE CHEST - 1 VIEW  COMPARISON:  05/22/2015.  FINDINGS: Endotracheal tube, NG tube, right IJ line in stable position. Mediastinum hilar structures are stable. Heart size stable. Bilateral pulmonary infiltrates again noted. Slight interim  improvement. Persistent small right pleural effusion. No pneumothorax.  IMPRESSION: 1. Lines and tubes in stable position. 2. Bilateral pulmonary infiltrates remain. There has been slight improvement. Persistent right pleural effusion.   Electronically Signed   By: Marcello Moores  Register   On: 05/23/2015 07:24   Dg Chest Port 1 View  05/22/2015   CLINICAL DATA:  Hypoxia  EXAM: PORTABLE CHEST - 1 VIEW  COMPARISON:  Study obtained earlier in the day.  FINDINGS: Endotracheal tube tip is 3.5 cm above the carina. Nasogastric tube tip and side port are below the diaphragm. Central catheter tip is in the superior cava. No pneumothorax. There is interstitial edema with bilateral effusions. Heart is mildly enlarged with pulmonary venous hypertension. No bone lesions.  IMPRESSION: Tube and catheter positions as described without pneumothorax. Congestive heart failure. New small left effusion compared to earlier in the day. The degree of edema and right effusion remain stable.   Electronically Signed   By: Lowella Grip III M.D.   On: 05/22/2015 08:32   Dg Chest Port 1 View  05/22/2015   CLINICAL DATA:  Decreased oxygen saturation and shortness of breath  EXAM: PORTABLE CHEST - 1 VIEW  COMPARISON:  May 21, 2015  FINDINGS: Central catheter tip is in the superior cava. No pneumothorax. There is increase in interstitial edema. There is a new right pleural effusion. Heart is mildly enlarged with mild pulmonary venous hypertension. There is atelectatic change superimposed on edema in the left base. No adenopathy.  IMPRESSION: Congestive heart failure with new right effusion increased edema  compared to 1 day prior.   Electronically Signed   By: Lowella Grip III M.D.   On: 05/22/2015 07:26   Dg Chest Port 1 View  05/21/2015   CLINICAL DATA:  Followup acute renal failure  EXAM: PORTABLE CHEST - 1 VIEW  COMPARISON:  05/20/2015  FINDINGS: Right IJ central line, tip at the SVC level.  No pneumothorax.  New diffuse  interstitial opacity, compatible with edema. Normal heart size and stable aortic tortuosity, accentuated by rotation. Hyperinflation which is stable from previous. No emphysematous changes on 04/25/2014 chest CT.  IMPRESSION: 1. New right IJ central line.  No pneumothorax. 2. New mild pulmonary edema.   Electronically Signed   By: Monte Fantasia M.D.   On: 05/21/2015 15:42   Dg Chest Portable 1 View  05/20/2015   CLINICAL DATA:  Chest pain, hypotension, syncope  EXAM: PORTABLE CHEST - 1 VIEW  COMPARISON:  04/04/2014  FINDINGS: The heart size and mediastinal contours are within normal limits. Both lungs are clear. The visualized skeletal structures are unremarkable.  IMPRESSION: No active disease.   Electronically Signed   By: Lucienne Capers M.D.   On: 05/20/2015 23:35   Dg Abd Portable 1v  05/22/2015   CLINICAL DATA:  Nasogastric tube placement  EXAM: PORTABLE ABDOMEN - 1 VIEW  COMPARISON:  CT abdomen and pelvis May 21, 2015  FINDINGS: Nasogastric tube tip and side port are within the stomach. There is no bowel dilatation or air-fluid level suggesting obstruction. No free air is seen on this supine examination. There are phleboliths in the pelvis.  IMPRESSION: Nasogastric tube tip and side port in stomach. Bowel gas pattern unremarkable.   Electronically Signed   By: Lowella Grip III M.D.   On: 05/22/2015 08:33   Dg Abd Portable 1v  05/21/2015   CLINICAL DATA:  Chest and abdominal pain. Shortness of breath. Hypotension.  EXAM: PORTABLE ABDOMEN - 1 VIEW  COMPARISON:  01/07/2011  FINDINGS: The right abdomen and low pelvis are not included within the field of view. Superimposed structures obscure visualization of some of the left upper quadrant. Visualized bowel gas pattern is normal without significant colonic or small bowel distention. No radiopaque stones. Degenerative changes in the spine.  IMPRESSION: Limited study.  No evidence of bowel obstruction.   Electronically Signed   By: Lucienne Capers  M.D.   On: 05/21/2015 00:00    CBC  Recent Labs Lab 05/24/15 0400 05/25/15 0730 05/26/15 0231 05/27/15 0650 05/28/15 0552  WBC 9.4 10.6* 11.1* 11.7* 13.7*  HGB 10.2* 10.8* 10.8* 10.6* 10.8*  HCT 29.3* 30.3* 30.7* 30.6* 31.2*  PLT 208 243 255 261 291  MCV 93.6 91.8 93.0 93.9 96.0  MCH 32.6 32.7 32.7 32.5 33.2  MCHC 34.8 35.6 35.2 34.6 34.6  RDW 12.9 12.7 12.8 12.9 13.1    Chemistries   Recent Labs Lab 05/23/15 0420 05/24/15 0400 05/25/15 0730 05/26/15 0231 05/27/15 0650 05/28/15 0552  NA 136 134* 134* 137 136 139  K 3.7 3.1* 2.8* 3.2* 3.4* 3.9  CL 95* 96* 97* 102 101 104  CO2 23 20* 21* 25 24 24   GLUCOSE 146* 172* 135* 109* 136* 134*  BUN 33* 36* 32* 30* 29* 24*  CREATININE 4.28* 4.15* 2.82* 2.02* 1.62* 1.48*  CALCIUM 8.0* 8.3* 8.7* 8.7* 8.6* 8.8*  MG 1.6* 2.0  --   --   --   --   AST 121*  --  64*  --   --   --  ALT 141*  --  105*  --   --   --   ALKPHOS 207*  --  228*  --   --   --   BILITOT 0.9  --  0.9  --   --   --    ------------------------------------------------------------------------------------------------------------------ estimated creatinine clearance is 24.1 mL/min (by C-G formula based on Cr of 1.48). ------------------------------------------------------------------------------------------------------------------ No results for input(s): HGBA1C in the last 72 hours. ------------------------------------------------------------------------------------------------------------------ No results for input(s): CHOL, HDL, LDLCALC, TRIG, CHOLHDL, LDLDIRECT in the last 72 hours. ------------------------------------------------------------------------------------------------------------------  Recent Labs  05/27/15 0433  TSH 6.047*   ------------------------------------------------------------------------------------------------------------------ No results for input(s): VITAMINB12, FOLATE, FERRITIN, TIBC, IRON, RETICCTPCT in the last 72  hours.  Coagulation profile  Recent Labs Lab 05/24/15 0400 05/25/15 0730 05/26/15 0231 05/27/15 0433 05/28/15 0552  INR 1.58* 1.56* 1.52* 1.42 1.51*    No results for input(s): DDIMER in the last 72 hours.  Cardiac Enzymes No results for input(s): CKMB, TROPONINI, MYOGLOBIN in the last 168 hours.  Invalid input(s): CK ------------------------------------------------------------------------------------------------------------------ Invalid input(s): [place]  05/27/15 0610 05/27/15 1206 05/27/15 1749 05/27/15 2122 05/28/15 0556 05/28/15 1123  GLUCAP 128* 130* 235* 99 130* 157*     Monda Chastain M.D. [facility] 05/28/2015, 11:56 AM  Pager: [phone]   Between 7am to 7pm - call Pager - [phone]  After 7pm go to [email or web address] - password [email or web address]  Call night coverage person covering after 7pm

## 2015-05-28 NOTE — Progress Notes (Signed)
CSW received call from patient's son Melat Wrisley (c) [phone]. He has spoken to his mother and definitely plans to take her home; wants home health for RN and PT.  His mother had verbalized that she did not want these services but son indicated that she due to recent periods of confusion- she does not fully understand the benefits of having home health services. CSW asked son if he had a preference for [facility] agency; he verbalized that he did not have a preference- only wanted to make sure that his mother's insurance was accepted by the [facility].  CSW notified [name]- RNCM of above and will sign off.  Lorie Phenix. Pauline Good, [place]

## 2015-05-28 NOTE — Clinical Social Work Note (Signed)
Clinical Social Work Assessment  Patient Details  Name: [name] MRN: [id number] Date of Birth: August 08, 1925  Date of referral:  05/28/15               Reason for consult:  Facility Placement                Permission sought to share information with:  Family Supports Permission granted to share information::  Yes, Verbal Permission Granted  Name::     [name]  [place]::     Relationship::     Contact Information:     Housing/Transportation Living arrangements for the past 2 months:  [place] of Information:  Patient, Adult Children (Patient is alert and orinted to person and some to situation only at this time) Patient Interpreter Needed:  None Criminal Activity/Legal Involvement Pertinent to Current Situation/Hospitalization:  No - Comment as needed Significant Relationships:  Adult Children Lives with:   Alone Do you feel safe going back to the place where you live?  Yes Need for family participation in patient care:  Yes (Comment)  Care giving concerns: No concerns verbalized by patient who is noted to be alert and oriented to person only; some orientation to situation. Defers to her son [name] for d/c planning decisions. Son presented with SNF recommendation by PT but appeared perplexed by this recommendation.  He stated that his mother was still driving and able to get around at home prior to this hospitalization and that she rarely used her rollator.  After extensive discussion- he was able to verbalize an understanding of a change in her mental with this hospitalization and potential to develop weakness during hospitalization.    Social Worker assessment / plan:  81 year old female referred to [facility] for short term SNF.  Patient was living alone in her home which son reports has been fully constructive to handle all "end of life" care issues (widened doors to accomodate wheelchairs, safety rails etc.  Patient has always verbalized to her  son and other family members that she never wanted to go in a nursing home and he wants to try to honor her wishes.  CSW discussed PT's recommendation for short term SNF for PT.  She is currently walking 200 feet with min guard assist. There are concerns noted abou there living alone. Son reports that he and his wife live within 2 blocks of patient and normally are in and out of the home throughout the day. Patient has been very active in the past- including still driving to the store etc.  Son indicated that his wife and daughter will plan to take turns staying with patient at home and will agree to home health services.  CSW notified RNCM- [name] of above as well as Dr. [name].  CSW will sign off.  Employment status:  Retired, Disabled (Comment on whether or not currently receiving Disability) Insurance information:  Medicare PT Recommendations:  [place] / Referral to community resources:  Other (Comment Required) (RNCM notified- son is willing to accept [place])  Patient/Family's Response to care: Patient is pleasantly confused at this time but states "I'm feeling better."  Son stated that his mother was very sick when she was brought to the hospital and he feels she has improved. Feels that she will "do better" once she returns to her familiar surroundings at home.   Patient/Family's Understanding of and Emotional Response to Diagnosis, Current Treatment, and  Prognosis:  Patient is unable to verbalize any true understanding of her current diagnosis or treatment/prognosis at this time due to her intermittent confusion.  CSW spoke extensively with patient's son who verbalized that his mother had to be hospitalized for "phlem in her lungs".  He was very concerned about this and stated that he still has many questions for the MD. CSW notified Dr. [name] who planned to speak to son about patient's condition.  Son did not have a good understanding of her other medical  issues but seemed somewhat confused with CSW's call indicating that his mother would need an increased level of care.  After extensive discussion- he stated that he would agree to home health services but still plans to take his mother home. He plans to come to the hospital to discuss with his mom and will call CSW if any part of current d/c plan changes.  Otherwise- CSW will sign off.   Emotional Assessment Appearance:  Appears stated age Attitude/Demeanor/Rapport:   (Calm, relaxed, cooperative, mildly confused) Affect (typically observed):  Calm, Quiet Orientation:  Oriented to Self, Oriented to Situation Alcohol / Substance use:  Never Used Psych involvement (Current and /or in the community):  No (Comment)  Discharge Needs  Concerns to be addressed:  Care Coordination Readmission within the last 30 days:  No Current discharge risk:  None Barriers to Discharge:  No Barriers Identified   [name], LCSW 05/28/2015, 1:17 PM

## 2015-05-29 ENCOUNTER — Telehealth: Payer: Self-pay | Admitting: *Deleted

## 2015-05-29 LAB — CBC
HCT: 30.8 % — ABNORMAL LOW (ref 36.0–46.0)
HEMOGLOBIN: 10.6 g/dL — AB (ref 12.0–15.0)
MCH: 33.1 pg (ref 26.0–34.0)
MCHC: 34.4 g/dL (ref 30.0–36.0)
MCV: 96.3 fL (ref 78.0–100.0)
Platelets: 285 10*3/uL (ref 150–400)
RBC: 3.2 MIL/uL — ABNORMAL LOW (ref 3.87–5.11)
RDW: 13.4 % (ref 11.5–15.5)
WBC: 14.2 10*3/uL — ABNORMAL HIGH (ref 4.0–10.5)

## 2015-05-29 LAB — GLUCOSE, CAPILLARY
GLUCOSE-CAPILLARY: 102 mg/dL — AB (ref 65–99)
GLUCOSE-CAPILLARY: 330 mg/dL — AB (ref 65–99)
Glucose-Capillary: 103 mg/dL — ABNORMAL HIGH (ref 65–99)
Glucose-Capillary: 168 mg/dL — ABNORMAL HIGH (ref 65–99)
Glucose-Capillary: 81 mg/dL (ref 65–99)

## 2015-05-29 LAB — BASIC METABOLIC PANEL
ANION GAP: 13 (ref 5–15)
BUN: 23 mg/dL — ABNORMAL HIGH (ref 6–20)
CHLORIDE: 100 mmol/L — AB (ref 101–111)
CO2: 28 mmol/L (ref 22–32)
CREATININE: 1.47 mg/dL — AB (ref 0.44–1.00)
Calcium: 8.8 mg/dL — ABNORMAL LOW (ref 8.9–10.3)
GFR calc non Af Amer: 31 mL/min — ABNORMAL LOW (ref >60)
GFR, EST AFRICAN AMERICAN: 36 mL/min — AB (ref >60)
Glucose, Bld: 100 mg/dL — ABNORMAL HIGH (ref 65–99)
POTASSIUM: 3.8 mmol/L (ref 3.5–5.1)
SODIUM: 141 mmol/L (ref 135–145)

## 2015-05-29 LAB — PROTIME-INR
INR: 1.6 — AB (ref 0.00–1.49)
Prothrombin Time: 19.1 seconds — ABNORMAL HIGH (ref 11.6–15.2)

## 2015-05-29 MED ORDER — DILTIAZEM HCL 60 MG PO TABS
60.0000 mg | ORAL_TABLET | Freq: Three times a day (TID) | ORAL | Status: DC
  Administered 2015-05-29 – 2015-05-30 (×4): 60 mg via ORAL
  Filled 2015-05-29 (×6): qty 1

## 2015-05-29 MED ORDER — FUROSEMIDE 40 MG PO TABS
40.0000 mg | ORAL_TABLET | Freq: Every day | ORAL | Status: DC
  Administered 2015-05-30: 40 mg via ORAL
  Filled 2015-05-29: qty 1

## 2015-05-29 MED ORDER — WARFARIN SODIUM 7.5 MG PO TABS
7.5000 mg | ORAL_TABLET | Freq: Once | ORAL | Status: AC
  Administered 2015-05-29: 7.5 mg via ORAL
  Filled 2015-05-29: qty 1

## 2015-05-29 NOTE — Progress Notes (Signed)
Received message from patient's nurse that the patient's daughter stated the her brother [name] is HCPOA and that her brother [name] should not be making any decisions regarding her healthcare;  TCT [name]- (patient's son) - he does not know anything about his brother [name] being [place], [name] stated that his sister and brother never visit her and he usually makes all of the decision and he is the caregiver for his mother.  TCT [name] ( patient's daughter) [phone]; [name] stated that her brother [name] is the Executor of her estate according to her father's will. CM explained to [name] that HCPOA is a legal document that gives him the right to speak for the patient when she cannot speak for herself and a Lawyer will draw up a legal document that is notarized giving him the ability to make those decisions. CM asked [name] to have the telephone number for [facility] to verify that he is or is not the HCPOA of the patient. [name] stated " well I just talked to him and I don't think that he really wants to be bothered. If I give you his number he will be really mad."  Lots of family issues involved in the case, CM encouraged [name] to talk to his sister [name], he stated "no." [name] stated that he will be in today to talk to the physician and the nursing staff regarding his mother. Regina Slicker RN,BSN,MHA [phone]

## 2015-05-29 NOTE — Progress Notes (Signed)
Nutrition Follow-up  DOCUMENTATION CODES:  Non-severe (moderate) malnutrition in context of chronic illness  INTERVENTION:  Ensure Enlive (each supplement provides 350kcal and 20 grams of protein)  NUTRITION DIAGNOSIS:  Malnutrition related to chronic illness as evidenced by mild depletion of body fat, mild depletion of muscle mass.  Ongoing  GOAL:  Patient will meet greater than or equal to 90% of their needs  Unmet  MONITOR:  PO intake, Supplement acceptance, Labs, Weight trends, I & O's  REASON FOR ASSESSMENT:  Malnutrition Screening Tool, Ventilator    ASSESSMENT: Patient was brought to the ED after son found the patient was increasingly weak, confused, and having persistent nausea and vomiting. Found to be coagulopathic, hypotensive, bradycardic, and with hyperkalemia. Pt was intubated, extubated 6/16.  Pt working with PT at time of visit. Per nursing notes pt is eating 10% to 75% of meals. Her weight has dropped 4 lbs in the past 2 days but, she remains above her reported usual weight. Per order history, pt has been receiving Ensure Enlive nutritional supplements twice daily. RD provided coupons for nutritional supplements at pt's bedside.   Labs: glucose ranging 88 to 135 mg/dL, low GFR, low hemoglobin  Height:  Ht Readings from Last 1 Encounters:  05/26/15 5\' 5"  (1.651 m)    Weight:  Wt Readings from Last 1 Encounters:  05/29/15 131 lb 14.4 oz (59.829 kg)    Ideal Body Weight:  63.6 kg  Wt Readings from Last 10 Encounters:  05/29/15 131 lb 14.4 oz (59.829 kg)  04/03/15 126 lb 3.2 oz (57.244 kg)  01/09/15 126 lb (57.153 kg)  11/20/14 122 lb 3.2 oz (55.43 kg)  07/23/14 123 lb 6.4 oz (55.974 kg)  07/17/14 122 lb 3.2 oz (55.43 kg)  04/18/14 124 lb (56.246 kg)  12/12/13 121 lb 12.8 oz (55.248 kg)  10/26/13 122 lb (55.339 kg)  08/22/13 118 lb (53.524 kg)    BMI:  Body mass index is 21.95 kg/(m^2).  Estimated Nutritional Needs:  Kcal:  1400 -  1600  Protein:  80 - 95 g  Fluid:  >1.5 L  Skin:  Reviewed, no issues  Diet Order:  Diet Carb Modified Fluid consistency:: Thin; Room service appropriate?: Yes  EDUCATION NEEDS:  No education needs identified at this time   Intake/Output Summary (Last 24 hours) at 05/29/15 1054 Last data filed at 05/29/15 0600  Gross per 24 hour  Intake    600 ml  Output   1550 ml  Net   -950 ml    Last BM:  6/20  Pryor Ochoa RD, LDN Inpatient Clinical Dietitian Pager: [phone] After Hours Pager: [phone]

## 2015-05-29 NOTE — Progress Notes (Signed)
Physical Therapy Treatment Patient Details Name: [name] MRN: [id number] DOB: 08-17-1925 Today's Date: 05/29/2015    History of Present Illness Adm 05/20/15 acute kidney injury with hyperkalemia, bil pleural effusions, ascites, respiratory distress progressed with intubation 6/15-6/16, afib with RVR. PMHx- recent fall, DM, afib (anticoagulated), HTN, depression    PT Comments    Pt admitted with above diagnosis. Pt currently with functional limitations due to balance and endurance deficits. Pt able to ambulate with RW with better safety. Pt less confused.  Son has arranged for 24 hour care.  Pt to go home with HHPT.  Pt will benefit from skilled PT to increase their independence and safety with mobility to allow discharge to the venue listed below.    Follow Up Recommendations  Home health PT;Supervision/Assistance - 24 hour (noted daughter in law staying with pt initially. )     Equipment Recommendations  None recommended by PT    Recommendations for Other Services       Precautions / Restrictions Precautions Precautions: Fall Restrictions Weight Bearing Restrictions: No    Mobility  Bed Mobility Overal bed mobility: Needs Assistance Bed Mobility: Supine to Sit     Supine to sit: Independent        Transfers Overall transfer level: Needs assistance Equipment used: None Transfers: Sit to/from Stand Sit to Stand: Supervision            Ambulation/Gait Ambulation/Gait assistance: Supervision Ambulation Distance (Feet): 150 Feet Assistive device: Rolling walker (2 wheeled) Gait Pattern/deviations: Step-through pattern;Decreased stride length   Gait velocity interpretation: Below normal speed for age/gender General Gait Details: Pt having no difficulty with ambulation with RW today.  Ambulated without O2.  Can withstand challenges to balance with RW.    Stairs            Wheelchair Mobility    Modified Rankin (Stroke Patients Only)        Balance Overall balance assessment: Needs assistance   Sitting balance-Leahy Scale: Good     Standing balance support: Bilateral upper extremity supported;During functional activity Standing balance-Leahy Scale: Fair Standing balance comment: can stand statically without UE support for up to 2 min             High level balance activites: Direction changes;Turns High Level Balance Comments: Supervision with use of RW.    Cognition Arousal/Alertness: Awake/alert Behavior During Therapy: Flat affect Overall Cognitive Status: No family/caregiver present to determine baseline cognitive functioning                 General Comments: Better orientation today.  No confusion noted today with good safety today.    Exercises General Exercises - Lower Extremity Long Arc Quad: AROM;Both;10 reps;Seated    General Comments        Pertinent Vitals/Pain Pain Assessment: No/denies pain  Orthostatic BPs  Supine 119/62, 104 bpm  Sitting 129/83, 98 bpm  Standing 134/78, 104 bpm  Standing after 3 min 129/92, 122 bpm   Pt asymptomatic with BPs, 95% on 1L.  90-93% on RA with activty.  Nursing agreed to leave O2 off.      Home Living                      Prior Function            PT Goals (current goals can now be found in the care plan section) Progress towards PT goals: Progressing toward goals    Frequency  Min 3X/week  PT Plan Discharge plan needs to be updated    Co-evaluation             End of Session Equipment Utilized During Treatment: Gait belt Activity Tolerance: Patient tolerated treatment well Patient left: in chair;with call bell/phone within reach;with chair alarm set     Time: 1113-1140 PT Time Calculation (min) (ACUTE ONLY): 27 min  Charges:  $Gait Training: 8-22 mins $Therapeutic Exercise: 8-22 mins                    G Codes:      Zeba Luby F June 21, 2015, 2:28 PM Katlin Bortner,PT Acute  Rehabilitation [phone] (pager)

## 2015-05-29 NOTE — Progress Notes (Signed)
CSW (Clinical Education officer, museum) aware of pt daughter visit this morning. Per advance directive documentation in chart, pt did make her son Margaretmary Lomb[place] in 2003. Silva Bandy listed as secondary. CSW unable to determine at this time who Silva Bandy is in relation to pt. CSW left two voicemails for pt daughter Shauna Hugh to get phone number for pt son Patrick Jupiter. CSW reviewed pt chart from previous encounters and unable to obtain further family contact information. CSW also reached out to pt PCP but they only pt son David's contact information. Will continue to try and find contact information for pt son [place].  [place], [place]

## 2015-05-29 NOTE — Progress Notes (Signed)
ANTICOAGULATION CONSULT NOTE - FOLLOW UP  Pharmacy Consult:  Coumadin Indication: Afib  No Known Allergies  Patient Measurements: Height: 5\' 5"  (165.1 cm) Weight: 131 lb 14.4 oz (59.829 kg) IBW/kg (Calculated) : 57  Vital Signs: Temp: 98.2 F (36.8 C) (06/22 0509) Temp Source: Oral (06/22 0509) BP: 130/74 mmHg (06/22 0509) Pulse Rate: 107 (06/22 0509)  Labs:  Recent Labs  05/27/15 0433  05/27/15 0650 05/28/15 0552 05/29/15 0430  HGB  --   < > 10.6* 10.8* 10.6*  HCT  --   --  30.6* 31.2* 30.8*  PLT  --   --  261 291 285  LABPROT 17.4*  --   --  18.3* 19.1*  INR 1.42  --   --  1.51* 1.60*  CREATININE  --   --  1.62* 1.48* 1.47*  < > = values in this interval not displayed.  Estimated Creatinine Clearance: 24.3 mL/min (by C-G formula based on Cr of 1.47).     Assessment: [name] continues on Coumadin from PTA for history of Afib.  Patient's INR was supra-therapeutic on admit and reversed with Vitamin K SQ 10mg  x 2 and FFP x4.  INR trending up slowly.  Expect a slower response post Vitamin K administration and being cautious as patient's home dose resulted in an elevated INR on admit.  Home Coumadin regimen:  5mg  daily except 7.5mg  MWF   Goal of Therapy:  INR 2-3    Plan:  - Repeat Coumadin 7.5mg  PO today - Daily PT / INR  - Watch WBC trend   Taylormarie Register D. Mina Marble, PharmD, BCPS Pager:  [phone] 05/29/2015, 8:15 AM

## 2015-05-29 NOTE — Progress Notes (Signed)
Pt states she does not wear cpap at home and doesn't want one here, encouraged her to call if she changed her mind

## 2015-05-29 NOTE — Progress Notes (Signed)
CM talked to [name] again, he is the patient's caregiver and will be making decision on the patient's behalf. [facility] choices offered, [name] chose [facility] for HHRN/ PT/ OT/ nurses aide; Butch Penny with [facility] made aware; Mindi Slicker RN,BSN,MHA [phone]

## 2015-05-29 NOTE — Progress Notes (Signed)
CSW (Clinical Education officer, museum) spoke with pt son Patrick Jupiter [phone]) and he confirmed that in 2003 pt did do paperwork to designate him HCPOA. However, Patrick Jupiter did ask pt to change this but unsure if this happened. Pt other chilren also unsure if new paperwork was done. Per Patrick Jupiter, he is agreeable to pt son [name] making all decisions since he is pt primary caregiver. CSW notified [name] of conversation. Pt son [name] aware that HCPOA paperwork needs to be redone if pt wishes have changed. He is also aware that this cannot happen while pt is confused. CSW to provide Advance Directives booklet for review and will be completed with pt if/when confusion clears.  [place], [place]

## 2015-05-29 NOTE — Progress Notes (Signed)
[facility]                                                                              Patient Demographics  [name], is a 81 y.o. female, DOB - 10/10/1925, PJK:[id number]  Admit date - 05/20/2015   Admitting Physician Rise Patience, MD  Outpatient Primary MD for the patient is GREEN, Viviann Spare, MD  LOS - 8   Chief Complaint  Patient presents with  . Hypotension      Subjective: Patient is confused, seen with no red bedside. No major complaints, no fever or chills.  Brief HPI   [name] is a 81 y.o. female with Depressive DO, CVA, Senile Degeneration of Brain, Syncope and Collapse Paroxysmal Atrial Fibrillation, Hypertension, Hx MI, Diabetes Type 2 uncontrolled, Hereditary and Idiopathic Peripheral Neuropathy.OSA, Diverticulosis,  Patient was brought to ER after patient's son found the patient was increasingly weak, confused and having persistent nausea vomiting and diarrhea. As per patient's son, patient has been having some productive cough over the last few weeks and had taken to her PCP and was started on Z-Pak 5 days ago. Following the first dose the next day patient started developing diarrhea with rectal bleeding. Patient eventually started developing nausea and vomiting. It was also noticed that patient has been taking some of her medication more than required after patient's son did pill counts. In the ER patient was found to be coagulopathic and in the ER patient was hypotensive and bradycardic with labs showing markedly elevated creatinine from her baseline with hyperkalemia. Patient also may have taken her metoprolol more than required. ER physician had given glucagon followed by IV bicarbonate and insulin D5W for the hyperkalemia. Patient was given fluid bolus and admitted for further management. Patient's lactic acid was elevated and on call pulmonary critical care was consulted and patient was admitted for further management of acute renal  failure, rectal bleeding, coagulopathy and nausea vomiting and diarrhea. Patient also was given vitamin K 5 mg for coagulopathy.    Assessment & Plan    Acute renal failure/Hyperkalemia/Metabolic Acidosis likely due to dehydration, sepsis and medication related, patient was on ACE inhibitor, metformin and spironolactone at home. Patient had progressive hypoxic respiratory failure, after failing BiPAP, required intubation on 6/15. - Creatinine on admission was 8.09 with metabolic acidosis, lactic acidosis.  -Patient underwent hemodialysis for volume removal and acute kidney injury.  -Creatinine function has progressively improved and hence no further hemodialysis is anticipated per nephrology.  -Dialysis catheter was removed on 6/18. -Renal function has significantly improved to 1.4.  Acute hypoxic respiratory failure: Patient complaining of shortness of breath today, feeling shallow breathing today - Patient was admitted and had progressively hypoxic respiratory failure and failed BiPAP, required intubation on 6/15, extubated on 6/16 - BNP 1317, I/O's net 4.7 L positive, chest x-ray showed persistent left lower lobe consolidation with small bilateral pleural effusions - Placed on IV Lasix 40 mg daily, follow creatinine function  UTI -Empirically treating; unfortunately no urine culture was sent, blood culture has been negative so far, patient received 7 days of IV Rocephin.   Bradycardia with hypotension - probably  related to medication   Paroxysmal Atrial Fibrillation -Continue metoprolol, oral Cardizem was added yesterday titrate for heart rate control less than 100  -Coumadin per pharmacy  Hypertension -Currently stable, follow closely, continue beta blocker, Cardizem  Rectal bleeding with coagulopathy  - Resolved, watch hemoglobin closely .  - Protonix 40 mg daily  Diarrhea  - C. difficile negative.  Diabetes mellitus type 2 uncontrolled - 4/21 hemoglobin A1c  6.4    Continue sliding scale insulin  Hypothyroidism - Continue Synthroid 75 g daily, TSH 6.0   OSA noncompliant with CPAP -History of memory problems.  Moderate malnutrition - Continue nutritional supplements  Code Status: Full code  Family Communication: Discussed in detail with the patient, all imaging results, lab results explained to the patient . Discussed in detail with patient's son, Mr. Elgie Collard in detail. He requested that they will be taking the patient home and she is ready for discharge, she will have 24/7 supervision, his wife will be staying with her.   Disposition Plan: PT evaluation -> SNF or 24/7 SUPERVISION hopefully next 24-48 hours  Time Spent in minutes  25 minutes  Procedures  Intubation Hemodialysis CT abdomen 6/13 > Bilateral pleural effusions, moderate on the right and small on the left. Ascites. Distended gallbladder with multiple gallstones. Tiny bilateral renal stones. Slight prominence of the mucosa in the rectum and distal sigmoid.colitis? CT head 6/13 > No acute intracranial abnormality. Stable degree of cortical volume loss. 6/14 [place] abd unremarkable 6/15 started on HD   Consults   Nephrology Critical care   DVT Prophylaxis  coumadin  Medications  Scheduled Meds: . antiseptic oral rinse  7 mL Mouth Rinse BID  . diltiazem  30 mg Oral 3 times per day  . feeding supplement (ENSURE ENLIVE)  237 mL Oral BID BM  . furosemide  40 mg Intravenous Daily  . insulin aspart  0-5 Units Subcutaneous QHS  . insulin aspart  0-9 Units Subcutaneous TID WC  . levothyroxine  75 mcg Oral QAC breakfast  . metoprolol tartrate  62.5 mg Oral BID  . pantoprazole  40 mg Oral Daily  . warfarin  7.5 mg Oral ONCE-1800  . Warfarin - Pharmacist Dosing Inpatient   Does not apply q1800   Continuous Infusions:  PRN Meds:.hydrALAZINE, levalbuterol, metoprolol, [DISCONTINUED] ondansetron **OR** ondansetron (ZOFRAN) IV   Antibiotics   Anti-infectives    Start      Dose/Rate Route Frequency Ordered Stop   05/23/15 1130  cefTRIAXone (ROCEPHIN) 1 g in dextrose 5 % 50 mL IVPB - Premix  Status:  Discontinued     1 g 100 mL/hr over 30 Minutes Intravenous Every 24 hours 05/23/15 1044 05/27/15 0911   05/22/15 2115  vancomycin (VANCOCIN) 750 mg in sodium chloride 0.9 % 150 mL IVPB     750 mg 150 mL/hr over 60 Minutes Intravenous  Once 05/22/15 2113 05/22/15 2315   05/21/15 1430  vancomycin (VANCOCIN) IVPB 750 mg/150 ml premix  Status:  Discontinued     750 mg 150 mL/hr over 60 Minutes Intravenous Every 48 hours 05/21/15 1425 05/22/15 0959   05/21/15 1200  vancomycin (VANCOCIN) 50 mg/mL oral solution 125 mg  Status:  Discontinued     125 mg Oral 4 times per day 05/21/15 1048 05/22/15 1031   05/21/15 1100  metroNIDAZOLE (FLAGYL) IVPB 500 mg  Status:  Discontinued     500 mg 100 mL/hr over 60 Minutes Intravenous Every 8 hours 05/21/15 1048 05/22/15 1031   05/21/15 0545  vancomycin (VANCOCIN) IVPB 750 mg/150 ml premix  Status:  Discontinued     750 mg 150 mL/hr over 60 Minutes Intravenous Every 48 hours 05/21/15 0543 05/21/15 0728   05/21/15 0545  piperacillin-tazobactam (ZOSYN) IVPB 2.25 g  Status:  Discontinued     2.25 g 100 mL/hr over 30 Minutes Intravenous 3 times per day 05/21/15 0543 05/23/15 1044   05/21/15 0430  doxycycline (VIBRAMYCIN) 100 mg in dextrose 5 % 250 mL IVPB  Status:  Discontinued     100 mg 125 mL/hr over 120 Minutes Intravenous 2 times daily 05/21/15 0335 05/21/15 0523        Subjective:   Susannah Carbin was seen and examined. Patient complaining of shortness of breath and shallow breathing today. No wheezing or fevers or chills. No chest pain. Denied any abdominal pain, N/V/D/C, new weakness, numbess, tingling. No acute events overnight.    Objective:   Blood pressure 140/85, pulse 112, temperature 98.3 F (36.8 C), temperature source Oral, resp. rate 18, height 5\' 5"  (1.651 m), weight 59.829 kg (131 lb 14.4 oz), SpO2 98 %.  Wt  Readings from Last 3 Encounters:  05/29/15 59.829 kg (131 lb 14.4 oz)  04/03/15 57.244 kg (126 lb 3.2 oz)  01/09/15 57.153 kg (126 lb)     Intake/Output Summary (Last 24 hours) at 05/29/15 1330 Last data filed at 05/29/15 1300  Gross per 24 hour  Intake    960 ml  Output   2200 ml  Net  -1240 ml    Exam  General: Alert and oriented x 3, NAD  HEENT:  PERRLA, EOMI   Neck: Supple, no JVD, no masses  CVS: ireg ireg, tachycardia  Respiratory: Decreased breath sounds at the bases  Abdomen: Soft, NT, NBS  Ext: no cyanosis clubbing or edema  Neuro: no new deficits  Skin: No rashes  Psych: Normal affect and demeanor, alert and oriented x3    Data Review   Micro Results Recent Results (from the past 240 hour(s))  MRSA PCR Screening     Status: None   Collection Time: 05/21/15  3:00 AM  Result Value Ref Range Status   MRSA by PCR NEGATIVE NEGATIVE Final    Comment:        The GeneXpert MRSA Assay (FDA approved for NASAL specimens only), is one component of a comprehensive MRSA colonization surveillance program. It is not intended to diagnose MRSA infection nor to guide or monitor treatment for MRSA infections.   Culture, blood (x 2)     Status: None   Collection Time: 05/21/15  7:35 AM  Result Value Ref Range Status   Specimen Description BLOOD LEFT HAND  Final   Special Requests BOTTLES DRAWN AEROBIC AND ANAEROBIC 4 CC EACH  Final   Culture   Final    NO GROWTH 5 DAYS Performed at [facility]    Report Status 05/27/2015 FINAL  Final  Culture, blood (x 2)     Status: None   Collection Time: 05/21/15  7:38 AM  Result Value Ref Range Status   Specimen Description BLOOD RIGHT HAND  Final   Special Requests BOTTLES DRAWN AEROBIC ONLY 5CC  Final   Culture   Final    NO GROWTH 5 DAYS Performed at [facility]    Report Status 05/27/2015 FINAL  Final  Clostridium Difficile by PCR (not at [facility])     Status: None   Collection Time: 05/21/15  12:00 PM  Result Value Ref Range Status  C difficile by pcr NEGATIVE NEGATIVE Final    Radiology Reports Ct Abdomen Pelvis Wo Contrast  05/21/2015   CLINICAL DATA:  Sepsis.  GI bleed.  Diarrhea.  Renal failure.  EXAM: CT ABDOMEN AND PELVIS WITHOUT CONTRAST  TECHNIQUE: Multidetector CT imaging of the abdomen and pelvis was performed following the standard protocol without IV contrast.  COMPARISON:  12/15/2003  FINDINGS: There is a moderate right pleural effusion and a small left pleural effusion. There is a small amount of ascites. Hepatic veins are somewhat distended as is the inferior vena cava consistent with elevated right heart pressure.  No focal liver lesions. There are multiple stones in the gallbladder and the gallbladder is quite distended. No dilated bile ducts. Spleen, pancreas, and adrenal glands are normal. There is a single tiny stone in the mid right kidney and there are several tiny stones in the left kidney. No hydronephrosis. There is ascites peripheral to Gerota's fascia bilaterally. There is a tiny hiatal hernia. There are few diverticula in the distal colon but there is no evidence of diverticulitis. The mucosa of the distal sigmoid and rectum appears slightly prominent which could represent proctitis. The mucosa of the remainder of the colon appears normal. Terminal ileum and small bowel are normal.  Extensive calcification in the abdominal aorta and iliac arteries. Foley catheter in the bladder. Previous removal of the uterus and ovaries. No acute osseous abnormality.  IMPRESSION: 1. Bilateral pleural effusions, moderate on the right and small on the left. 2. Ascites. 3. Distended gallbladder with multiple gallstones. 4. Tiny bilateral renal stones. 5. Slight prominence of the mucosa in the rectum and distal sigmoid. This could represent proctitis.   Electronically Signed   By: Lorriane Shire M.D.   On: 05/21/2015 10:33   Dg Chest 2 View  05/28/2015   CLINICAL DATA:  Shortness of  Breath  EXAM: CHEST  2 VIEW  COMPARISON:  May 25, 2015  FINDINGS: Central catheter has been removed. No pneumothorax. There is stable airspace consolidation in the left lower lobe. There are small pleural effusions bilaterally. There is patchy atelectasis in the right upper lobe, stable. Heart is upper normal in size with pulmonary vascularity within normal limits. No adenopathy. No bone lesions.  IMPRESSION: Persistent left lower lobe consolidation. Small bilateral effusions. Stable atelectasis right upper lobe. No change in cardiac silhouette.   Electronically Signed   By: Lowella Grip III M.D.   On: 05/28/2015 08:57   Ct Head Wo Contrast  05/21/2015   CLINICAL DATA:  Altered mental status  EXAM: CT HEAD WITHOUT CONTRAST  TECHNIQUE: Contiguous axial images were obtained from the base of the skull through the vertex without intravenous contrast.  COMPARISON:  10/13/2012  FINDINGS: Insert motion images were repeated. Mild cortical volume loss noted with proportional ventricular prominence. Areas of periventricular white matter hypodensity are most compatible with small vessel ischemic change. No acute hemorrhage, infarct, or mass lesion is identified. Orbits and paranasal sinuses are unremarkable. No skull fracture.  IMPRESSION: No acute intracranial abnormality. Stable degree of cortical volume loss.   Electronically Signed   By: Conchita Paris M.D.   On: 05/21/2015 10:01   US Abdomen Complete  05/21/2015   CLINICAL DATA:  Abdominal pain.  Gallstones.  EXAM: ULTRASOUND ABDOMEN COMPLETE  COMPARISON:  CT abdomen and pelvis earlier this same day.  FINDINGS: Gallbladder: Stones are identified within the gallbladder measuring up to 1.5 cm. The gallbladder wall is thickened at 0.7 cm. Sonographer reports negative Murphy's sign.  Common bile duct: Diameter: 0.3 cm.  Liver: No focal lesion identified. Within normal limits in parenchymal echogenicity.  IVC: No abnormality visualized.  Pancreas: Visualized  portion unremarkable.  Spleen: Size and appearance within normal limits.  Right Kidney: Length: 10.9 cm. Echogenicity within normal limits. No hydronephrosis visualized. A 0.8 cm hyperechoic lesion in the lower pole of the left kidney cannot be definitively characterized but likely represents a complex cyst.  Left Kidney: Length: 10.9 cm. Echogenicity within normal limits. No mass or hydronephrosis visualized.  Abdominal aorta: No aneurysm visualized.  Other findings: There is a small volume of abdominal ascites. Bilateral pleural effusions are seen, greater on the right.  IMPRESSION: Gallstones without evidence of cholecystitis. Gallbladder wall thickening is likely related to small volume of abdominal ascites.  Bilateral pleural effusions.   Electronically Signed   By: Inge Rise M.D.   On: 05/21/2015 14:04   Dg Chest Port 1 View  05/25/2015   CLINICAL DATA:  Respiratory failure.  EXAM: PORTABLE CHEST - 1 VIEW  COMPARISON:  05/23/2015  FINDINGS: Improved aeration in the lungs suggest decreased edema and possibly decreased pleural effusions. There continues to be hazy densities at the lung bases with opacification in the retrocardiac space. The nasogastric tube and endotracheal tube have been removed. Right jugular central venous catheter in the SVC region and stable. Negative for a pneumothorax.  IMPRESSION: Improved aeration in the lungs suggests decreasing pulmonary edema and possibly decreasing pleural effusions. There continues to be basilar densities which may represent residual pleural fluid and left basilar consolidation.  Support apparatuses as described.   Electronically Signed   By: Markus Daft M.D.   On: 05/25/2015 09:24   Dg Chest Port 1 View  05/23/2015   CLINICAL DATA:  Respiratory failure.  EXAM: PORTABLE CHEST - 1 VIEW  COMPARISON:  05/22/2015.  FINDINGS: Endotracheal tube, NG tube, right IJ line in stable position. Mediastinum hilar structures are stable. Heart size stable. Bilateral  pulmonary infiltrates again noted. Slight interim improvement. Persistent small right pleural effusion. No pneumothorax.  IMPRESSION: 1. Lines and tubes in stable position. 2. Bilateral pulmonary infiltrates remain. There has been slight improvement. Persistent right pleural effusion.   Electronically Signed   By: Marcello Moores  Register   On: 05/23/2015 07:24   Dg Chest Port 1 View  05/22/2015   CLINICAL DATA:  Hypoxia  EXAM: PORTABLE CHEST - 1 VIEW  COMPARISON:  Study obtained earlier in the day.  FINDINGS: Endotracheal tube tip is 3.5 cm above the carina. Nasogastric tube tip and side port are below the diaphragm. Central catheter tip is in the superior cava. No pneumothorax. There is interstitial edema with bilateral effusions. Heart is mildly enlarged with pulmonary venous hypertension. No bone lesions.  IMPRESSION: Tube and catheter positions as described without pneumothorax. Congestive heart failure. New small left effusion compared to earlier in the day. The degree of edema and right effusion remain stable.   Electronically Signed   By: Lowella Grip III M.D.   On: 05/22/2015 08:32   Dg Chest Port 1 View  05/22/2015   CLINICAL DATA:  Decreased oxygen saturation and shortness of breath  EXAM: PORTABLE CHEST - 1 VIEW  COMPARISON:  May 21, 2015  FINDINGS: Central catheter tip is in the superior cava. No pneumothorax. There is increase in interstitial edema. There is a new right pleural effusion. Heart is mildly enlarged with mild pulmonary venous hypertension. There is atelectatic change superimposed on edema in the left base. No adenopathy.  IMPRESSION: Congestive heart failure with new right effusion increased edema compared to 1 day prior.   Electronically Signed   By: Lowella Grip III M.D.   On: 05/22/2015 07:26   Dg Chest Port 1 View  05/21/2015   CLINICAL DATA:  Followup acute renal failure  EXAM: PORTABLE CHEST - 1 VIEW  COMPARISON:  05/20/2015  FINDINGS: Right IJ central line, tip at the  SVC level.  No pneumothorax.  New diffuse interstitial opacity, compatible with edema. Normal heart size and stable aortic tortuosity, accentuated by rotation. Hyperinflation which is stable from previous. No emphysematous changes on 04/25/2014 chest CT.  IMPRESSION: 1. New right IJ central line.  No pneumothorax. 2. New mild pulmonary edema.   Electronically Signed   By: Monte Fantasia M.D.   On: 05/21/2015 15:42   Dg Chest Portable 1 View  05/20/2015   CLINICAL DATA:  Chest pain, hypotension, syncope  EXAM: PORTABLE CHEST - 1 VIEW  COMPARISON:  04/04/2014  FINDINGS: The heart size and mediastinal contours are within normal limits. Both lungs are clear. The visualized skeletal structures are unremarkable.  IMPRESSION: No active disease.   Electronically Signed   By: Lucienne Capers M.D.   On: 05/20/2015 23:35   Dg Abd Portable 1v  05/22/2015   CLINICAL DATA:  Nasogastric tube placement  EXAM: PORTABLE ABDOMEN - 1 VIEW  COMPARISON:  CT abdomen and pelvis May 21, 2015  FINDINGS: Nasogastric tube tip and side port are within the stomach. There is no bowel dilatation or air-fluid level suggesting obstruction. No free air is seen on this supine examination. There are phleboliths in the pelvis.  IMPRESSION: Nasogastric tube tip and side port in stomach. Bowel gas pattern unremarkable.   Electronically Signed   By: Lowella Grip III M.D.   On: 05/22/2015 08:33   Dg Abd Portable 1v  05/21/2015   CLINICAL DATA:  Chest and abdominal pain. Shortness of breath. Hypotension.  EXAM: PORTABLE ABDOMEN - 1 VIEW  COMPARISON:  01/07/2011  FINDINGS: The right abdomen and low pelvis are not included within the field of view. Superimposed structures obscure visualization of some of the left upper quadrant. Visualized bowel gas pattern is normal without significant colonic or small bowel distention. No radiopaque stones. Degenerative changes in the spine.  IMPRESSION: Limited study.  No evidence of bowel obstruction.    Electronically Signed   By: Lucienne Capers M.D.   On: 05/21/2015 00:00    CBC  Recent Labs Lab 05/25/15 0730 05/26/15 0231 05/27/15 0650 05/28/15 0552 05/29/15 0430  WBC 10.6* 11.1* 11.7* 13.7* 14.2*  HGB 10.8* 10.8* 10.6* 10.8* 10.6*  HCT 30.3* 30.7* 30.6* 31.2* 30.8*  PLT 243 255 261 291 285  MCV 91.8 93.0 93.9 96.0 96.3  MCH 32.7 32.7 32.5 33.2 33.1  MCHC 35.6 35.2 34.6 34.6 34.4  RDW 12.7 12.8 12.9 13.1 13.4    Chemistries   Recent Labs Lab 05/23/15 0420 05/24/15 0400 05/25/15 0730 05/26/15 0231 05/27/15 0650 05/28/15 0552 05/29/15 0430  NA 136 134* 134* 137 136 139 141  K 3.7 3.1* 2.8* 3.2* 3.4* 3.9 3.8  CL 95* 96* 97* 102 101 104 100*  CO2 23 20* 21* 25 24 24 28   GLUCOSE 146* 172* 135* 109* 136* 134* 100*  BUN 33* 36* 32* 30* 29* 24* 23*  CREATININE 4.28* 4.15* 2.82* 2.02* 1.62* 1.48* 1.47*  CALCIUM 8.0* 8.3* 8.7* 8.7* 8.6* 8.8* 8.8*  MG 1.6* 2.0  --   --   --   --   --  AST 121*  --  64*  --   --   --   --   ALT 141*  --  105*  --   --   --   --   ALKPHOS 207*  --  228*  --   --   --   --   BILITOT 0.9  --  0.9  --   --   --   --    ------------------------------------------------------------------------------------------------------------------ estimated creatinine clearance is 24.3 mL/min (by C-G formula based on Cr of 1.47). ------------------------------------------------------------------------------------------------------------------ No results for input(s): HGBA1C in the last 72 hours. ------------------------------------------------------------------------------------------------------------------ No results for input(s): CHOL, HDL, LDLCALC, TRIG, CHOLHDL, LDLDIRECT in the last 72 hours. ------------------------------------------------------------------------------------------------------------------  Recent Labs  05/27/15 0433  TSH 6.047*    ------------------------------------------------------------------------------------------------------------------ No results for input(s): VITAMINB12, FOLATE, FERRITIN, TIBC, IRON, RETICCTPCT in the last 72 hours.  Coagulation profile  Recent Labs Lab 05/25/15 0730 05/26/15 0231 05/27/15 0433 05/28/15 0552 05/29/15 0430  INR 1.56* 1.52* 1.42 1.51* 1.60*    No results for input(s): DDIMER in the last 72 hours.  Cardiac Enzymes No results for input(s): CKMB, TROPONINI, MYOGLOBIN in the last 168 hours.  Invalid input(s): CK ------------------------------------------------------------------------------------------------------------------ Invalid input(s): [place]  05/28/15 1123 05/28/15 1836 05/28/15 2117 05/29/15 0553 05/29/15 0604 05/29/15 1143  GLUCAP 157* 170* 88 102* 103* 168*     Kirkland Figg A M.D. [facility] 05/29/2015, 1:30 PM  Pager: [phone]   Between 7am to 7pm - call Pager - [phone]  After 7pm go to [email or web address] - password [email or web address]  Call night coverage person covering after 7pm

## 2015-05-29 NOTE — Telephone Encounter (Signed)
Daughter, Shauna Hugh called and stated that her mom has been in the hospital for 8 days now. They are talking about releasing her to a rehab. Daughter is concerned that she has been there that long. Dr. Rockey Situ her this morning that when patient was admitted she was showing signs of her kidney starting to shut down. Daughter confused about her mothers condition and would like to speak with you. Would like for you to call #: [phone]

## 2015-05-30 DIAGNOSIS — E038 Other specified hypothyroidism: Secondary | ICD-10-CM

## 2015-05-30 DIAGNOSIS — R197 Diarrhea, unspecified: Secondary | ICD-10-CM

## 2015-05-30 LAB — BASIC METABOLIC PANEL
Anion gap: 12 (ref 5–15)
BUN: 23 mg/dL — AB (ref 6–20)
CO2: 29 mmol/L (ref 22–32)
CREATININE: 1.33 mg/dL — AB (ref 0.44–1.00)
Calcium: 8.7 mg/dL — ABNORMAL LOW (ref 8.9–10.3)
Chloride: 99 mmol/L — ABNORMAL LOW (ref 101–111)
GFR calc non Af Amer: 35 mL/min — ABNORMAL LOW (ref >60)
GFR, EST AFRICAN AMERICAN: 40 mL/min — AB (ref >60)
Glucose, Bld: 96 mg/dL (ref 65–99)
Potassium: 3.2 mmol/L — ABNORMAL LOW (ref 3.5–5.1)
SODIUM: 140 mmol/L (ref 135–145)

## 2015-05-30 LAB — CBC
HEMATOCRIT: 30.6 % — AB (ref 36.0–46.0)
HEMOGLOBIN: 10.4 g/dL — AB (ref 12.0–15.0)
MCH: 32.7 pg (ref 26.0–34.0)
MCHC: 34 g/dL (ref 30.0–36.0)
MCV: 96.2 fL (ref 78.0–100.0)
Platelets: 318 10*3/uL (ref 150–400)
RBC: 3.18 MIL/uL — AB (ref 3.87–5.11)
RDW: 14 % (ref 11.5–15.5)
WBC: 14.8 10*3/uL — ABNORMAL HIGH (ref 4.0–10.5)

## 2015-05-30 LAB — PROTIME-INR
INR: 2.08 — ABNORMAL HIGH (ref 0.00–1.49)
Prothrombin Time: 23.2 seconds — ABNORMAL HIGH (ref 11.6–15.2)

## 2015-05-30 LAB — GLUCOSE, CAPILLARY
Glucose-Capillary: 102 mg/dL — ABNORMAL HIGH (ref 65–99)
Glucose-Capillary: 145 mg/dL — ABNORMAL HIGH (ref 65–99)

## 2015-05-30 MED ORDER — DILTIAZEM HCL ER COATED BEADS 180 MG PO CP24: 180.0000 mg | ORAL_CAPSULE | Freq: Every day | ORAL | Status: DC

## 2015-05-30 MED ORDER — LEVOTHYROXINE SODIUM 75 MCG PO TABS: 75.0000 ug | ORAL_TABLET | Freq: Every day | ORAL | Status: DC

## 2015-05-30 MED ORDER — POTASSIUM CHLORIDE CRYS ER 20 MEQ PO TBCR
60.0000 meq | EXTENDED_RELEASE_TABLET | Freq: Once | ORAL | Status: AC
  Administered 2015-05-30: 60 meq via ORAL
  Filled 2015-05-30: qty 3

## 2015-05-30 MED ORDER — WARFARIN SODIUM 5 MG PO TABS
5.0000 mg | ORAL_TABLET | Freq: Once | ORAL | Status: DC
  Filled 2015-05-30: qty 1

## 2015-05-30 NOTE — Discharge Summary (Signed)
Physician Discharge Summary  [name] PJK:[id number] DOB: [birth year] DOA: 05/20/2015  PCP: Estill Dooms, MD  Admit date: 05/20/2015 Discharge date: 05/30/2015  Time spent: 40 minutes  Recommendations for Outpatient Follow-up:  1. Follow-up with primary care physician in one week.  Discharge Diagnoses:  Principal Problem:   Acute renal failure Active Problems:   OSA (obstructive sleep apnea)   Atrial fibrillation   Hypothyroidism   Diarrhea   Hyperkalemia   Diabetes mellitus type 2, controlled   MODS (multiple organ dysfunction syndrome)   Acute encephalopathy   Syncope and collapse   Abdominal pain   Anticoagulated on Coumadin   Acute respiratory failure with hypoxemia   Malnutrition of moderate degree   Chronic atrial fibrillation   Bradycardia   Other specified hypotension   Rectal bleeding   Coagulopathy   Hypokalemia   Discharge Condition: Stable  Diet recommendation: Heart healthy diet  Filed Weights   05/27/15 0505 05/29/15 0509 05/30/15 0520  Weight: 61.553 kg (135 lb 11.2 oz) 59.829 kg (131 lb 14.4 oz) 58.559 kg (129 lb 1.6 oz)    History of present illness:  [name] is a 81 y.o. female with a history of paroxysmal atrial fibrillation, hypertension, diabetes mellitus, OSA was brought to the ER after patient's son found the patient was increasingly weak confused and having persistent nausea vomiting and diarrhea. As per patient's son patient has been having some productive cough over the last few weeks and had taken to her PCP and was started on Z-Pak 5 days ago. Following the first dose the next day patient started developing diarrhea with rectal bleeding. Patient eventually started developing nausea and vomiting. It was also noticed that patient has been taking some of her medication more than required after patient's son did pill counts. In the ER patient was found to be coagulopathic and in the ER patient was hypotensive and bradycardic with labs  showing markedly elevated creatinine from her baseline with hyperkalemia. Patient also may have taken her metoprolol more than required. ER physician had given glucagon followed by IV bicarbonate and insulin D5W for the hyperkalemia. Patient was given fluid bolus and admitted for further management. Patient's lactic acid was elevated and on call pulmonary critical care was consulted and patient has been admitted for further management of acute renal failure, rectal bleeding, coagulopathy and nausea vomiting and diarrhea. Patient also was given vitamin K 5 mg for coagulopathy.  Hospital Course:   Acute renal failure/Hyperkalemia/Metabolic Acidosis likely due to dehydration, sepsis and medication related, patient was on ACE inhibitor, metformin and spironolactone at home. Patient had progressive hypoxic respiratory failure, after failing BiPAP, required intubation on 6/15. -Creatinine on admission was 8.09 with metabolic acidosis, lactic acidosis.  -Patient underwent hemodialysis  once on 05/22/2015 for volume removal and acute kidney injury.  -Renal function has progressively improved and hence no further hemodialysis is anticipated per nephrology.  -Dialysis catheter was removed on 6/18. -Renal function has significantly improved to 1.3 on the day of discharge. -On discharge discontinued lisinopril she was on prior to admission, restarted metformin and spironolactone.   Acute hypoxic respiratory failure: Patient complaining of shortness of breath today, feeling shallow breathing today - Patient was admitted and had progressively hypoxic respiratory failure and failed BiPAP, required intubation on 6/15, extubated on 6/16 - BNP 1317, I/O's net 4.7 L positive, chest x-ray showed persistent left lower lobe consolidation with small bilateral pleural effusions - Underwent dialysis once on 6/15, after that placed on Lasix for  volume overload. -She did very well, on discharge restarted spironolactone.  Discontinued Lasix. She is euvolemic.  UTI -Empirically treating; unfortunately no urine culture was sent, blood culture has been negative so far, patient received 7 days of IV Rocephin.   Bradycardia with hypotension - probably related to medication   Paroxysmal Atrial Fibrillation -Continue metoprolol, oral Cardizem was added yesterday titrate for heart rate control less than 100  -On Coumadin, INR on discharge is 2.0. -This patients CHA2DS2-VASc Score and unadjusted Ischemic Stroke Rate (% per year) is equal to 9.7 % stroke rate/year from a score of 6 for HTN, female, PAD, diabetes and age above 97. Above score calculated as 1 point each if present [CHF, HTN, DM, Vascular=MI/PAD/Aortic Plaque, Age if 65-74, or Female] Above score calculated as 2 points each if present [Age > 75, or Stroke/TIA/TE]  Hypertension -Currently stable, follow closely, continue beta blocker, Cardizem  Rectal bleeding with coagulopathy  - Resolved, watch hemoglobin closely .  - Was placed on Protonix briefly, discontinued on discharge.  Diarrhea  - C. difficile negative.  Diabetes mellitus type 2 uncontrolled - 4/21 hemoglobin A1c 6.4  -Insulin sliding scale while she was in the hospital, metformin restarted on discharge.  Hypothyroidism - Continue Synthroid 75 g daily, TSH 6.0   Hypokalemia -Replete with oral supplements likely secondary to diuresis.  OSA noncompliant with CPAP -History of memory problems.  Moderate malnutrition - Continue nutritional supplements.  Disposition -Patient seen by PT/OT and recommended skilled nursing facility placement. -Per case management/CSW discussion with family especially son [name] they want her to to go back home. -Tried to call [name] twice, but because of his phone, home health services prescribed.   Procedures:  CVL insertion on 05/21/2015 by Dr. Titus Mould.  Endotracheal intubation on 05/22/2015 done by Dr. Nelda Marseille.  Placement of HD  access catheter, hemodialysis on 6/15  Consultations:  Nephrology.  Patient was under PCCM until 05/24/2015, transferred to [facility] on 05/25/2015   Discharge Exam: Filed Vitals:   05/30/15 0929  BP: 89/64  Pulse: 91  Temp: 98.2 F (36.8 C)  Resp: 18   General: Alert and awake, oriented x3, not in any acute distress. HEENT: anicteric sclera, pupils reactive to light and accommodation, EOMI CVS: S1-S2 clear, no murmur rubs or gallops Chest: clear to auscultation bilaterally, no wheezing, rales or rhonchi Abdomen: soft nontender, nondistended, normal bowel sounds, no organomegaly Extremities: no cyanosis, clubbing or edema noted bilaterally Neuro: Cranial nerves II-XII intact, no focal neurological deficits   Discharge Instructions   Discharge Instructions    Diet - low sodium heart healthy    Complete by:  As directed      Increase activity slowly    Complete by:  As directed           Current Discharge Medication List    START taking these medications   Details  diltiazem (CARDIZEM CD) 180 MG 24 hr capsule Take 1 capsule (180 mg total) by mouth daily. Qty: 30 capsule, Refills: 0      CONTINUE these medications which have CHANGED   Details  levothyroxine (SYNTHROID, LEVOTHROID) 75 MCG tablet Take 1 tablet (75 mcg total) by mouth daily before breakfast. Qty: 30 tablet, Refills: 5      CONTINUE these medications which have NOT CHANGED   Details  estradiol (ESTRACE) 0.5 MG tablet TAKE 1 TABLET EVERY DAY FOR HORMONES Qty: 90 tablet, Refills: 3    hydroxypropyl methylcellulose (ISOPTO TEARS) 2.5 % ophthalmic solution Place 1 drop into both  eyes as needed for dry eyes.    lisinopril (PRINIVIL,ZESTRIL) 5 MG tablet TAKE 1 TABLET BY MOUTH DAILY. Qty: 90 tablet, Refills: 1    metFORMIN (GLUCOPHAGE) 500 MG tablet TAKE 2 TABLETS BY MOUTH EVERY MORNING AND 1 TAB AT SUPPER TO CONTROL BLOOD SUGAR Qty: 90 tablet, Refills: 5    metoprolol (LOPRESSOR) 50 MG tablet TAKE 1  TABLET BY MOUTH TWICE A DAY FOR BLOOD PRESSURE Qty: 60 tablet, Refills: 5    spironolactone (ALDACTONE) 25 MG tablet TAKE 1 TABLET BY MOUTH TWICE A DAY TO STRENGTHEN THE HEART AND REGULATE BLOOD PRESSURE Qty: 60 tablet, Refills: 5    warfarin (COUMADIN) 5 MG tablet Take 1 to 1.5 tablets by mouth daily as directed by coumadin clinic Qty: 40 tablet, Refills: 3       No Known Allergies Follow-up Information    Follow up with [facility].   Why:  they will provide your home health care at your home   Contact information:   [address] [place] [address] [phone]       Follow up with [facility], Viviann Spare, MD In 1 week.   Specialty:  Internal Medicine   Contact information:   [place] [address] [phone]        The results of significant diagnostics from this hospitalization (including imaging, microbiology, ancillary and laboratory) are listed below for reference.    Significant Diagnostic Studies: Ct Abdomen Pelvis Wo Contrast  05/21/2015   CLINICAL DATA:  Sepsis.  GI bleed.  Diarrhea.  Renal failure.  EXAM: CT ABDOMEN AND PELVIS WITHOUT CONTRAST  TECHNIQUE: Multidetector CT imaging of the abdomen and pelvis was performed following the standard protocol without IV contrast.  COMPARISON:  12/15/2003  FINDINGS: There is a moderate right pleural effusion and a small left pleural effusion. There is a small amount of ascites. Hepatic veins are somewhat distended as is the inferior vena cava consistent with elevated right heart pressure.  No focal liver lesions. There are multiple stones in the gallbladder and the gallbladder is quite distended. No dilated bile ducts. Spleen, pancreas, and adrenal glands are normal. There is a single tiny stone in the mid right kidney and there are several tiny stones in the left kidney. No hydronephrosis. There is ascites peripheral to Gerota's fascia bilaterally. There is a tiny hiatal hernia. There are  few diverticula in the distal colon but there is no evidence of diverticulitis. The mucosa of the distal sigmoid and rectum appears slightly prominent which could represent proctitis. The mucosa of the remainder of the colon appears normal. Terminal ileum and small bowel are normal.  Extensive calcification in the abdominal aorta and iliac arteries. Foley catheter in the bladder. Previous removal of the uterus and ovaries. No acute osseous abnormality.  IMPRESSION: 1. Bilateral pleural effusions, moderate on the right and small on the left. 2. Ascites. 3. Distended gallbladder with multiple gallstones. 4. Tiny bilateral renal stones. 5. Slight prominence of the mucosa in the rectum and distal sigmoid. This could represent proctitis.   Electronically Signed   By: Lorriane Shire M.D.   On: 05/21/2015 10:33   Dg Chest 2 View  05/28/2015   CLINICAL DATA:  Shortness of Breath  EXAM: CHEST  2 VIEW  COMPARISON:  May 25, 2015  FINDINGS: Central catheter has been removed. No pneumothorax. There is stable airspace consolidation in the left lower lobe. There are small pleural effusions bilaterally. There is patchy  atelectasis in the right upper lobe, stable. Heart is upper normal in size with pulmonary vascularity within normal limits. No adenopathy. No bone lesions.  IMPRESSION: Persistent left lower lobe consolidation. Small bilateral effusions. Stable atelectasis right upper lobe. No change in cardiac silhouette.   Electronically Signed   By: Lowella Grip III M.D.   On: 05/28/2015 08:57   Ct Head Wo Contrast  05/21/2015   CLINICAL DATA:  Altered mental status  EXAM: CT HEAD WITHOUT CONTRAST  TECHNIQUE: Contiguous axial images were obtained from the base of the skull through the vertex without intravenous contrast.  COMPARISON:  10/13/2012  FINDINGS: Insert motion images were repeated. Mild cortical volume loss noted with proportional ventricular prominence. Areas of periventricular white matter hypodensity are  most compatible with small vessel ischemic change. No acute hemorrhage, infarct, or mass lesion is identified. Orbits and paranasal sinuses are unremarkable. No skull fracture.  IMPRESSION: No acute intracranial abnormality. Stable degree of cortical volume loss.   Electronically Signed   By: Conchita Paris M.D.   On: 05/21/2015 10:01   US Abdomen Complete  05/21/2015   CLINICAL DATA:  Abdominal pain.  Gallstones.  EXAM: ULTRASOUND ABDOMEN COMPLETE  COMPARISON:  CT abdomen and pelvis earlier this same day.  FINDINGS: Gallbladder: Stones are identified within the gallbladder measuring up to 1.5 cm. The gallbladder wall is thickened at 0.7 cm. Sonographer reports negative Murphy's sign.  Common bile duct: Diameter: 0.3 cm.  Liver: No focal lesion identified. Within normal limits in parenchymal echogenicity.  IVC: No abnormality visualized.  Pancreas: Visualized portion unremarkable.  Spleen: Size and appearance within normal limits.  Right Kidney: Length: 10.9 cm. Echogenicity within normal limits. No hydronephrosis visualized. A 0.8 cm hyperechoic lesion in the lower pole of the left kidney cannot be definitively characterized but likely represents a complex cyst.  Left Kidney: Length: 10.9 cm. Echogenicity within normal limits. No mass or hydronephrosis visualized.  Abdominal aorta: No aneurysm visualized.  Other findings: There is a small volume of abdominal ascites. Bilateral pleural effusions are seen, greater on the right.  IMPRESSION: Gallstones without evidence of cholecystitis. Gallbladder wall thickening is likely related to small volume of abdominal ascites.  Bilateral pleural effusions.   Electronically Signed   By: Inge Rise M.D.   On: 05/21/2015 14:04   Dg Chest Port 1 View  05/25/2015   CLINICAL DATA:  Respiratory failure.  EXAM: PORTABLE CHEST - 1 VIEW  COMPARISON:  05/23/2015  FINDINGS: Improved aeration in the lungs suggest decreased edema and possibly decreased pleural effusions. There  continues to be hazy densities at the lung bases with opacification in the retrocardiac space. The nasogastric tube and endotracheal tube have been removed. Right jugular central venous catheter in the SVC region and stable. Negative for a pneumothorax.  IMPRESSION: Improved aeration in the lungs suggests decreasing pulmonary edema and possibly decreasing pleural effusions. There continues to be basilar densities which may represent residual pleural fluid and left basilar consolidation.  Support apparatuses as described.   Electronically Signed   By: Markus Daft M.D.   On: 05/25/2015 09:24   Dg Chest Port 1 View  05/23/2015   CLINICAL DATA:  Respiratory failure.  EXAM: PORTABLE CHEST - 1 VIEW  COMPARISON:  05/22/2015.  FINDINGS: Endotracheal tube, NG tube, right IJ line in stable position. Mediastinum hilar structures are stable. Heart size stable. Bilateral pulmonary infiltrates again noted. Slight interim improvement. Persistent small right pleural effusion. No pneumothorax.  IMPRESSION: 1. Lines and tubes in  stable position. 2. Bilateral pulmonary infiltrates remain. There has been slight improvement. Persistent right pleural effusion.   Electronically Signed   By: Marcello Moores  Register   On: 05/23/2015 07:24   Dg Chest Port 1 View  05/22/2015   CLINICAL DATA:  Hypoxia  EXAM: PORTABLE CHEST - 1 VIEW  COMPARISON:  Study obtained earlier in the day.  FINDINGS: Endotracheal tube tip is 3.5 cm above the carina. Nasogastric tube tip and side port are below the diaphragm. Central catheter tip is in the superior cava. No pneumothorax. There is interstitial edema with bilateral effusions. Heart is mildly enlarged with pulmonary venous hypertension. No bone lesions.  IMPRESSION: Tube and catheter positions as described without pneumothorax. Congestive heart failure. New small left effusion compared to earlier in the day. The degree of edema and right effusion remain stable.   Electronically Signed   By: Lowella Grip  III M.D.   On: 05/22/2015 08:32   Dg Chest Port 1 View  05/22/2015   CLINICAL DATA:  Decreased oxygen saturation and shortness of breath  EXAM: PORTABLE CHEST - 1 VIEW  COMPARISON:  May 21, 2015  FINDINGS: Central catheter tip is in the superior cava. No pneumothorax. There is increase in interstitial edema. There is a new right pleural effusion. Heart is mildly enlarged with mild pulmonary venous hypertension. There is atelectatic change superimposed on edema in the left base. No adenopathy.  IMPRESSION: Congestive heart failure with new right effusion increased edema compared to 1 day prior.   Electronically Signed   By: Lowella Grip III M.D.   On: 05/22/2015 07:26   Dg Chest Port 1 View  05/21/2015   CLINICAL DATA:  Followup acute renal failure  EXAM: PORTABLE CHEST - 1 VIEW  COMPARISON:  05/20/2015  FINDINGS: Right IJ central line, tip at the SVC level.  No pneumothorax.  New diffuse interstitial opacity, compatible with edema. Normal heart size and stable aortic tortuosity, accentuated by rotation. Hyperinflation which is stable from previous. No emphysematous changes on 04/25/2014 chest CT.  IMPRESSION: 1. New right IJ central line.  No pneumothorax. 2. New mild pulmonary edema.   Electronically Signed   By: Monte Fantasia M.D.   On: 05/21/2015 15:42   Dg Chest Portable 1 View  05/20/2015   CLINICAL DATA:  Chest pain, hypotension, syncope  EXAM: PORTABLE CHEST - 1 VIEW  COMPARISON:  04/04/2014  FINDINGS: The heart size and mediastinal contours are within normal limits. Both lungs are clear. The visualized skeletal structures are unremarkable.  IMPRESSION: No active disease.   Electronically Signed   By: Lucienne Capers M.D.   On: 05/20/2015 23:35   Dg Abd Portable 1v  05/22/2015   CLINICAL DATA:  Nasogastric tube placement  EXAM: PORTABLE ABDOMEN - 1 VIEW  COMPARISON:  CT abdomen and pelvis May 21, 2015  FINDINGS: Nasogastric tube tip and side port are within the stomach. There is no bowel  dilatation or air-fluid level suggesting obstruction. No free air is seen on this supine examination. There are phleboliths in the pelvis.  IMPRESSION: Nasogastric tube tip and side port in stomach. Bowel gas pattern unremarkable.   Electronically Signed   By: Lowella Grip III M.D.   On: 05/22/2015 08:33   Dg Abd Portable 1v  05/21/2015   CLINICAL DATA:  Chest and abdominal pain. Shortness of breath. Hypotension.  EXAM: PORTABLE ABDOMEN - 1 VIEW  COMPARISON:  01/07/2011  FINDINGS: The right abdomen and low pelvis are not included within the  field of view. Superimposed structures obscure visualization of some of the left upper quadrant. Visualized bowel gas pattern is normal without significant colonic or small bowel distention. No radiopaque stones. Degenerative changes in the spine.  IMPRESSION: Limited study.  No evidence of bowel obstruction.   Electronically Signed   By: Lucienne Capers M.D.   On: 05/21/2015 00:00    Microbiology: Recent Results (from the past 240 hour(s))  MRSA PCR Screening     Status: None   Collection Time: 05/21/15  3:00 AM  Result Value Ref Range Status   MRSA by PCR NEGATIVE NEGATIVE Final    Comment:        The GeneXpert MRSA Assay (FDA approved for NASAL specimens only), is one component of a comprehensive MRSA colonization surveillance program. It is not intended to diagnose MRSA infection nor to guide or monitor treatment for MRSA infections.   Culture, blood (x 2)     Status: None   Collection Time: 05/21/15  7:35 AM  Result Value Ref Range Status   Specimen Description BLOOD LEFT HAND  Final   Special Requests BOTTLES DRAWN AEROBIC AND ANAEROBIC 4 CC EACH  Final   Culture   Final    NO GROWTH 5 DAYS Performed at [facility]    Report Status 05/27/2015 FINAL  Final  Culture, blood (x 2)     Status: None   Collection Time: 05/21/15  7:38 AM  Result Value Ref Range Status   Specimen Description BLOOD RIGHT HAND  Final   Special  Requests BOTTLES DRAWN AEROBIC ONLY 5CC  Final   Culture   Final    NO GROWTH 5 DAYS Performed at [facility]    Report Status 05/27/2015 FINAL  Final  Clostridium Difficile by PCR (not at [facility])     Status: None   Collection Time: 05/21/15 12:00 PM  Result Value Ref Range Status   C difficile by pcr NEGATIVE NEGATIVE Final     Labs: Basic Metabolic Panel:  Recent Labs Lab 05/24/15 0400  05/26/15 0231 05/27/15 0650 05/28/15 0552 05/29/15 0430 05/30/15 0316  NA 134*  < > 137 136 139 141 140  K 3.1*  < > 3.2* 3.4* 3.9 3.8 3.2*  CL 96*  < > 102 101 104 100* 99*  CO2 20*  < > 25 24 24 28 29   GLUCOSE 172*  < > 109* 136* 134* 100* 96  BUN 36*  < > 30* 29* 24* 23* 23*  CREATININE 4.15*  < > 2.02* 1.62* 1.48* 1.47* 1.33*  CALCIUM 8.3*  < > 8.7* 8.6* 8.8* 8.8* 8.7*  MG 2.0  --   --   --   --   --   --   PHOS 4.1  --   --   --   --   --   --   < > = values in this interval not displayed. Liver Function Tests:  Recent Labs Lab 05/25/15 0730  AST 64*  ALT 105*  ALKPHOS 228*  BILITOT 0.9  PROT 6.2*  ALBUMIN 3.1*   No results for input(s): LIPASE, AMYLASE in the last 168 hours. No results for input(s): AMMONIA in the last 168 hours. CBC:  Recent Labs Lab 05/26/15 0231 05/27/15 0650 05/28/15 0552 05/29/15 0430 05/30/15 0316  WBC 11.1* 11.7* 13.7* 14.2* 14.8*  HGB 10.8* 10.6* 10.8* 10.6* 10.4*  HCT 30.7* 30.6* 31.2* 30.8* 30.6*  MCV 93.0 93.9 96.0 96.3 96.2  PLT 255 261 291  285 318   Cardiac Enzymes: No results for input(s): CKTOTAL, CKMB, CKMBINDEX, TROPONINI in the last 168 hours. BNP: BNP (last 3 results)  Recent Labs  05/28/15 0552  BNP 1317.9*    ProBNP (last 3 results) No results for input(s): PROBNP in the last 8760 hours.  CBG:  Recent Labs Lab 05/29/15 0604 05/29/15 1143 05/29/15 1635 05/29/15 2150 05/30/15 0630  GLUCAP 103* 168* 330* 81 102*       Signed:  Jaeceon Michelin A  [facility] 05/30/2015, 11:21 AM

## 2015-05-30 NOTE — Progress Notes (Signed)
Pt. discharged home. Discharge instructions given to patient and son.

## 2015-05-30 NOTE — Progress Notes (Signed)
ANTICOAGULATION CONSULT NOTE - FOLLOW UP  Pharmacy Consult:  Coumadin Indication: Afib  No Known Allergies  Patient Measurements: Height: 5\' 5"  (165.1 cm) Weight: 129 lb 1.6 oz (58.559 kg) IBW/kg (Calculated) : 57  Vital Signs: Temp: 98.3 F (36.8 C) (06/23 0520) Temp Source: Oral (06/23 0520) BP: 120/84 mmHg (06/23 0520) Pulse Rate: 115 (06/23 0520)  Labs:  Recent Labs  05/28/15 0552 05/29/15 0430 05/30/15 0316  HGB 10.8* 10.6* 10.4*  HCT 31.2* 30.8* 30.6*  PLT 291 285 318  LABPROT 18.3* 19.1* 23.2*  INR 1.51* 1.60* 2.08*  CREATININE 1.48* 1.47* 1.33*    Estimated Creatinine Clearance: 26.8 mL/min (by C-G formula based on Cr of 1.33).     Assessment: 59 YOF continues on Coumadin from PTA for history of Afib.  Patient's INR was supra-therapeutic on admit and reversed with Vitamin K SQ 10mg  x 2 and FFP x4.  INR therapeutic today; no bleeding reported.   Goal of Therapy:  INR 2-3    Plan:  - Coumadin 5mg  PO today - Daily PT / INR  - Watch WBC trend and CBGs - F/U KCL supplementation   Jonhatan Hearty D. Mina Marble, PharmD, BCPS Pager:  [phone] 05/30/2015, 8:35 AM

## 2015-05-30 NOTE — Progress Notes (Addendum)
Notified pt's son Shellyann Wandrey that pt has d/c orders to go home as instructed by pt's primary nurse.  Son stated that he will be here in an hour.  Primary nurse made aware.  Karie Kirks, Therapist, sports.

## 2015-05-31 ENCOUNTER — Telehealth: Payer: Self-pay | Admitting: Cardiovascular Disease

## 2015-05-31 DIAGNOSIS — I4891 Unspecified atrial fibrillation: Secondary | ICD-10-CM

## 2015-05-31 DIAGNOSIS — E1165 Type 2 diabetes mellitus with hyperglycemia: Secondary | ICD-10-CM | POA: Diagnosis not present

## 2015-05-31 DIAGNOSIS — N182 Chronic kidney disease, stage 2 (mild): Secondary | ICD-10-CM

## 2015-05-31 DIAGNOSIS — I120 Hypertensive chronic kidney disease with stage 5 chronic kidney disease or end stage renal disease: Secondary | ICD-10-CM | POA: Diagnosis not present

## 2015-05-31 NOTE — Telephone Encounter (Signed)
Received a call from [place] with [facility].She will be seeing patient today.She will do a INR and fax results to [place].

## 2015-06-03 ENCOUNTER — Telehealth: Payer: Self-pay | Admitting: Pharmacist Clinician (PhC)/ Clinical Pharmacy Specialist

## 2015-06-03 ENCOUNTER — Ambulatory Visit (INDEPENDENT_AMBULATORY_CARE_PROVIDER_SITE_OTHER): Payer: PRIVATE HEALTH INSURANCE | Admitting: Interventional Cardiology

## 2015-06-03 DIAGNOSIS — I482 Chronic atrial fibrillation, unspecified: Secondary | ICD-10-CM

## 2015-06-03 DIAGNOSIS — Z7901 Long term (current) use of anticoagulants: Secondary | ICD-10-CM

## 2015-06-03 LAB — POCT INR: INR: 2.2

## 2015-06-03 NOTE — Telephone Encounter (Signed)
Forward to [facility]

## 2015-06-03 NOTE — Telephone Encounter (Signed)
Luann from [facility] is calling to report Pt/ INR results. PT was 25.8 and INR was 2.2 and is currently taking 5mg  Sun Tues Thurs Sat Sun and Mon Wed and Fri she taking 7.5mg  .. Please call if you have any questions .Marland Kitchen  Thanks

## 2015-06-03 NOTE — Telephone Encounter (Signed)
Results noted, see anticoagulation note in Epic.  

## 2015-06-04 ENCOUNTER — Other Ambulatory Visit: Payer: Self-pay | Admitting: Cardiovascular Disease

## 2015-06-05 ENCOUNTER — Inpatient Hospital Stay (HOSPITAL_COMMUNITY)
Admission: EM | Admit: 2015-06-05 | Discharge: 2015-06-09 | DRG: 640 | Disposition: A | Discharge status: Home Health | Payer: Medicare Other | Attending: Internal Medicine | Admitting: Internal Medicine

## 2015-06-05 ENCOUNTER — Ambulatory Visit (HOSPITAL_COMMUNITY): Payer: Medicare Other

## 2015-06-05 ENCOUNTER — Encounter (HOSPITAL_COMMUNITY): Payer: Self-pay | Admitting: Emergency Medicine

## 2015-06-05 ENCOUNTER — Emergency Department (HOSPITAL_COMMUNITY): Payer: Medicare Other

## 2015-06-05 ENCOUNTER — Inpatient Hospital Stay (HOSPITAL_COMMUNITY): Payer: Medicare Other

## 2015-06-05 ENCOUNTER — Ambulatory Visit: Payer: Medicare Other | Admitting: Internal Medicine

## 2015-06-05 DIAGNOSIS — I4891 Unspecified atrial fibrillation: Secondary | ICD-10-CM | POA: Diagnosis present

## 2015-06-05 DIAGNOSIS — I2510 Atherosclerotic heart disease of native coronary artery without angina pectoris: Secondary | ICD-10-CM | POA: Diagnosis present

## 2015-06-05 DIAGNOSIS — K224 Dyskinesia of esophagus: Secondary | ICD-10-CM | POA: Diagnosis present

## 2015-06-05 DIAGNOSIS — T500X5A Adverse effect of mineralocorticoids and their antagonists, initial encounter: Secondary | ICD-10-CM | POA: Diagnosis present

## 2015-06-05 DIAGNOSIS — G4733 Obstructive sleep apnea (adult) (pediatric): Secondary | ICD-10-CM | POA: Diagnosis present

## 2015-06-05 DIAGNOSIS — E039 Hypothyroidism, unspecified: Secondary | ICD-10-CM | POA: Diagnosis present

## 2015-06-05 DIAGNOSIS — E872 Acidosis: Secondary | ICD-10-CM | POA: Diagnosis present

## 2015-06-05 DIAGNOSIS — I480 Paroxysmal atrial fibrillation: Secondary | ICD-10-CM | POA: Diagnosis present

## 2015-06-05 DIAGNOSIS — J9600 Acute respiratory failure, unspecified whether with hypoxia or hypercapnia: Secondary | ICD-10-CM

## 2015-06-05 DIAGNOSIS — R109 Unspecified abdominal pain: Secondary | ICD-10-CM | POA: Diagnosis present

## 2015-06-05 DIAGNOSIS — K7200 Acute and subacute hepatic failure without coma: Secondary | ICD-10-CM | POA: Diagnosis present

## 2015-06-05 DIAGNOSIS — Z888 Allergy status to other drugs, medicaments and biological substances status: Secondary | ICD-10-CM | POA: Diagnosis not present

## 2015-06-05 DIAGNOSIS — J9690 Respiratory failure, unspecified, unspecified whether with hypoxia or hypercapnia: Secondary | ICD-10-CM

## 2015-06-05 DIAGNOSIS — I129 Hypertensive chronic kidney disease with stage 1 through stage 4 chronic kidney disease, or unspecified chronic kidney disease: Secondary | ICD-10-CM | POA: Diagnosis present

## 2015-06-05 DIAGNOSIS — J9621 Acute and chronic respiratory failure with hypoxia: Secondary | ICD-10-CM | POA: Diagnosis not present

## 2015-06-05 DIAGNOSIS — Z79890 Hormone replacement therapy (postmenopausal): Secondary | ICD-10-CM

## 2015-06-05 DIAGNOSIS — I482 Chronic atrial fibrillation: Secondary | ICD-10-CM | POA: Diagnosis not present

## 2015-06-05 DIAGNOSIS — R740 Nonspecific elevation of levels of transaminase and lactic acid dehydrogenase [LDH]: Secondary | ICD-10-CM

## 2015-06-05 DIAGNOSIS — E119 Type 2 diabetes mellitus without complications: Secondary | ICD-10-CM

## 2015-06-05 DIAGNOSIS — H9190 Unspecified hearing loss, unspecified ear: Secondary | ICD-10-CM | POA: Diagnosis present

## 2015-06-05 DIAGNOSIS — N182 Chronic kidney disease, stage 2 (mild): Secondary | ICD-10-CM | POA: Diagnosis present

## 2015-06-05 DIAGNOSIS — R001 Bradycardia, unspecified: Secondary | ICD-10-CM | POA: Diagnosis present

## 2015-06-05 DIAGNOSIS — T464X5A Adverse effect of angiotensin-converting-enzyme inhibitors, initial encounter: Secondary | ICD-10-CM | POA: Diagnosis present

## 2015-06-05 DIAGNOSIS — G609 Hereditary and idiopathic neuropathy, unspecified: Secondary | ICD-10-CM | POA: Diagnosis present

## 2015-06-05 DIAGNOSIS — E861 Hypovolemia: Secondary | ICD-10-CM | POA: Diagnosis present

## 2015-06-05 DIAGNOSIS — G9341 Metabolic encephalopathy: Secondary | ICD-10-CM | POA: Diagnosis present

## 2015-06-05 DIAGNOSIS — Z66 Do not resuscitate: Secondary | ICD-10-CM | POA: Diagnosis present

## 2015-06-05 DIAGNOSIS — I252 Old myocardial infarction: Secondary | ICD-10-CM

## 2015-06-05 DIAGNOSIS — Z8249 Family history of ischemic heart disease and other diseases of the circulatory system: Secondary | ICD-10-CM | POA: Diagnosis not present

## 2015-06-05 DIAGNOSIS — E785 Hyperlipidemia, unspecified: Secondary | ICD-10-CM | POA: Diagnosis present

## 2015-06-05 DIAGNOSIS — N179 Acute kidney failure, unspecified: Secondary | ICD-10-CM

## 2015-06-05 DIAGNOSIS — R6521 Severe sepsis with septic shock: Secondary | ICD-10-CM

## 2015-06-05 DIAGNOSIS — J9601 Acute respiratory failure with hypoxia: Secondary | ICD-10-CM | POA: Diagnosis present

## 2015-06-05 DIAGNOSIS — Z978 Presence of other specified devices: Secondary | ICD-10-CM

## 2015-06-05 DIAGNOSIS — E11649 Type 2 diabetes mellitus with hypoglycemia without coma: Secondary | ICD-10-CM | POA: Diagnosis not present

## 2015-06-05 DIAGNOSIS — I6529 Occlusion and stenosis of unspecified carotid artery: Secondary | ICD-10-CM | POA: Diagnosis present

## 2015-06-05 DIAGNOSIS — R7401 Elevation of levels of liver transaminase levels: Secondary | ICD-10-CM

## 2015-06-05 DIAGNOSIS — G311 Senile degeneration of brain, not elsewhere classified: Secondary | ICD-10-CM | POA: Diagnosis present

## 2015-06-05 DIAGNOSIS — Z7901 Long term (current) use of anticoagulants: Secondary | ICD-10-CM | POA: Diagnosis not present

## 2015-06-05 DIAGNOSIS — J690 Pneumonitis due to inhalation of food and vomit: Secondary | ICD-10-CM

## 2015-06-05 DIAGNOSIS — E875 Hyperkalemia: Secondary | ICD-10-CM | POA: Diagnosis present

## 2015-06-05 DIAGNOSIS — R4020 Unspecified coma: Secondary | ICD-10-CM

## 2015-06-05 DIAGNOSIS — R4182 Altered mental status, unspecified: Secondary | ICD-10-CM | POA: Diagnosis not present

## 2015-06-05 DIAGNOSIS — I10 Essential (primary) hypertension: Secondary | ICD-10-CM | POA: Diagnosis present

## 2015-06-05 DIAGNOSIS — Z833 Family history of diabetes mellitus: Secondary | ICD-10-CM | POA: Diagnosis not present

## 2015-06-05 DIAGNOSIS — I959 Hypotension, unspecified: Secondary | ICD-10-CM | POA: Diagnosis present

## 2015-06-05 DIAGNOSIS — R6520 Severe sepsis without septic shock: Secondary | ICD-10-CM

## 2015-06-05 DIAGNOSIS — J90 Pleural effusion, not elsewhere classified: Secondary | ICD-10-CM

## 2015-06-05 DIAGNOSIS — A419 Sepsis, unspecified organism: Secondary | ICD-10-CM

## 2015-06-05 LAB — COMPREHENSIVE METABOLIC PANEL
ALBUMIN: 2.5 g/dL — AB (ref 3.5–5.0)
ALK PHOS: 338 U/L — AB (ref 38–126)
ALT: 441 U/L — ABNORMAL HIGH (ref 14–54)
ANION GAP: 20 — AB (ref 5–15)
AST: 905 U/L — ABNORMAL HIGH (ref 15–41)
BUN: 16 mg/dL (ref 6–20)
CHLORIDE: 102 mmol/L (ref 101–111)
CO2: 9 mmol/L — ABNORMAL LOW (ref 22–32)
CREATININE: 1.73 mg/dL — AB (ref 0.44–1.00)
Calcium: 7.8 mg/dL — ABNORMAL LOW (ref 8.9–10.3)
GFR calc Af Amer: 29 mL/min — ABNORMAL LOW (ref >60)
GFR calc non Af Amer: 25 mL/min — ABNORMAL LOW (ref >60)
Glucose, Bld: 399 mg/dL — ABNORMAL HIGH (ref 65–99)
Potassium: 5.8 mmol/L — ABNORMAL HIGH (ref 3.5–5.1)
Sodium: 131 mmol/L — ABNORMAL LOW (ref 135–145)
TOTAL PROTEIN: 4.8 g/dL — AB (ref 6.5–8.1)
Total Bilirubin: 0.9 mg/dL (ref 0.3–1.2)

## 2015-06-05 LAB — CORTISOL: CORTISOL PLASMA: 31.6 ug/dL

## 2015-06-05 LAB — BASIC METABOLIC PANEL
Anion gap: 13 (ref 5–15)
BUN: 18 mg/dL (ref 6–20)
CALCIUM: 9.3 mg/dL (ref 8.9–10.3)
CO2: 20 mmol/L — ABNORMAL LOW (ref 22–32)
Chloride: 105 mmol/L (ref 101–111)
Creatinine, Ser: 1.58 mg/dL — ABNORMAL HIGH (ref 0.44–1.00)
GFR, EST AFRICAN AMERICAN: 33 mL/min — AB (ref >60)
GFR, EST NON AFRICAN AMERICAN: 28 mL/min — AB (ref >60)
GLUCOSE: 159 mg/dL — AB (ref 65–99)
POTASSIUM: 4.4 mmol/L (ref 3.5–5.1)
Sodium: 138 mmol/L (ref 135–145)

## 2015-06-05 LAB — I-STAT CHEM 8, ED
BUN: 19 mg/dL (ref 6–20)
CALCIUM ION: 1.06 mmol/L — AB (ref 1.13–1.30)
CREATININE: 1.5 mg/dL — AB (ref 0.44–1.00)
Chloride: 105 mmol/L (ref 101–111)
GLUCOSE: 394 mg/dL — AB (ref 65–99)
HCT: 34 % — ABNORMAL LOW (ref 36.0–46.0)
Hemoglobin: 11.6 g/dL — ABNORMAL LOW (ref 12.0–15.0)
Potassium: 5.7 mmol/L — ABNORMAL HIGH (ref 3.5–5.1)
SODIUM: 132 mmol/L — AB (ref 135–145)
TCO2: 11 mmol/L (ref 0–100)

## 2015-06-05 LAB — CBC WITH DIFFERENTIAL/PLATELET
Basophils Absolute: 0 10*3/uL (ref 0.0–0.1)
Basophils Relative: 0 % (ref 0–1)
Eosinophils Absolute: 0.1 10*3/uL (ref 0.0–0.7)
Eosinophils Relative: 1 % (ref 0–5)
HEMATOCRIT: 31.7 % — AB (ref 36.0–46.0)
Hemoglobin: 10.3 g/dL — ABNORMAL LOW (ref 12.0–15.0)
Lymphocytes Relative: 23 % (ref 12–46)
Lymphs Abs: 3 10*3/uL (ref 0.7–4.0)
MCH: 33.7 pg (ref 26.0–34.0)
MCHC: 32.5 g/dL (ref 30.0–36.0)
MCV: 103.6 fL — ABNORMAL HIGH (ref 78.0–100.0)
MONO ABS: 0.5 10*3/uL (ref 0.1–1.0)
Monocytes Relative: 4 % (ref 3–12)
NEUTROS ABS: 9.4 10*3/uL — AB (ref 1.7–7.7)
Neutrophils Relative %: 72 % (ref 43–77)
Platelets: 162 10*3/uL (ref 150–400)
RBC: 3.06 MIL/uL — ABNORMAL LOW (ref 3.87–5.11)
RDW: 15.9 % — ABNORMAL HIGH (ref 11.5–15.5)
WBC: 13 10*3/uL — ABNORMAL HIGH (ref 4.0–10.5)

## 2015-06-05 LAB — URINALYSIS, ROUTINE W REFLEX MICROSCOPIC
Bilirubin Urine: NEGATIVE
GLUCOSE, UA: NEGATIVE mg/dL
Hgb urine dipstick: NEGATIVE
KETONES UR: NEGATIVE mg/dL
Leukocytes, UA: NEGATIVE
Nitrite: NEGATIVE
Protein, ur: 30 mg/dL — AB
Specific Gravity, Urine: 1.01 (ref 1.005–1.030)
Urobilinogen, UA: 0.2 mg/dL (ref 0.0–1.0)
pH: 6.5 (ref 5.0–8.0)

## 2015-06-05 LAB — GLUCOSE, CAPILLARY
GLUCOSE-CAPILLARY: 128 mg/dL — AB (ref 65–99)
Glucose-Capillary: 119 mg/dL — ABNORMAL HIGH (ref 65–99)
Glucose-Capillary: 224 mg/dL — ABNORMAL HIGH (ref 65–99)
Glucose-Capillary: 76 mg/dL (ref 65–99)
Glucose-Capillary: 82 mg/dL (ref 65–99)

## 2015-06-05 LAB — LACTIC ACID, PLASMA: Lactic Acid, Venous: 3.5 mmol/L (ref 0.5–2.0)

## 2015-06-05 LAB — PROTIME-INR
INR: 4.74 — ABNORMAL HIGH (ref 0.00–1.49)
INR: 4.15 — ABNORMAL HIGH (ref 0.00–1.49)
PROTHROMBIN TIME: 39.1 s — AB (ref 11.6–15.2)
Prothrombin Time: 43.2 seconds — ABNORMAL HIGH (ref 11.6–15.2)

## 2015-06-05 LAB — TROPONIN I
Troponin I: <0.03 ng/mL (ref <0.031)
Troponin I: <0.03 ng/mL (ref <0.031)

## 2015-06-05 LAB — CBG MONITORING, ED
GLUCOSE-CAPILLARY: 299 mg/dL — AB (ref 65–99)
Glucose-Capillary: 337 mg/dL — ABNORMAL HIGH (ref 65–99)

## 2015-06-05 LAB — I-STAT CG4 LACTIC ACID, ED: Lactic Acid, Venous: 10.74 mmol/L (ref 0.5–2.0)

## 2015-06-05 LAB — I-STAT ARTERIAL BLOOD GAS, ED
ACID-BASE DEFICIT: 11 mmol/L — AB (ref 0.0–2.0)
BICARBONATE: 15.2 meq/L — AB (ref 20.0–24.0)
O2 SAT: 98 %
TCO2: 16 mmol/L (ref 0–100)
pCO2 arterial: 31.6 mmHg — ABNORMAL LOW (ref 35.0–45.0)
pH, Arterial: 7.273 — ABNORMAL LOW (ref 7.350–7.450)
pO2, Arterial: 102 mmHg — ABNORMAL HIGH (ref 80.0–100.0)

## 2015-06-05 LAB — LIPASE, BLOOD: LIPASE: 97 U/L — AB (ref 22–51)

## 2015-06-05 LAB — URINE MICROSCOPIC-ADD ON

## 2015-06-05 LAB — BETA-HYDROXYBUTYRIC ACID: Beta-Hydroxybutyric Acid: 0.17 mmol/L (ref 0.05–0.27)

## 2015-06-05 LAB — I-STAT TROPONIN, ED: TROPONIN I, POC: 0 ng/mL (ref 0.00–0.08)

## 2015-06-05 LAB — PROCALCITONIN: Procalcitonin: 0.45 ng/mL

## 2015-06-05 MED ORDER — DEXTROSE 50 % IV SOLN
INTRAVENOUS | Status: DC | PRN
  Administered 2015-06-05: 1 via INTRAVENOUS

## 2015-06-05 MED ORDER — FAMOTIDINE IN NACL 20-0.9 MG/50ML-% IV SOLN
20.0000 mg | Freq: Two times a day (BID) | INTRAVENOUS | Status: DC
Start: 2015-06-05 — End: 2015-06-05
  Filled 2015-06-05: qty 50

## 2015-06-05 MED ORDER — SODIUM BICARBONATE 8.4 % IV SOLN
INTRAVENOUS | Status: DC | PRN
  Administered 2015-06-05 (×2): 50 meq via INTRAVENOUS

## 2015-06-05 MED ORDER — SODIUM CHLORIDE 0.9 % IV SOLN
INTRAVENOUS | Status: DC
  Administered 2015-06-05 (×2): via INTRAVENOUS

## 2015-06-05 MED ORDER — ETOMIDATE 2 MG/ML IV SOLN
INTRAVENOUS | Status: DC | PRN
  Administered 2015-06-05: 20 mg via INTRAVENOUS

## 2015-06-05 MED ORDER — SODIUM CHLORIDE 0.9 % IV BOLUS (SEPSIS)
1000.0000 mL | INTRAVENOUS | Status: DC
  Administered 2015-06-05: 1000 mL via INTRAVENOUS

## 2015-06-05 MED ORDER — LIDOCAINE HCL (CARDIAC) 20 MG/ML IV SOLN
INTRAVENOUS | Status: AC
  Filled 2015-06-05: qty 5

## 2015-06-05 MED ORDER — PIPERACILLIN-TAZOBACTAM 3.375 G IVPB
3.3750 g | Freq: Three times a day (TID) | INTRAVENOUS | Status: DC
  Administered 2015-06-05 – 2015-06-06 (×3): 3.375 g via INTRAVENOUS
  Filled 2015-06-05 (×5): qty 50

## 2015-06-05 MED ORDER — PIPERACILLIN-TAZOBACTAM 3.375 G IVPB 30 MIN
3.3750 g | Freq: Once | INTRAVENOUS | Status: AC
  Administered 2015-06-05: 3.375 g via INTRAVENOUS
  Filled 2015-06-05: qty 50

## 2015-06-05 MED ORDER — SODIUM CHLORIDE 0.9 % IV SOLN
25.0000 ug/h | INTRAVENOUS | Status: DC
  Administered 2015-06-05: 25 ug/h via INTRAVENOUS
  Filled 2015-06-05: qty 50

## 2015-06-05 MED ORDER — FENTANYL CITRATE (PF) 250 MCG/5ML IJ SOLN: 1.0000 ug/kg/h | INTRAVENOUS | Status: DC

## 2015-06-05 MED ORDER — SODIUM CHLORIDE 0.9 % IV SOLN: 250.0000 mL | INTRAVENOUS | Status: DC | PRN

## 2015-06-05 MED ORDER — FENTANYL CITRATE (PF) 100 MCG/2ML IJ SOLN: 50.0000 ug | INTRAMUSCULAR | Status: DC | PRN

## 2015-06-05 MED ORDER — HYDRALAZINE HCL 20 MG/ML IJ SOLN
10.0000 mg | INTRAMUSCULAR | Status: DC | PRN
  Administered 2015-06-05: 10 mg via INTRAVENOUS
  Filled 2015-06-05: qty 2
  Filled 2015-06-05: qty 1

## 2015-06-05 MED ORDER — ROCURONIUM BROMIDE 50 MG/5ML IV SOLN
INTRAVENOUS | Status: DC | PRN
  Administered 2015-06-05: 100 mg via INTRAVENOUS

## 2015-06-05 MED ORDER — DEXTROSE-NACL 5-0.9 % IV SOLN
INTRAVENOUS | Status: DC
  Administered 2015-06-05: 75 mL/h via INTRAVENOUS

## 2015-06-05 MED ORDER — HEPARIN SODIUM (PORCINE) 5000 UNIT/ML IJ SOLN: 5000.0000 [IU] | Freq: Three times a day (TID) | INTRAMUSCULAR | Status: DC

## 2015-06-05 MED ORDER — DEXTROSE 50 % IV SOLN
1.0000 | Freq: Once | INTRAVENOUS | Status: AC
  Administered 2015-06-05: 50 mL via INTRAVENOUS

## 2015-06-05 MED ORDER — MIDAZOLAM HCL 2 MG/2ML IJ SOLN: 2.0000 mg | INTRAMUSCULAR | Status: DC | PRN

## 2015-06-05 MED ORDER — INSULIN ASPART 100 UNIT/ML ~~LOC~~ SOLN
0.0000 [IU] | SUBCUTANEOUS | Status: DC
  Administered 2015-06-05 – 2015-06-06 (×2): 1 [IU] via SUBCUTANEOUS

## 2015-06-05 MED ORDER — DEXTROSE 50 % IV SOLN: 1.0000 | Freq: Once | INTRAVENOUS | Status: DC

## 2015-06-05 MED ORDER — CETYLPYRIDINIUM CHLORIDE 0.05 % MT LIQD
7.0000 mL | Freq: Four times a day (QID) | OROMUCOSAL | Status: DC
  Administered 2015-06-05 – 2015-06-06 (×4): 7 mL via OROMUCOSAL

## 2015-06-05 MED ORDER — SODIUM CHLORIDE 0.9 % IV SOLN
1.0000 g | Freq: Once | INTRAVENOUS | Status: DC
  Filled 2015-06-05: qty 10

## 2015-06-05 MED ORDER — CALCIUM CHLORIDE 10 % IV SOLN
INTRAVENOUS | Status: DC | PRN
  Administered 2015-06-05: 1 g via INTRAVENOUS

## 2015-06-05 MED ORDER — NOREPINEPHRINE BITARTRATE 1 MG/ML IV SOLN
0.0000 ug/min | Freq: Once | INTRAVENOUS | Status: AC
  Administered 2015-06-05: 5 ug/min via INTRAVENOUS

## 2015-06-05 MED ORDER — VANCOMYCIN HCL 10 G IV SOLR: 1500.0000 mg | INTRAVENOUS | Status: DC

## 2015-06-05 MED ORDER — VANCOMYCIN HCL 500 MG IV SOLR
500.0000 mg | INTRAVENOUS | Status: DC
  Administered 2015-06-06: 500 mg via INTRAVENOUS
  Filled 2015-06-05: qty 500

## 2015-06-05 MED ORDER — ROCURONIUM BROMIDE 50 MG/5ML IV SOLN
INTRAVENOUS | Status: AC
  Filled 2015-06-05: qty 2

## 2015-06-05 MED ORDER — ETOMIDATE 2 MG/ML IV SOLN
INTRAVENOUS | Status: AC
  Filled 2015-06-05: qty 20

## 2015-06-05 MED ORDER — ATROPINE SULFATE 0.1 MG/ML IJ SOLN
1.0000 mg | Freq: Once | INTRAMUSCULAR | Status: AC
  Administered 2015-06-05: 1 mg via INTRAVENOUS

## 2015-06-05 MED ORDER — INSULIN ASPART 100 UNIT/ML ~~LOC~~ SOLN
2.0000 [IU] | SUBCUTANEOUS | Status: DC
  Administered 2015-06-05: 6 [IU] via SUBCUTANEOUS

## 2015-06-05 MED ORDER — ACETAMINOPHEN 325 MG PO TABS
650.0000 mg | ORAL_TABLET | ORAL | Status: DC | PRN
  Administered 2015-06-07 – 2015-06-09 (×3): 650 mg via ORAL
  Filled 2015-06-05 (×3): qty 2

## 2015-06-05 MED ORDER — FENTANYL CITRATE (PF) 100 MCG/2ML IJ SOLN
50.0000 ug | INTRAMUSCULAR | Status: DC | PRN
  Administered 2015-06-05: 50 ug via INTRAVENOUS
  Administered 2015-06-05 – 2015-06-06 (×2): 100 ug via INTRAVENOUS
  Administered 2015-06-06: 50 ug via INTRAVENOUS
  Filled 2015-06-05 (×4): qty 2

## 2015-06-05 MED ORDER — CHLORHEXIDINE GLUCONATE 0.12 % MT SOLN
15.0000 mL | Freq: Two times a day (BID) | OROMUCOSAL | Status: DC
  Administered 2015-06-05 – 2015-06-07 (×6): 15 mL via OROMUCOSAL
  Filled 2015-06-05 (×8): qty 15

## 2015-06-05 MED ORDER — INSULIN ASPART 100 UNIT/ML ~~LOC~~ SOLN
10.0000 [IU] | Freq: Once | SUBCUTANEOUS | Status: AC
  Administered 2015-06-05: 10 [IU] via INTRAVENOUS

## 2015-06-05 MED ORDER — FENTANYL CITRATE (PF) 100 MCG/2ML IJ SOLN
12.5000 ug | INTRAMUSCULAR | Status: DC | PRN
Start: 2015-06-05 — End: 2015-06-05
  Administered 2015-06-05: 25 ug via INTRAVENOUS
  Filled 2015-06-05: qty 2

## 2015-06-05 MED ORDER — SODIUM CHLORIDE 0.9 % IV SOLN
INTRAVENOUS | Status: DC | PRN
  Administered 2015-06-05 (×2): 1000 mL via INTRAVENOUS

## 2015-06-05 MED ORDER — ONDANSETRON HCL 4 MG/2ML IJ SOLN
4.0000 mg | Freq: Four times a day (QID) | INTRAMUSCULAR | Status: DC | PRN
  Filled 2015-06-05: qty 2

## 2015-06-05 MED ORDER — SUCCINYLCHOLINE CHLORIDE 20 MG/ML IJ SOLN
INTRAMUSCULAR | Status: AC
  Filled 2015-06-05: qty 1

## 2015-06-05 MED ORDER — FAMOTIDINE IN NACL 20-0.9 MG/50ML-% IV SOLN
20.0000 mg | INTRAVENOUS | Status: DC
  Administered 2015-06-05 – 2015-06-06 (×2): 20 mg via INTRAVENOUS
  Filled 2015-06-05: qty 50

## 2015-06-05 MED ORDER — VANCOMYCIN HCL IN DEXTROSE 1-5 GM/200ML-% IV SOLN
1000.0000 mg | Freq: Once | INTRAVENOUS | Status: AC
  Administered 2015-06-05: 1000 mg via INTRAVENOUS
  Filled 2015-06-05: qty 200

## 2015-06-05 NOTE — H&P (Signed)
PULMONARY / CRITICAL CARE MEDICINE   Name: [name] MRN: [id number] DOB: 09-Jun-1925    ADMISSION DATE:  06/05/2015 CONSULTATION DATE:  06/05/2015  REFERRING MD :  Dina Rich, EDP  CHIEF COMPLAINT:  Acute encephalopathy  INITIAL PRESENTATION: 81 y/o female with a history of atrial fib and a recent hospitalization for AKI was admitted on 6/29 from the [facility] ED with bradycardia due to hyperkalemia and acidosis.  STUDIES:  6/29 CT abdomen/pelvis>>  SIGNIFICANT EVENTS:    HISTORY OF PRESENT ILLNESS:  [name] was recently discharged from our facility due to renal failure of uncertain etiology and went home on 6/23 with her family.  During that hospital stay she was treated with mechanical ventilation and hemodialysis briefly. At discharge she was sent home on cardizem in addition to her metoprolol for her Afib.  At home she was initially doing well where she was eating and living in her house with her family keeping a close eye on her.  At home she was caring for herself, showering and taking care of her own activities of daily living with some assistance from family. However, on 06/04/2015 she developed mild cramping abdominal pain and was unable to eat any more than soup during the course of the day. She did take her medications. Over the course of the evening she became more confused and eventually her family noted that she seemed to have cyanosis of her fingers and toes. So they called 911 and she was brought in by EMS for she is found to have markedly bradycardia. In the emergency department she was intubated and briefly required norepinephrine.  PAST MEDICAL HISTORY :   has a past medical history of OSA (obstructive sleep apnea); Atrial fibrillation; Myocardial infarct; Diabetes mellitus; Hearing loss; Hypertension; Hyperlipidemia; Senile degeneration of brain; Other generalized ischemic cerebrovascular disease; Personal history of fall; Pain in joint, shoulder region; Personality change  due to conditions classified elsewhere; Loss of weight; Chronic kidney disease, stage II (mild); Obstructive sleep apnea (adult) (pediatric); Senile degeneration of brain; Dizziness and giddiness; Personal history of noncompliance with medical treatment, presenting hazards to health; Dyskinesia of esophagus; Shortness of breath; Chest pain, unspecified (06/19/2009); Long term (current) use of anticoagulants; Unspecified hereditary and idiopathic peripheral neuropathy; Pain in joint, site unspecified; Carpal tunnel syndrome; Other hammer toe (acquired); Alopecia areata; Dysphagia, unspecified(787.20); Occlusion and stenosis of carotid artery without mention of cerebral infarction; Other symptoms involving cardiovascular system; Other acne; Allergic rhinitis, cause unspecified; Type II or unspecified type diabetes mellitus without mention of complication, uncontrolled; Varicose veins of lower extremities; Cervicalgia; Disturbance of salivary secretion; Insomnia, unspecified; Unspecified hypothyroidism; Palpitations (715.90); Other malaise and fatigue; Diverticulosis of colon (without mention of hemorrhage); Benign neoplasm of colon; Depressive disorder, not elsewhere classified; Syncope and collapse; Paroxysmal atrial fibrillation; Hypertension; and Dizziness.  has past surgical history that includes Total abdominal hysterectomy; Tonsillectomy; Appendectomy; Hand surgery; Cataract OD (10/04/2012); Cataract OS (01/31/2013); CAROTID DOPPLER (07/25/2012); and transthoracic echocardiogram (04/24/2011). Prior to Admission medications   Medication Sig Start Date End Date Taking? Authorizing Provider  diltiazem (CARDIZEM CD) 180 MG 24 hr capsule Take 1 capsule (180 mg total) by mouth daily. 05/30/15   Verlee Monte, MD  estradiol (ESTRACE) 0.5 MG tablet TAKE 1 TABLET EVERY DAY FOR HORMONES 11/20/14   Estill Dooms, MD  hydroxypropyl methylcellulose (ISOPTO TEARS) 2.5 % ophthalmic solution Place 1 drop into both eyes as  needed for dry eyes.    Historical Provider, MD  levothyroxine (SYNTHROID, LEVOTHROID) 75 MCG tablet Take  1 tablet (75 mcg total) by mouth daily before breakfast. 05/30/15   Verlee Monte, MD  metFORMIN (GLUCOPHAGE) 500 MG tablet TAKE 2 TABLETS BY MOUTH EVERY MORNING AND 1 TAB AT SUPPER TO CONTROL BLOOD SUGAR 05/20/15   Estill Dooms, MD  metoprolol (LOPRESSOR) 50 MG tablet TAKE 1 TABLET BY MOUTH TWICE A DAY FOR BLOOD PRESSURE 04/22/15   Estill Dooms, MD  spironolactone (ALDACTONE) 25 MG tablet TAKE 1 TABLET BY MOUTH TWICE A DAY TO STRENGTHEN THE HEART AND REGULATE BLOOD PRESSURE 12/25/14   Estill Dooms, MD  warfarin (COUMADIN) 5 MG tablet Take 1 to 1.5 tablets by mouth daily as directed by coumadin clinic Patient taking differently: Take 5-7.5 mg by mouth daily. Take 7.5mg  on Monday, Wednesday, and Fridays. Take 5mg  on all other days of the week. 05/03/15   Lorretta Harp, MD   No Known Allergies  FAMILY HISTORY:  indicated that her mother is deceased. She indicated that her father is deceased. She indicated that only one of her three brothers is alive. She indicated that both of her daughters are alive. She indicated that both of her sons are alive.  SOCIAL HISTORY:  reports that she has never smoked. She does not have any smokeless tobacco history on file. She reports that she does not drink alcohol or use illicit drugs.  REVIEW OF SYSTEMS:  Cannot obtain due to intubation  SUBJECTIVE:   VITAL SIGNS: Temp:  [91 F (32.8 C)-92.6 F (33.7 C)] 91 F (32.8 C) (06/29 0540) Pulse Rate:  [38-71] 58 (06/29 0540) Resp:  [14-27] 14 (06/29 0540) BP: (96-164)/(40-116) 114/54 mmHg (06/29 0540) SpO2:  [81 %-100 %] 98 % (06/29 0542) FiO2 (%):  [80 %] 80 % (06/29 0539) Weight:  [54.432 kg (120 lb)] 54.432 kg (120 lb) (06/29 0423) HEMODYNAMICS:   VENTILATOR SETTINGS: Vent Mode:  [-] PRVC FiO2 (%):  [80 %] 80 % Set Rate:  [14 bmp] 14 bmp Vt Set:  [500 mL] 500 mL PEEP:  [5 cmH20] 5  cmH20 Plateau Pressure:  [19 cmH20] 19 cmH20 INTAKE / OUTPUT:  Intake/Output Summary (Last 24 hours) at 06/05/15 0616 Last data filed at 06/05/15 9390  Gross per 24 hour  Intake    700 ml  Output      0 ml  Net    700 ml    PHYSICAL EXAMINATION: General:  Elderly female on vent Neuro:  Sedated on vent stirs to touch, does not follow commands HEENT:  Pupils equal round reactive to light, ET tube in place Cardiovascular:  Bradycardic rhythm irregularly irregular, no murmurs gallops or rubs Lungs:  Clear to auscultation ventilatory effort Abdomen:  Distended bowel sounds infrequent, soft Musculoskeletal:  Decreased bulk and tone as expected for age Skin:  No rash or skin breakdown  LABS:  CBC  Recent Labs Lab 05/30/15 0316 06/05/15 0427 06/05/15 0438  WBC 14.8* 13.0*  --   HGB 10.4* 10.3* 11.6*  HCT 30.6* 31.7* 34.0*  PLT 318 162  --    Coag's  Recent Labs Lab 05/30/15 0316 06/03/15 06/05/15 0427  INR 2.08* 2.2 4.74*   BMET  Recent Labs Lab 05/30/15 0316 06/05/15 0427 06/05/15 0438  NA 140 131* 132*  K 3.2* 5.8* 5.7*  CL 99* 102 105  CO2 29 9*  --   BUN 23* 16 19  CREATININE 1.33* 1.73* 1.50*  GLUCOSE 96 399* 394*   Electrolytes  Recent Labs Lab 05/30/15 0316 06/05/15 0427  CALCIUM 8.7* 7.8*  Sepsis Markers  Recent Labs Lab 06/05/15 0438  LATICACIDVEN 10.74*   ABG  Recent Labs Lab 06/05/15 0519  PHART 7.273*  PCO2ART 31.6*  PO2ART 102.0*   Liver Enzymes  Recent Labs Lab 06/05/15 0427  AST 905*  ALT 441*  ALKPHOS 338*  BILITOT 0.9  ALBUMIN 2.5*   Cardiac Enzymes No results for input(s): TROPONINI, PROBNP in the last 168 hours. Glucose  Recent Labs Lab 05/29/15 1635 05/29/15 2150 05/30/15 0630 05/30/15 1138 06/05/15 0431 06/05/15 0548  GLUCAP 330* 81 102* 145* 299* 337*    Imaging Dg Chest Port 1 View  06/05/2015   CLINICAL DATA:  Dyspnea.  Intubation.  EXAM: PORTABLE CHEST - 1 VIEW  COMPARISON:  06/05/2015 at  04:49  FINDINGS: The endotracheal tube is 4.5 cm above the carina. The nasogastric tube extends into the stomach. There is no interval change in the hazy opacities bilaterally, consistent with alveolar edema and/or effusions. There is no pneumothorax.  IMPRESSION: Support equipment appears satisfactorily positioned.  No significant interval change in the bilateral airspace opacities.   Electronically Signed   By: Andreas Newport M.D.   On: 06/05/2015 06:00   Dg Chest Portable 1 View  06/05/2015   CLINICAL DATA:  Bradycardia and hypotension. History of diabetes, hypertension, myocardial infarction, dementia.  EXAM: PORTABLE CHEST - 1 VIEW  COMPARISON:  Chest radiograph May 28, 2015  FINDINGS: The cardiac silhouette appears mildly enlarged, similar. Tortuous calcified aorta. Hazy densities in lung bases favor layering pleural effusion. Similar pulmonary vascular congestion mild interstitial prominence. No pneumothorax. Pacer pad overlies the central and lower chest. Multiple EKG lines overlie the patient and may obscure subtle underlying pathology. Soft tissue planes and included osseous structures are nonsuspicious. Mild vascular calcifications RIGHT axilla.  IMPRESSION: Mild cardiomegaly, layering pleural effusions with mild interstitial prominence favoring pulmonary edema.   Electronically Signed   By: Elon Alas M.D.   On: 06/05/2015 05:35     ASSESSMENT / PLAN:  PULMONARY OETT June 29>> A: Acute respiratory failure with hypoxemia in the setting of profound bradycardia Likely aspiration pneumonitis P:   Full vent support Daily chest x-ray Daily ABG SBT daily See ID  [email or web address]  A: Profound bradycardia with hypotension due to metabolic acidosis and hyperkalemia, unclear if medications at home were contributing> both now improved Atrial fibrillation, recent addition of Cardizem in addition to metoprolol P:  Hold home Metoprolol and Cardizem Telemetry monitoring Monitor blood  pressure in ICU setting Restart vasopressin as if needed for MAP greater than 65 Continue IV fluids May need central line  RENAL A:  Acute kidney injury with metabolic acidosis and hyperkalemia, etiology uncertain Lactic acidosis, etiology uncertain, worrisome for gut ischemia P:   Monitor basic metabolic panel closely Monitor urine output Repeat lactate Check urinalysis  GASTROINTESTINAL A:  Abdominal pain with lactic acidosis and history of vascular disease and atrial fibrillation, picture worrisome for ischemic bowel P:   CT abdomen pelvis stat Nothing by mouth OG tube  HEMATOLOGIC A:  Chronic anticoagulation for atrial fibrillation P:  Monitor for bleeding Hold warfarin for now  INFECTIOUS A:  Aspiration pneumonitis Possible sepsis of an intra-abdominal source> consider get ischemia with bacteria translocation P:   BCx2 June 29 > UC June 29 > Sputum June 29>  Vancomycin June 29> Zosyn June 29>  ENDOCRINE A:  Hyperglycemia with history of diabetes   P:   Check serum ketones IV fluids ICU hyperglycemia protocol  NEUROLOGIC A:  Acute encephalopathy in setting of acidosis  and shock P:   RASS goal: -1 Intermittent fentanyl per PAD protocol Frequent neuro checks   FAMILY  - Updates: Son updated by phone 06/05/2015 a.m. He desires full code for now.  - Inter-disciplinary family meet or Palliative Care meeting due by:  July 6    TODAY'S SUMMARY: 81 year old lady with a recent admission for acute kidney injury return to our hospital on June 29 with metabolic acidosis and acute kidney injury leading to profound bradycardia. The acidosis now seems to be improving and her bradycardia has improved, underlying etiology still uncertain. The picture is very worrisome for ischemic bowel considering the abdominal pain and anorexia one day prior to admission. Plan admit to ICU, full vent support, CT abdomen pelvis to look for possible ischemic bowel.    My cc time 60  minutes    Roselie Awkward, MD [facility] Pager: [phone] Cell: [phone] After 3pm or if no response, call [phone]  06/05/2015, 6:16 AM

## 2015-06-05 NOTE — ED Notes (Signed)
Pt placed on Zoll 

## 2015-06-05 NOTE — Significant Event (Signed)
Not able to get respiratory culture. RN suctioned patient twice, no secretions at all at this time. Will attempt later again.

## 2015-06-05 NOTE — Significant Event (Signed)
Patient started to wake up, noted purposeful movements to lower extremities greater than upper extremities. Patient nodes/shakes head to questions. Not able to open eyes upon commands. Spoke with Dr. Lafayette Dragon MD, can discontinued CT scan ordered today.

## 2015-06-05 NOTE — Progress Notes (Signed)
Pt arrived to 2S05. Dayshift RT there upon arrival. Report received.

## 2015-06-05 NOTE — ED Provider Notes (Signed)
CSN: [id number]     Arrival date & time 06/05/15  0415 History   First MD Initiated Contact with Patient 06/05/15 [phone]     Chief Complaint  Patient presents with  . Bradycardia  . Hypotension     (Consider location/radiation/quality/duration/timing/severity/associated sxs/prior Treatment) HPI  This is an 81 year old female with history of atrial fibrillation, MI, diabetes, hypertension, hyperlipidemia, chronic kidney disease who presents with bradycardia. Per EMS report, they were called for nausea, vomiting 1 day. Patient was noted to be hypotensive and bradycardic. Initial blood pressure was 70/36 and heart rate of 26. Patient was given fluids with increase in blood pressure to 96/64.  Patient is somewhat disoriented and unable to provide any history upon arrival.  Of note, recent admission for acute kidney injury and hyperkalemia. She had a similar presentation with hypertension and bradycardia. At that time creatinine was greater than 8 as was her potassium. This was thought to be medication induced and she was taken off her ACE inhibitor and switch to calcium channel blocker.  Level V caveat  Past Medical History  Diagnosis Date  . OSA (obstructive sleep apnea)     AHI-44/hr, AHI during REM 61.8/hr, avg O2 during REM and NREM 96%  . Atrial fibrillation   . Myocardial infarct   . Diabetes mellitus   . Hearing loss   . Hypertension   . Hyperlipidemia   . Senile degeneration of brain   . Other generalized ischemic cerebrovascular disease   . Personal history of fall   . Pain in joint, shoulder region   . 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 Loss of weight   . Chronic kidney disease, stage II (mild)   . Obstructive sleep apnea (adult) (pediatric)   . Senile degeneration of brain   . Dizziness and giddiness   . Personal history of noncompliance with medical treatment, presenting hazards to health   . Dyskinesia of esophagus   . Shortness of breath    . Chest pain, unspecified 06/19/2009  . Long term (current) use of anticoagulants   . Unspecified hereditary and idiopathic peripheral neuropathy   . Pain in joint, site unspecified   . Carpal tunnel syndrome   . Other hammer toe (acquired)   . Alopecia areata   . Dysphagia, unspecified(787.20)   . Occlusion and stenosis of carotid artery without mention of cerebral infarction   . Other symptoms involving cardiovascular system   . Other acne   . Allergic rhinitis, cause unspecified   . Type II or unspecified type diabetes mellitus without mention of complication, uncontrolled   . Varicose veins of lower extremities   . Cervicalgia   . Disturbance of salivary secretion   . Insomnia, unspecified   . Unspecified hypothyroidism   . Palpitations 715.90  . Other malaise and fatigue   . Diverticulosis of colon (without mention of hemorrhage)   . Benign neoplasm of colon   . Depressive disorder, not elsewhere classified   . Syncope and collapse   . Paroxysmal atrial fibrillation   . Hypertension   . Dizziness    Past Surgical History  Procedure Laterality Date  . Total abdominal hysterectomy    . Tonsillectomy    . Appendectomy    . Hand surgery    . Cataract od  10/04/2012  . Cataract os  01/31/2013  . Carotid doppler  07/25/2012    Bilat internal carotid arteries demonstrate 1-395 stenoses.  . Transthoracic echocardiogram  04/24/2011    EF >  55%, moderate tricuspid regurg.   Family History  Problem Relation Age of Onset  . Cervical cancer Mother   . Cancer Mother   . Parkinsonism Father   . Sleep apnea Brother   . Diabetes Brother   . Sleep apnea Brother   . Peripheral vascular disease Brother   . Cancer Daughter    History  Substance Use Topics  . Smoking status: Never Smoker   . Smokeless tobacco: Not on file  . Alcohol Use: No   OB History    No data available     Review of Systems  Unable to perform ROS: Acuity of condition      Allergies  Review of  patient's allergies indicates no known allergies.  Home Medications   Prior to Admission medications   Medication Sig Start Date End Date Taking? Authorizing Provider  diltiazem (CARDIZEM CD) 180 MG 24 hr capsule Take 1 capsule (180 mg total) by mouth daily. 05/30/15   Verlee Monte, MD  estradiol (ESTRACE) 0.5 MG tablet TAKE 1 TABLET EVERY DAY FOR HORMONES 11/20/14   Estill Dooms, MD  hydroxypropyl methylcellulose (ISOPTO TEARS) 2.5 % ophthalmic solution Place 1 drop into both eyes as needed for dry eyes.    Historical Provider, MD  levothyroxine (SYNTHROID, LEVOTHROID) 75 MCG tablet Take 1 tablet (75 mcg total) by mouth daily before breakfast. 05/30/15   Verlee Monte, MD  metFORMIN (GLUCOPHAGE) 500 MG tablet TAKE 2 TABLETS BY MOUTH EVERY MORNING AND 1 TAB AT SUPPER TO CONTROL BLOOD SUGAR 05/20/15   Estill Dooms, MD  metoprolol (LOPRESSOR) 50 MG tablet TAKE 1 TABLET BY MOUTH TWICE A DAY FOR BLOOD PRESSURE 04/22/15   Estill Dooms, MD  spironolactone (ALDACTONE) 25 MG tablet TAKE 1 TABLET BY MOUTH TWICE A DAY TO STRENGTHEN THE HEART AND REGULATE BLOOD PRESSURE 12/25/14   Estill Dooms, MD  warfarin (COUMADIN) 5 MG tablet Take 1 to 1.5 tablets by mouth daily as directed by coumadin clinic Patient taking differently: Take 5-7.5 mg by mouth daily. Take 7.5mg  on Monday, Wednesday, and Fridays. Take 5mg  on all other days of the week. 05/03/15   Lorretta Harp, MD   BP 114/54 mmHg  Pulse 58  Temp(Src) 91 F (32.8 C) (Rectal)  Resp 14  Ht 5\' 7"  (1.702 m)  Wt 120 lb (54.432 kg)  BMI 18.79 kg/m2  SpO2 98% Physical Exam  Constitutional:  Elderly, ill-appearing, moaning  HENT:  Head: Normocephalic and atraumatic.  Eyes: Pupils are equal, round, and reactive to light.  Pupils 3 mm reactive  Cardiovascular: Regular rhythm and normal heart sounds.   Bradycardia  Pulmonary/Chest: Effort normal. No respiratory distress.  Abdominal: Soft. There is tenderness. There is no rebound and no guarding.   Musculoskeletal: She exhibits no edema.  Neurological:  Intermittently arousable, bloating, at times will answer directed questions but in general is just moaning, moves all 4 extremities  Skin: Skin is dry.  Cool  Psychiatric: She has a normal mood and affect.  Nursing note and vitals reviewed.   ED Course  Procedures (including critical care time)  CRITICAL CARE Performed by: Merryl Hacker   Total critical care time: 100 min  Critical care time was exclusive of separately billable procedures and treating other patients.  Critical care was necessary to treat or prevent imminent or life-threatening deterioration.  Critical care was time spent personally by me on the following activities: development of treatment plan with patient and/or surrogate as well as nursing,  discussions with consultants, evaluation of patient's response to treatment, examination of patient, obtaining history from patient or surrogate, ordering and performing treatments and interventions, ordering and review of laboratory studies, ordering and review of radiographic studies, pulse oximetry and re-evaluation of patient's condition.   INTUBATION Performed by: Merryl Hacker  Required items: required blood products, implants, devices, and special equipment available Patient identity confirmed: provided demographic data and hospital-assigned identification number Time out: Immediately prior to procedure a "time out" was called to verify the correct patient, procedure, equipment, support staff and site/side marked as required.  Indications: hypoxia, altered mental status  Intubation method: Glidescope Laryngoscopy   Preoxygenation: NRB  Sedatives: Etomidate Paralytic: Succinylcholine  Tube Size: 7.5 cuffed  Post-procedure assessment: chest rise and ETCO2 monitor Breath sounds: equal and absent over the epigastrium Tube secured with: ETT holder Chest x-ray interpreted by radiologist and  me.  Chest x-ray findings: endotracheal tube in appropriate position  Patient tolerated the procedure well with no immediate complications.    Labs Review Labs Reviewed  CBC WITH DIFFERENTIAL/PLATELET - Abnormal; Notable for the following:    WBC 13.0 (*)    RBC 3.06 (*)    Hemoglobin 10.3 (*)    HCT 31.7 (*)    MCV 103.6 (*)    RDW 15.9 (*)    Neutro Abs 9.4 (*)    All other components within normal limits  LIPASE, BLOOD - Abnormal; Notable for the following:    Lipase 97 (*)    All other components within normal limits  COMPREHENSIVE METABOLIC PANEL - Abnormal; Notable for the following:    Sodium 131 (*)    Potassium 5.8 (*)    CO2 9 (*)    Glucose, Bld 399 (*)    Creatinine, Ser 1.73 (*)    Calcium 7.8 (*)    Total Protein 4.8 (*)    Albumin 2.5 (*)    AST 905 (*)    ALT 441 (*)    Alkaline Phosphatase 338 (*)    GFR calc non Af Amer 25 (*)    GFR calc Af Amer 29 (*)    Anion gap 20 (*)    All other components within normal limits  PROTIME-INR - Abnormal; Notable for the following:    Prothrombin Time 43.2 (*)    INR 4.74 (*)    All other components within normal limits  URINALYSIS, ROUTINE W REFLEX MICROSCOPIC (NOT AT [facility]) - Abnormal; Notable for the following:    APPearance CLOUDY (*)    Protein, ur 30 (*)    All other components within normal limits  URINE MICROSCOPIC-ADD ON - Abnormal; Notable for the following:    Squamous Epithelial / LPF MANY (*)    Bacteria, UA FEW (*)    All other components within normal limits  I-STAT CHEM 8, ED - Abnormal; Notable for the following:    Sodium 132 (*)    Potassium 5.7 (*)    Creatinine, Ser 1.50 (*)    Glucose, Bld 394 (*)    Calcium, Ion 1.06 (*)    Hemoglobin 11.6 (*)    HCT 34.0 (*)    All other components within normal limits  I-STAT CG4 LACTIC ACID, ED - Abnormal; Notable for the following:    Lactic Acid, Venous 10.74 (*)    All other components within normal limits  CBG MONITORING, ED - Abnormal;  Notable for the following:    Glucose-Capillary 299 (*)    All other components within normal  limits  I-STAT ARTERIAL BLOOD GAS, ED - Abnormal; Notable for the following:    pH, Arterial 7.273 (*)    pCO2 arterial 31.6 (*)    pO2, Arterial 102.0 (*)    Bicarbonate 15.2 (*)    Acid-base deficit 11.0 (*)    All other components within normal limits  CBG MONITORING, ED - Abnormal; Notable for the following:    Glucose-Capillary 337 (*)    All other components within normal limits  CULTURE, BLOOD (ROUTINE X 2)  CULTURE, BLOOD (ROUTINE X 2)  URINE CULTURE  BLOOD GAS, ARTERIAL  I-STAT TROPOININ, ED    Imaging Review Dg Chest Port 1 View  06/05/2015   CLINICAL DATA:  Dyspnea.  Intubation.  EXAM: PORTABLE CHEST - 1 VIEW  COMPARISON:  06/05/2015 at 04:49  FINDINGS: The endotracheal tube is 4.5 cm above the carina. The nasogastric tube extends into the stomach. There is no interval change in the hazy opacities bilaterally, consistent with alveolar edema and/or effusions. There is no pneumothorax.  IMPRESSION: Support equipment appears satisfactorily positioned.  No significant interval change in the bilateral airspace opacities.   Electronically Signed   By: Andreas Newport M.D.   On: 06/05/2015 06:00   Dg Chest Portable 1 View  06/05/2015   CLINICAL DATA:  Bradycardia and hypotension. History of diabetes, hypertension, myocardial infarction, dementia.  EXAM: PORTABLE CHEST - 1 VIEW  COMPARISON:  Chest radiograph May 28, 2015  FINDINGS: The cardiac silhouette appears mildly enlarged, similar. Tortuous calcified aorta. Hazy densities in lung bases favor layering pleural effusion. Similar pulmonary vascular congestion mild interstitial prominence. No pneumothorax. Pacer pad overlies the central and lower chest. Multiple EKG lines overlie the patient and may obscure subtle underlying pathology. Soft tissue planes and included osseous structures are nonsuspicious. Mild vascular calcifications RIGHT  axilla.  IMPRESSION: Mild cardiomegaly, layering pleural effusions with mild interstitial prominence favoring pulmonary edema.   Electronically Signed   By: Elon Alas M.D.   On: 06/05/2015 05:35     EKG Interpretation   Date/Time:  Wednesday June 05 2015 04:26:48 EDT Ventricular Rate:  48 PR Interval:    QRS Duration: 87 QT Interval:  669 QTC Calculation: 598 R Axis:   26 Text Interpretation:  Accelerated junctional rhythm Low voltage, extremity  and precordial leads Borderline ST depression, inferior leads ST  elevation, consider anterolateral injury Prolonged QT interval Baseline  wander in lead(s) V6 Confirmed by HORTON  MD, COURTNEY ([id number]) on  06/05/2015 6:00:03 AM      MDM   Final diagnoses:  Septic shock  Hyperkalemia  Bradycardia  Transaminitis    Patient presents with bradycardia and hypotension. Patient was placed on the sole immediately.  Patient was immediately given 1 amp of D50 and 10 units of insulin for presumed hyperkalemia versus calcium channel blockade.  She transiently responded. She was subsequently given 2 A of bicarbonate, another amp of D50 and 10 units of insulin, and calcium chloride. Bradycardia improved and rate was in the 70s. Mentation also improved.  Patient was noted to be profoundly hypothermic with a temperature of 92.6. Sepsis workup initiated as well.  Results include a lactate of 10, potassium of 5.7, creatinine of 1.5, elevated LFTs with an AST of 95, ALT of 441, alkaline phosphatase of 338, patient's INR is 4.74. Patient showing evidence of shock liver and liver dysfunction. This may be related to hypotension. Patient did transiently need Levophed for low blood pressures. She was given 2 L of fluid and broad-spectrum  antibiotics. Given waxing and waning mental status and hypoxia, patient was intubated.  Patient reported that she wished to be full code. Discussed with critical care.     Merryl Hacker, MD 06/05/15 [phone]

## 2015-06-05 NOTE — Progress Notes (Addendum)
PULMONARY / CRITICAL CARE MEDICINE   Name: [name] MRN: [id number] DOB: 11-28-1925    ADMISSION DATE:  06/05/2015 CONSULTATION DATE:  06/05/2015  REFERRING MD :  Dina Rich, EDP  CHIEF COMPLAINT:  Acute encephalopathy  INITIAL PRESENTATION:  81 y/o female with a history of atrial fib and a recent hospitalization for AKI was admitted on 6/29 from the [facility] ED with bradycardia due to hyperkalemia and acidosis.  MAJOR EVENTS/TEST RESULTS: 6/29 CTAP: There is a loop of mildly distended fluid filled small bowel in the central pelvis with associated small bowel feces sign. Interloop mesenteric fluid is associated. Given the configuration, closed loop obstruction is a consideration although this is difficult to confirm without oral or IV contrast. Distention from early mechanical small bowel obstruction would also be a consideration  INDWELLING DEVICES:: ETT 6/29 >>   MICRO DATA: Urine 6/29 >>  Resp 6/29 >>  Blood 6/29 >>    ANTIMICROBIALS:  Vanc 6/29 >>  Pip-tazo 6/29 >>    SUBJECTIVE:  Unresponsive to deep pain on initial eval by me. Later, RN reports MAEs and F/C  VITAL SIGNS: Temp:  [91 F (32.8 C)-97.2 F (36.2 C)] 96.8 F (36 C) (06/29 1200) Pulse Rate:  [38-86] 85 (06/29 1200) Resp:  [14-27] 23 (06/29 1200) BP: (96-190)/(40-116) 152/72 mmHg (06/29 1200) SpO2:  [81 %-100 %] 98 % (06/29 1200) FiO2 (%):  [40 %-90 %] 40 % (06/29 1200) Weight:  [54.432 kg (120 lb)] 54.432 kg (120 lb) (06/29 0423) HEMODYNAMICS:   VENTILATOR SETTINGS: Vent Mode:  [-] PRVC FiO2 (%):  [40 %-90 %] 40 % Set Rate:  [14 bmp] 14 bmp Vt Set:  [500 mL] 500 mL PEEP:  [5 cmH20] 5 cmH20 Plateau Pressure:  [19 cmH20] 19 cmH20 INTAKE / OUTPUT:  Intake/Output Summary (Last 24 hours) at 06/05/15 1329 Last data filed at 06/05/15 1300  Gross per 24 hour  Intake 4514.39 ml  Output    480 ml  Net 4034.39 ml    PHYSICAL EXAMINATION: General:  RASS -5, not F/C Neuro: Pupils react, corneals  absent, no spont movements, no withdrawal, DTRs symmetric HEENT: NCAT, WNL Cardiovascular: Reg, no M Lungs:  Clear anteriorly Abdomen: soft, +BS Ext: no edema, warm  LABS:  CBC  Recent Labs Lab 05/30/15 0316 06/05/15 0427 06/05/15 0438  WBC 14.8* 13.0*  --   HGB 10.4* 10.3* 11.6*  HCT 30.6* 31.7* 34.0*  PLT 318 162  --    Coag's  Recent Labs Lab 06/03/15 06/05/15 0427 06/05/15 1230  INR 2.2 4.74* 4.15*   BMET  Recent Labs Lab 05/30/15 0316 06/05/15 0427 06/05/15 0438 06/05/15 1007  NA 140 131* 132* 138  K 3.2* 5.8* 5.7* 4.4  CL 99* 102 105 105  CO2 29 9*  --  20*  BUN 23* 16 19 18   CREATININE 1.33* 1.73* 1.50* 1.58*  GLUCOSE 96 399* 394* 159*   Electrolytes  Recent Labs Lab 05/30/15 0316 06/05/15 0427 06/05/15 1007  CALCIUM 8.7* 7.8* 9.3   Sepsis Markers  Recent Labs Lab 06/05/15 0438 06/05/15 0900 06/05/15 1007  LATICACIDVEN 10.74* 3.5*  --   PROCALCITON  --   --  0.45   ABG  Recent Labs Lab 06/05/15 0519  PHART 7.273*  PCO2ART 31.6*  PO2ART 102.0*   Liver Enzymes  Recent Labs Lab 06/05/15 0427  AST 905*  ALT 441*  ALKPHOS 338*  BILITOT 0.9  ALBUMIN 2.5*   Cardiac Enzymes  Recent Labs Lab 06/05/15 1007  TROPONINI <  0.03   Glucose  Recent Labs Lab 05/30/15 0630 05/30/15 1138 06/05/15 0431 06/05/15 0548 06/05/15 0749 06/05/15 1201  GLUCAP 102* 145* 299* 337* 224* 119*    CXR: NACPD    ASSESSMENT / PLAN:  PULMONARY A:  VDRF due to AMS P:   Cont full vent support - settings reviewed and/or adjusted Cont vent bundle Daily SBT if/when meets criteria  CARDIOVASCULAR  A:  Profound bradycardia due to hyperkalemia PAF > NSR Hypotension, resolved Lactic acidosis - resolving Hypertension P:  Holding Metoprolol and Cardizem Cont telemetry monitoring DC vasopressors PRN hydralazine to maintain SBP < 170 mmHg  RENAL A:   AKI, nonoliguric Metabolic acidosis Hyperkalemia - resolved P:   Monitor  BMET intermittently Monitor I/Os Correct electrolytes as indicated  GASTROINTESTINAL A:   Abdominal pain  Concern for ischemic bowel P:   SUP: IV famotidine Consider nutrition 6/30  HEMATOLOGIC A:   Prolonged prothrombin time due to warfarin P:  DVT px: warfarin Monitor CBC intermittently Transfuse per usual ICU guidelines  INFECTIOUS A:   Severe sepsis - no clear source. Bacterial translocation P:   Micro and abx as above  ENDOCRINE A:  DM 2 P:   Cont SSI  NEUROLOGIC A:   Acute encephalopathy P:   RASS goal: -1 Intermittent fentanyl per PAD protocol Frequent neuro checks   FAMILY  Updates: Son in person 6/29   CCM time 69 minutes    Merton Border, MD ; [facility] service Mobile [phone].  After 5:30 PM or weekends, call [phone]   06/05/2015, 1:29 PM   ADD: I discussed goals of care with son. We agree that she is not a candidate for prolonged mechanical ventilation, major surgical intervention. Further HD or ACLS. I expressed optimism, however, that she might be extubated expeditiously and still have a good recovery  Merton Border, MD ; [facility] service Mobile [phone].  After 5:30 PM or weekends, call [phone]

## 2015-06-05 NOTE — Progress Notes (Signed)
Elink: 5:17 PM @ 06/05/2015   eLink Physician-Brief Progress Note Patient Name: [name] DOB: Oct 13, 1925 MRN: [id number]  Date of Service  06/05/2015   HPI/Events of Note   Patient now waking up and agitated  eICU Interventions  Increase fent prn dosage      Geremy Rister 06/05/2015, 5:17 PM

## 2015-06-05 NOTE — Significant Event (Addendum)
0900-RN sent blood sample for lactic acid, troponin, BMP, CBC, INR, cortisol, procalcitonin, beta-hydroxybutyric acid.

## 2015-06-05 NOTE — Progress Notes (Signed)
Note to MD: Pt's son, Alla Sloma, would like consult with MD to make sure dosages of home BP meds are appropriate for pt. Not currently on her home BP meds inpatient.  Soul Hackman S. Alford Highland, PharmD, [facility] Clinical Staff Pharmacist Pager [phone]

## 2015-06-05 NOTE — Telephone Encounter (Signed)
Rx(s) sent to pharmacy electronically.  

## 2015-06-05 NOTE — Progress Notes (Signed)
Initial Nutrition Assessment  DOCUMENTATION CODES:  Non-severe (moderate) malnutrition in context of chronic illness  INTERVENTION:   (Recommend pt start enteral nutrition)  Vital AF 1.2 @ 15 ml/hr and increase by 10 ml every 4 hours to goal rate of 35 ml/hr.  30 ml Prostat daily Provides: 1108 kcal, 78 grams protein, and 681 ml H2O.   NUTRITION DIAGNOSIS:  Malnutrition related to chronic illness as evidenced by moderate depletions of muscle mass, moderate depletion of body fat.   GOAL:  Patient will meet greater than or equal to 90% of their needs   MONITOR:  Vent status, Labs, Weight trends, I & O's, Skin  REASON FOR ASSESSMENT:  Ventilator    ASSESSMENT:  81 y/o female with a history of atrial fib and a recent hospitalization for AKI was admitted on 6/29 from the [facility] ED with bradycardia due to hyperkalemia and acidosis. Concern for sepsis Per CT pt with possible closed loop obstruction vs early mechanical small bowel obstruction.  MD considering starting enteral nutrition 6/30.   Patient is currently intubated on ventilator support MV: 9.4 L/min Temp (24hrs), Avg:95.7 F (35.4 C), Min:91 F (32.8 C), Max:97.7 F (36.5 C)  OG tube to low intermittent suction Nutrition-Focused physical exam completed. Findings are mild/moderate fat depletion, mild/moderate muscle depletion, and moderate hand edema.  Labs and medications reviewed.    Height:  Ht Readings from Last 1 Encounters:  06/05/15 5\' 7"  (1.702 m)    Weight:  Wt Readings from Last 1 Encounters:  06/05/15 120 lb (54.432 kg)    Ideal Body Weight:  56.8 kg  Wt Readings from Last 10 Encounters:  06/05/15 120 lb (54.432 kg)  05/30/15 129 lb 1.6 oz (58.559 kg)  04/03/15 126 lb 3.2 oz (57.244 kg)  01/09/15 126 lb (57.153 kg)  11/20/14 122 lb 3.2 oz (55.43 kg)  07/23/14 123 lb 6.4 oz (55.974 kg)  07/17/14 122 lb 3.2 oz (55.43 kg)  04/18/14 124 lb (56.246 kg)  12/12/13 121 lb 12.8 oz (55.248 kg)   10/26/13 122 lb (55.339 kg)    BMI:  Body mass index is 18.79 kg/(m^2).  Estimated Nutritional Needs:  Kcal:  1121  Protein:  75-90  Fluid:  > 1.5 L/day  Skin:  Reviewed, no issues  Diet Order:  Diet NPO time specified  EDUCATION NEEDS:  No education needs identified at this time   Intake/Output Summary (Last 24 hours) at 06/05/15 1518 Last data filed at 06/05/15 1400  Gross per 24 hour  Intake 4589.39 ml  Output    915 ml  Net 3674.39 ml    Last BM:  6/29  [place], [place], [place] Pager [phone] After Hours Pager

## 2015-06-05 NOTE — Significant Event (Signed)
Patient very agitated, actively reaching to pull on ETT tube. Mittens on bilateral hands. Patient responsive to verbal commands but will reach for tube even with staff holding her hands. Spoke with MD in [place]. New orders for increase of sedation.

## 2015-06-05 NOTE — ED Notes (Signed)
Per EMS, pt comes from home c/o new onset generalized abd pain, N/V.  Upon EMS arrival, pt hypotensive and bradycardic (70/36 initial BP and HR 26).  Pt given 537ml NS bolus by EMS, BP increased to 96/64. HR 25 upon arrival to ED, pt responds to voice and disoriented to situation and time.

## 2015-06-05 NOTE — ED Notes (Signed)
Critical care at BS.  

## 2015-06-05 NOTE — Progress Notes (Signed)
eLink Physician-Brief Progress Note Patient Name: [name] DOB: [birth year] MRN: [id number]   Date of Service  06/05/2015  HPI/Events of Note  Hypoglycemia - blood glucose = 76.   eICU Interventions  Will change IV fluid to D5 0.9 NaCl to run IV at 75 mL/hour.      Intervention Category Intermediate Interventions: Other:  Lysle Dingwall 06/05/2015, 11:59 PM

## 2015-06-05 NOTE — Progress Notes (Addendum)
ANTIBIOTIC CONSULT NOTE - INITIAL  Pharmacy Consult for Vancomycin and Zosyn  Indication: rule out sepsis  No Known Allergies  Patient Measurements: Height: 5\' 7"  (170.2 cm) Weight: 120 lb (54.432 kg) IBW/kg (Calculated) : 61.6  Vital Signs: Temp: 94.3 F (34.6 C) (06/29 0630) Temp Source: Rectal (06/29 0457) BP: 128/59 mmHg (06/29 0630) Pulse Rate: 58 (06/29 0630) Intake/Output from previous day: 06/28 0701 - 06/29 0700 In: 1700 [I.V.:1700] Out: -  Intake/Output from this shift:    Labs:  Recent Labs  06/05/15 0427 06/05/15 0438  WBC 13.0*  --   HGB 10.3* 11.6*  PLT 162  --   CREATININE 1.73* 1.50*   Estimated Creatinine Clearance: 22.7 mL/min (by C-G formula based on Cr of 1.5). No results for input(s): VANCOTROUGH, VANCOPEAK, VANCORANDOM, GENTTROUGH, GENTPEAK, GENTRANDOM, TOBRATROUGH, TOBRAPEAK, TOBRARND, AMIKACINPEAK, AMIKACINTROU, AMIKACIN in the last 72 hours.   Microbiology: Recent Results (from the past 720 hour(s))  MRSA PCR Screening     Status: None   Collection Time: 05/21/15  3:00 AM  Result Value Ref Range Status   MRSA by PCR NEGATIVE NEGATIVE Final    Comment:        The GeneXpert MRSA Assay (FDA approved for NASAL specimens only), is one component of a comprehensive MRSA colonization surveillance program. It is not intended to diagnose MRSA infection nor to guide or monitor treatment for MRSA infections.   Culture, blood (x 2)     Status: None   Collection Time: 05/21/15  7:35 AM  Result Value Ref Range Status   Specimen Description BLOOD LEFT HAND  Final   Special Requests BOTTLES DRAWN AEROBIC AND ANAEROBIC 4 CC EACH  Final   Culture   Final    NO GROWTH 5 DAYS Performed at [facility]    Report Status 05/27/2015 FINAL  Final  Culture, blood (x 2)     Status: None   Collection Time: 05/21/15  7:38 AM  Result Value Ref Range Status   Specimen Description BLOOD RIGHT HAND  Final   Special Requests BOTTLES DRAWN AEROBIC  ONLY 5CC  Final   Culture   Final    NO GROWTH 5 DAYS Performed at [facility]    Report Status 05/27/2015 FINAL  Final  Clostridium Difficile by PCR (not at [facility])     Status: None   Collection Time: 05/21/15 12:00 PM  Result Value Ref Range Status   C difficile by pcr NEGATIVE NEGATIVE Final    Medical History: Past Medical History  Diagnosis Date  . OSA (obstructive sleep apnea)     AHI-44/hr, AHI during REM 61.8/hr, avg O2 during REM and NREM 96%  . Atrial fibrillation   . Myocardial infarct   . Diabetes mellitus   . Hearing loss   . Hypertension   . Hyperlipidemia   . Senile degeneration of brain   . Other generalized ischemic cerebrovascular disease   . Personal history of fall   . Pain in joint, shoulder region   . 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 Loss of weight   . Chronic kidney disease, stage II (mild)   . Obstructive sleep apnea (adult) (pediatric)   . Senile degeneration of brain   . Dizziness and giddiness   . Personal history of noncompliance with medical treatment, presenting hazards to health   . Dyskinesia of esophagus   . Shortness of breath   . Chest pain, unspecified 06/19/2009  . Long term (current) use  of anticoagulants   . Unspecified hereditary and idiopathic peripheral neuropathy   . Pain in joint, site unspecified   . Carpal tunnel syndrome   . Other hammer toe (acquired)   . Alopecia areata   . Dysphagia, unspecified(787.20)   . Occlusion and stenosis of carotid artery without mention of cerebral infarction   . Other symptoms involving cardiovascular system   . Other acne   . Allergic rhinitis, cause unspecified   . Type II or unspecified type diabetes mellitus without mention of complication, uncontrolled   . Varicose veins of lower extremities   . Cervicalgia   . Disturbance of salivary secretion   . Insomnia, unspecified   . Unspecified hypothyroidism   . Palpitations 715.90  . Other malaise and  fatigue   . Diverticulosis of colon (without mention of hemorrhage)   . Benign neoplasm of colon   . Depressive disorder, not elsewhere classified   . Syncope and collapse   . Paroxysmal atrial fibrillation   . Hypertension   . Dizziness     Medications:  Prescriptions prior to admission  Medication Sig Dispense Refill Last Dose  . diltiazem (CARDIZEM CD) 180 MG 24 hr capsule Take 1 capsule (180 mg total) by mouth daily. 30 capsule 0   . estradiol (ESTRACE) 0.5 MG tablet TAKE 1 TABLET EVERY DAY FOR HORMONES 90 tablet 3 05/20/2015 at Unknown time  . hydroxypropyl methylcellulose (ISOPTO TEARS) 2.5 % ophthalmic solution Place 1 drop into both eyes as needed for dry eyes.   unk  . levothyroxine (SYNTHROID, LEVOTHROID) 75 MCG tablet Take 1 tablet (75 mcg total) by mouth daily before breakfast. 30 tablet 5   . metFORMIN (GLUCOPHAGE) 500 MG tablet TAKE 2 TABLETS BY MOUTH EVERY MORNING AND 1 TAB AT SUPPER TO CONTROL BLOOD SUGAR 90 tablet 5 05/20/2015 at Unknown time  . metoprolol (LOPRESSOR) 50 MG tablet TAKE 1 TABLET BY MOUTH TWICE A DAY FOR BLOOD PRESSURE 60 tablet 5 05/20/2015 at 1200  . spironolactone (ALDACTONE) 25 MG tablet TAKE 1 TABLET BY MOUTH TWICE A DAY TO STRENGTHEN THE HEART AND REGULATE BLOOD PRESSURE 60 tablet 5 05/20/2015 at Unknown time  . warfarin (COUMADIN) 5 MG tablet Take 1 to 1.5 tablets by mouth daily as directed by coumadin clinic (Patient taking differently: Take 5-7.5 mg by mouth daily. Take 7.5mg  on Monday, Wednesday, and Fridays. Take 5mg  on all other days of the week.) 40 tablet 3 05/19/2015   Assessment: 81 y.o. female with AMS/VRDF, possible sepsis, for empiric antibiotics  Vancomycin 1 g IV given in ED at 0500  Goal of Therapy:  Vancomycin trough level 15-20 mcg/ml  Plan:  Vancomycin 750 mg IV q48h Zosyn 3.375 g IV q8h   Caryl Pina 06/05/2015,7:08 AM  ADDENDUM:  - With new SCr at 1.5, estimated CrCl ~22 ml/min so vancomycin dose will be changed to  500 mg IV q24h - Will monitor renal function, temp, WBC, C&S and obtain vanc levels as appropriate  Mikaele Stecher K. Velva Harman, PharmD, [place] Clinical Pharmacist - Resident Pager: [phone] Pharmacy: [phone] 06/05/2015 9:54 AM

## 2015-06-05 NOTE — Significant Event (Addendum)
Wasted approximately 247cc of fentanyl in sink and flushed-wasted this with Lyla Son, RN.

## 2015-06-05 NOTE — Significant Event (Signed)
Patient has blood work due for 7am including lactic acid. Patient came to unit at shift change. RN called phlebotomy department since 0745am this morning. Called them X 3. Lab personel stated they are backup with stat blood work and will be here as soon as possible. RN drew her lab by peripheral stick. Was able to send all except a BMP which was eventually drew by phlebotomy around 10 am.

## 2015-06-06 ENCOUNTER — Inpatient Hospital Stay (HOSPITAL_COMMUNITY): Payer: Medicare Other

## 2015-06-06 DIAGNOSIS — R4182 Altered mental status, unspecified: Secondary | ICD-10-CM

## 2015-06-06 LAB — GLUCOSE, CAPILLARY
GLUCOSE-CAPILLARY: 122 mg/dL — AB (ref 65–99)
Glucose-Capillary: 127 mg/dL — ABNORMAL HIGH (ref 65–99)
Glucose-Capillary: 113 mg/dL — ABNORMAL HIGH (ref 65–99)
Glucose-Capillary: 119 mg/dL — ABNORMAL HIGH (ref 65–99)
Glucose-Capillary: 247 mg/dL — ABNORMAL HIGH (ref 65–99)
Glucose-Capillary: 157 mg/dL — ABNORMAL HIGH (ref 65–99)

## 2015-06-06 LAB — URINE CULTURE: Culture: NO GROWTH

## 2015-06-06 LAB — BLOOD GAS, ARTERIAL
Acid-base deficit: 0.4 mmol/L (ref 0.0–2.0)
BICARBONATE: 22.1 meq/L (ref 20.0–24.0)
DRAWN BY: 44066
FIO2: 0.4 %
O2 Saturation: 97.6 %
PEEP: 5 cmH2O
Patient temperature: 98.9
RATE: 14 resp/min
TCO2: 22.9 mmol/L (ref 0–100)
VT: 500 mL
pCO2 arterial: 26.7 mmHg — ABNORMAL LOW (ref 35.0–45.0)
pH, Arterial: 7.529 — ABNORMAL HIGH (ref 7.350–7.450)
pO2, Arterial: 88.6 mmHg (ref 80.0–100.0)

## 2015-06-06 LAB — CBC
HCT: 27.8 % — ABNORMAL LOW (ref 36.0–46.0)
Hemoglobin: 9.6 g/dL — ABNORMAL LOW (ref 12.0–15.0)
MCH: 33.7 pg (ref 26.0–34.0)
MCHC: 34.5 g/dL (ref 30.0–36.0)
MCV: 97.5 fL (ref 78.0–100.0)
Platelets: 197 10*3/uL (ref 150–400)
RBC: 2.85 MIL/uL — AB (ref 3.87–5.11)
RDW: 15.5 % (ref 11.5–15.5)
WBC: 13.8 10*3/uL — ABNORMAL HIGH (ref 4.0–10.5)

## 2015-06-06 LAB — BASIC METABOLIC PANEL
Anion gap: 10 (ref 5–15)
BUN: 17 mg/dL (ref 6–20)
CO2: 23 mmol/L (ref 22–32)
CREATININE: 1.74 mg/dL — AB (ref 0.44–1.00)
Calcium: 8.4 mg/dL — ABNORMAL LOW (ref 8.9–10.3)
Chloride: 105 mmol/L (ref 101–111)
GFR calc Af Amer: 29 mL/min — ABNORMAL LOW (ref >60)
GFR, EST NON AFRICAN AMERICAN: 25 mL/min — AB (ref >60)
Glucose, Bld: 143 mg/dL — ABNORMAL HIGH (ref 65–99)
Potassium: 4 mmol/L (ref 3.5–5.1)
Sodium: 138 mmol/L (ref 135–145)

## 2015-06-06 LAB — MAGNESIUM: Magnesium: 1.2 mg/dL — ABNORMAL LOW (ref 1.7–2.4)

## 2015-06-06 LAB — PROTIME-INR
INR: 3.67 — AB (ref 0.00–1.49)
PROTHROMBIN TIME: 35.6 s — AB (ref 11.6–15.2)

## 2015-06-06 LAB — APTT: APTT: 37 s (ref 24–37)

## 2015-06-06 MED ORDER — SODIUM CHLORIDE 0.9 % IV BOLUS (SEPSIS)
1000.0000 mL | Freq: Once | INTRAVENOUS | Status: AC
  Administered 2015-06-06: 1000 mL via INTRAVENOUS

## 2015-06-06 MED ORDER — AMIODARONE HCL IN DEXTROSE 360-4.14 MG/200ML-% IV SOLN
30.0000 mg/h | INTRAVENOUS | Status: DC
  Administered 2015-06-07: 30 mg/h via INTRAVENOUS
  Filled 2015-06-06 (×3): qty 200

## 2015-06-06 MED ORDER — SODIUM CHLORIDE 0.9 % IV SOLN: INTRAVENOUS | Status: DC

## 2015-06-06 MED ORDER — AMIODARONE HCL IN DEXTROSE 360-4.14 MG/200ML-% IV SOLN
60.0000 mg/h | INTRAVENOUS | Status: AC
  Administered 2015-06-06: 60 mg/h via INTRAVENOUS
  Filled 2015-06-06: qty 400

## 2015-06-06 MED ORDER — AMIODARONE LOAD VIA INFUSION
150.0000 mg | Freq: Once | INTRAVENOUS | Status: AC
  Administered 2015-06-06: 150 mg via INTRAVENOUS
  Filled 2015-06-06: qty 83.34

## 2015-06-06 MED ORDER — CETYLPYRIDINIUM CHLORIDE 0.05 % MT LIQD
7.0000 mL | Freq: Two times a day (BID) | OROMUCOSAL | Status: DC
  Administered 2015-06-06 – 2015-06-07 (×3): 7 mL via OROMUCOSAL

## 2015-06-06 MED ORDER — INSULIN ASPART 100 UNIT/ML ~~LOC~~ SOLN
0.0000 [IU] | Freq: Three times a day (TID) | SUBCUTANEOUS | Status: DC
  Administered 2015-06-07 (×2): 2 [IU] via SUBCUTANEOUS

## 2015-06-06 NOTE — Progress Notes (Signed)
PULMONARY / CRITICAL CARE MEDICINE   Name: [name] MRN: [id number] DOB: 1925-09-27    ADMISSION DATE:  06/05/2015 CONSULTATION DATE:  06/05/2015  REFERRING MD :  Dina Rich, EDP  CHIEF COMPLAINT:  Acute encephalopathy  INITIAL PRESENTATION:  81 y/o female with a history of atrial fib and a recent hospitalization for AKI was admitted on 6/29 from the [facility] ED with bradycardia due to hyperkalemia and acidosis.  MAJOR EVENTS/TEST RESULTS: 6/29 CTAP: There is a loop of mildly distended fluid filled small bowel in the central pelvis with associated small bowel feces sign. Interloop mesenteric fluid is associated. Given the configuration, closed loop obstruction is a consideration although this is difficult to confirm without oral or IV contrast. Distention from early mechanical small bowel obstruction would also be a consideration  INDWELLING DEVICES:: ETT 6/29 >> 6/30  MICRO DATA: Urine 6/29 >> NEG Resp 6/29 >>  Blood 6/29 >>    ANTIMICROBIALS:  Vanc 6/29 >> 6/30 Pip-tazo 6/29 >> 6/30   SUBJECTIVE:  Cognition much improved. Passed SBT and extubated. Tolerating well. Rapid AF > amiodarone initiated  VITAL SIGNS: Temp:  [98 F (36.7 C)-100.5 F (38.1 C)] 98 F (36.7 C) (06/30 1553) Pulse Rate:  [44-125] 44 (06/30 1815) Resp:  [12-30] 20 (06/30 1815) BP: (72-154)/(30-131) 154/131 mmHg (06/30 1815) SpO2:  [93 %-100 %] 99 % (06/30 1815) FiO2 (%):  [30 %-40 %] 30 % (06/30 0740) Weight:  [60.8 kg (134 lb 0.6 oz)] 60.8 kg (134 lb 0.6 oz) (06/30 0500) HEMODYNAMICS:   VENTILATOR SETTINGS: Vent Mode:  [-] PRVC FiO2 (%):  [30 %-40 %] 30 % Set Rate:  [14 bmp] 14 bmp Vt Set:  [500 mL] 500 mL PEEP:  [5 cmH20] 5 cmH20 Plateau Pressure:  [13 cmH20-19 cmH20] 19 cmH20 INTAKE / OUTPUT:  Intake/Output Summary (Last 24 hours) at 06/06/15 1834 Last data filed at 06/06/15 1800  Gross per 24 hour  Intake 1568.3 ml  Output   1620 ml  Net  -51.7 ml    PHYSICAL EXAMINATION: General:   RASS 0, + F/C Neuro: No focal deficits HEENT: NCAT, WNL Cardiovascular: Reg, no M Lungs:  Clear anteriorly Abdomen: soft, +BS Ext: no edema, warm  LABS:  CBC  Recent Labs Lab 06/05/15 0427 06/05/15 0438 06/06/15 0247  WBC 13.0*  --  13.8*  HGB 10.3* 11.6* 9.6*  HCT 31.7* 34.0* 27.8*  PLT 162  --  197   Coag's  Recent Labs Lab 06/05/15 0427 06/05/15 1230 06/06/15 0247  APTT  --   --  37  INR 4.74* 4.15* 3.67*   BMET  Recent Labs Lab 06/05/15 0427 06/05/15 0438 06/05/15 1007 06/06/15 0247  NA 131* 132* 138 138  K 5.8* 5.7* 4.4 4.0  CL 102 105 105 105  CO2 9*  --  20* 23  BUN 16 19 18 17   CREATININE 1.73* 1.50* 1.58* 1.74*  GLUCOSE 399* 394* 159* 143*   Electrolytes  Recent Labs Lab 06/05/15 0427 06/05/15 1007 06/06/15 0247  CALCIUM 7.8* 9.3 8.4*  MG  --   --  1.2*   Sepsis Markers  Recent Labs Lab 06/05/15 0438 06/05/15 0900 06/05/15 1007  LATICACIDVEN 10.74* 3.5*  --   PROCALCITON  --   --  0.45   ABG  Recent Labs Lab 06/05/15 0519 06/06/15 0450  PHART 7.273* 7.529*  PCO2ART 31.6* 26.7*  PO2ART 102.0* 88.6   Liver Enzymes  Recent Labs Lab 06/05/15 0427  AST 905*  ALT 441*  ALKPHOS 338*  BILITOT 0.9  ALBUMIN 2.5*   Cardiac Enzymes  Recent Labs Lab 06/05/15 1007 06/05/15 1230 06/05/15 2024  TROPONINI <0.03 <0.03 <0.03   Glucose  Recent Labs Lab 06/05/15 1912 06/05/15 2340 06/06/15 0353 06/06/15 0739 06/06/15 1146 06/06/15 1551  GLUCAP 82 76 122* 127* 113* 119*    CXR: small R effusion    ASSESSMENT / PLAN:  PULMONARY A:  VDRF due to AMS, appears resolved P:   Monitor in ICU post extubation Supp O2 as needed.   CARDIOVASCULAR  A:  Profound bradycardia due to hyperkalemia, resolved PAF with RVR Hypotension, recurrent. Appears to be volume responsive Lactic acidosis - resolved Hypertension, resolved P:  Holding Metoprolol and Cardizem Cont telemetry monitoring Amiodarone 6/30 Holding  warfarin  RENAL A:   AKI, nonoliguric Metabolic acidosis, resolved Hyperkalemia - resolved P:   Monitor BMET intermittently Monitor I/Os Correct electrolytes as indicated  GASTROINTESTINAL A:   Abdominal pain  Concern for ischemic bowel - abd exam unimpressive 6/30 P:   SUP: IV famotidine Begin diet 6/30  HEMATOLOGIC A:   Prolonged prothrombin time due to warfarin P:  DVT px: warfarin Monitor CBC intermittently Transfuse per usual ICU guidelines  INFECTIOUS A:   Doubt severe sepsis  P:   Micro and abx as above  ENDOCRINE A:  DM 2 P:   Cont SSI  NEUROLOGIC A:   Acute encephalopathy, resolved P:   RASS goal: 0 Minimize sedation/opiates   FAMILY  Updates: Son in person 6/30   CCM time 9 minutes    Merton Border, MD ; [facility] service Mobile [phone].  After 5:30 PM or weekends, call [phone]   06/06/2015, 6:34 PM   ADD: I discussed goals of care with son. We agree that she is not a candidate for prolonged mechanical ventilation, major surgical intervention. Further HD or ACLS. I expressed optimism, however, that she might be extubated expeditiously and still have a good recovery  Merton Border, MD ; [facility] service Mobile [phone].  After 5:30 PM or weekends, call [phone]

## 2015-06-06 NOTE — Procedures (Signed)
Extubation Procedure Note  Patient Details:   Name: [name] DOB: 11/26/1925 MRN: [id number]   Airway Documentation:     Evaluation  O2 sats: stable throughout Complications: No apparent complications Patient did tolerate procedure well. Bilateral Breath Sounds: Rhonchi Suctioning: Airway Yes   No Stridor noted. Positive leak.  [place] Amber Verlin Uher 06/06/2015, 10:20 AM

## 2015-06-06 NOTE — Progress Notes (Signed)
eLink Physician-Brief Progress Note Patient Name: [name] DOB: 1925-03-09 MRN: [id number]  Date of Service  06/06/2015   HPI/Events of Note   Was hypotensive. eRN ordered 1L fluid under my Verbal ORder. BP now 109/64  eICU Interventions  monitor   Intervention Category Intermediate Interventions: Arrhythmia - evaluation and management  Deshawna Mcneece 06/06/2015, 5:53 PM

## 2015-06-06 NOTE — Progress Notes (Signed)
eLink Physician-Brief Progress Note Patient Name: [name] DOB: 17-Oct-1925 MRN: [id number]   Date of Service  06/06/2015  HPI/Events of Note  Now in AFIB with ventricular rate = 100 - 120. Hx of paroxsymal AFIB on Warfarin. BMP ordered for AM.  eICU Interventions  Will add Magnesium level to AM labs.      Intervention Category Intermediate Interventions: Arrhythmia - evaluation and management  Julis Haubner Eugene 06/06/2015, 2:12 AM

## 2015-06-07 ENCOUNTER — Inpatient Hospital Stay (HOSPITAL_COMMUNITY): Payer: Medicare Other

## 2015-06-07 DIAGNOSIS — E785 Hyperlipidemia, unspecified: Secondary | ICD-10-CM

## 2015-06-07 LAB — COMPREHENSIVE METABOLIC PANEL
ALT: 397 U/L — ABNORMAL HIGH (ref 14–54)
AST: 253 U/L — ABNORMAL HIGH (ref 15–41)
Albumin: 2.6 g/dL — ABNORMAL LOW (ref 3.5–5.0)
Alkaline Phosphatase: 261 U/L — ABNORMAL HIGH (ref 38–126)
Anion gap: 9 (ref 5–15)
BILIRUBIN TOTAL: 0.3 mg/dL (ref 0.3–1.2)
BUN: 13 mg/dL (ref 6–20)
CALCIUM: 8.3 mg/dL — AB (ref 8.9–10.3)
CO2: 23 mmol/L (ref 22–32)
Chloride: 106 mmol/L (ref 101–111)
Creatinine, Ser: 1.47 mg/dL — ABNORMAL HIGH (ref 0.44–1.00)
GFR calc non Af Amer: 31 mL/min — ABNORMAL LOW (ref >60)
GFR, EST AFRICAN AMERICAN: 36 mL/min — AB (ref >60)
GLUCOSE: 142 mg/dL — AB (ref 65–99)
POTASSIUM: 3.6 mmol/L (ref 3.5–5.1)
SODIUM: 138 mmol/L (ref 135–145)
Total Protein: 5.3 g/dL — ABNORMAL LOW (ref 6.5–8.1)

## 2015-06-07 LAB — PROTIME-INR
INR: 2.65 — ABNORMAL HIGH (ref 0.00–1.49)
Prothrombin Time: 27.9 seconds — ABNORMAL HIGH (ref 11.6–15.2)

## 2015-06-07 LAB — CBC
HCT: 27.6 % — ABNORMAL LOW (ref 36.0–46.0)
Hemoglobin: 9.3 g/dL — ABNORMAL LOW (ref 12.0–15.0)
MCH: 32.7 pg (ref 26.0–34.0)
MCHC: 33.7 g/dL (ref 30.0–36.0)
MCV: 97.2 fL (ref 78.0–100.0)
Platelets: 194 10*3/uL (ref 150–400)
RBC: 2.84 MIL/uL — ABNORMAL LOW (ref 3.87–5.11)
RDW: 15.7 % — ABNORMAL HIGH (ref 11.5–15.5)
WBC: 10.3 10*3/uL (ref 4.0–10.5)

## 2015-06-07 LAB — GLUCOSE, CAPILLARY
GLUCOSE-CAPILLARY: 121 mg/dL — AB (ref 65–99)
GLUCOSE-CAPILLARY: 139 mg/dL — AB (ref 65–99)
Glucose-Capillary: 182 mg/dL — ABNORMAL HIGH (ref 65–99)
Glucose-Capillary: 106 mg/dL — ABNORMAL HIGH (ref 65–99)

## 2015-06-07 LAB — PROCALCITONIN: Procalcitonin: 0.85 ng/mL

## 2015-06-07 MED ORDER — AMIODARONE HCL 200 MG PO TABS
400.0000 mg | ORAL_TABLET | Freq: Two times a day (BID) | ORAL | Status: DC
  Administered 2015-06-07 – 2015-06-09 (×5): 400 mg via ORAL
  Filled 2015-06-07 (×7): qty 2

## 2015-06-07 MED ORDER — ENSURE ENLIVE PO LIQD
237.0000 mL | Freq: Two times a day (BID) | ORAL | Status: DC
  Administered 2015-06-07 – 2015-06-09 (×6): 237 mL via ORAL

## 2015-06-07 MED ORDER — INSULIN ASPART 100 UNIT/ML ~~LOC~~ SOLN
0.0000 [IU] | Freq: Three times a day (TID) | SUBCUTANEOUS | Status: DC
Start: 2015-06-08 — End: 2015-06-09
  Administered 2015-06-08: 2 [IU] via SUBCUTANEOUS
  Administered 2015-06-09: 3 [IU] via SUBCUTANEOUS

## 2015-06-07 MED ORDER — METOPROLOL TARTRATE 25 MG PO TABS
25.0000 mg | ORAL_TABLET | Freq: Two times a day (BID) | ORAL | Status: DC
  Administered 2015-06-07 – 2015-06-09 (×5): 25 mg via ORAL
  Filled 2015-06-07 (×6): qty 1

## 2015-06-07 MED ORDER — LEVOTHYROXINE SODIUM 50 MCG PO TABS
50.0000 ug | ORAL_TABLET | Freq: Every day | ORAL | Status: DC
  Administered 2015-06-07 – 2015-06-09 (×3): 50 ug via ORAL
  Filled 2015-06-07 (×5): qty 1

## 2015-06-07 MED ORDER — WARFARIN - PHARMACIST DOSING INPATIENT: Freq: Every day | Status: DC

## 2015-06-07 MED ORDER — WARFARIN SODIUM 7.5 MG PO TABS
7.5000 mg | ORAL_TABLET | Freq: Once | ORAL | Status: AC
  Administered 2015-06-07: 7.5 mg via ORAL
  Filled 2015-06-07: qty 1

## 2015-06-07 MED FILL — Medication: Qty: 1 | Status: AC

## 2015-06-07 NOTE — Progress Notes (Signed)
Called to eval left arm ecchymosis and swelling.   Pt resting comfortably in bed. Awake and in no distress. Patient reports it had actually improved since admission. Review of nursing notes demonstrate ecchymosis since admit. It does appear that her left arm is more swollen and painful than her right. Her INR is therapeutic (on coumadin for AF).   Ecchymosis and LUE swelling.  Not clear if this was trauma related or IV infiltrate. She was supra-therapeutic on admission w/ INR 4/.15.Marland Kitchen At any rates she feels it is improving.   Plan Elevate left UE Warm compress for comfort Cont coumadin per pharmacy  No role for [place] for DVT. Would not change rx as already a life long anticoagulation and already supra-therapeutic.    Erick Colace ACNP-BC [place] Pager # [phone] OR # [phone] if no answer

## 2015-06-07 NOTE — Progress Notes (Signed)
Nutrition Follow-up  DOCUMENTATION CODES:  Non-severe (moderate) malnutrition in context of chronic illness  INTERVENTION:  Ensure Enlive (each supplement provides 350kcal and 20 grams of protein) BID  Recommend diet liberalization to improve intake.   NUTRITION DIAGNOSIS:  Malnutrition related to chronic illness as evidenced by moderate depletions of muscle mass, moderate depletion of body fat.  ongoing  GOAL:  Patient will meet greater than or equal to 90% of their needs  Not met  MONITOR:  PO intake, Supplement acceptance, I & O's, Labs   ASSESSMENT:  81 y/o female with a history of atrial fib and a recent hospitalization for AKI was admitted on 6/29 from the [facility] ED with bradycardia due to hyperkalemia and acidosis.  Pt extubated 6/30 and started on CHO Modified/Heart Healthy diet, consuming about 50% of meals.   Labs reviewed: liver enzymes elevated, cbgs: 121-247 Per MD no further vent, HD but hopeful for recovery.   Height:  Ht Readings from Last 1 Encounters:  06/05/15 5' 7"  (1.702 m)    Weight:  Wt Readings from Last 1 Encounters:  06/06/15 134 lb 0.6 oz (60.8 kg)    Ideal Body Weight:  56.8 kg  Wt Readings from Last 10 Encounters:  06/06/15 134 lb 0.6 oz (60.8 kg)  05/30/15 129 lb 1.6 oz (58.559 kg)  04/03/15 126 lb 3.2 oz (57.244 kg)  01/09/15 126 lb (57.153 kg)  11/20/14 122 lb 3.2 oz (55.43 kg)  07/23/14 123 lb 6.4 oz (55.974 kg)  07/17/14 122 lb 3.2 oz (55.43 kg)  04/18/14 124 lb (56.246 kg)  12/12/13 121 lb 12.8 oz (55.248 kg)  10/26/13 122 lb (55.339 kg)    BMI:  Body mass index is 20.99 kg/(m^2).  Estimated Nutritional Needs:  Kcal:  1400-1600  Protein:  75-85  Fluid:   >/= 1.5 L/day  Skin:  Reviewed, no issues  Diet Order:  Diet heart healthy/carb modified Room service appropriate?: Yes; Fluid consistency:: Thin  EDUCATION NEEDS:  No education needs identified at this time   Intake/Output Summary (Last 24 hours) at  06/07/15 1006 Last data filed at 06/07/15 0947  Gross per 24 hour  Intake 1203.49 ml  Output   1770 ml  Net -566.51 ml    Last BM:  6/30  [place], [place], [place] Pager [phone] After Hours Pager

## 2015-06-07 NOTE — Evaluation (Signed)
Physical Therapy Evaluation Patient Details Name: [name] MRN: [id number] DOB: [birth year] Today's Date: 06/07/2015   History of Present Illness  81 y/o female with a history of atrial fib and a recent hospitalization for AKI  (05/20/15-05/26/15) was admitted on 6/29 from the [facility] ED with bradycardia due to hyperkalemia and acidosis. Was intubated 6/29-6/30.  Clinical Impression  Patient presents with decreased independence with mobiilty due to deficits listed in PT problem list.  She will need 24 hour assist for safety due to subtle cognitive issues and generalized weakness.  If family unable to provide 24 hour assist may need SNF rehab prior to d/c.  Will follow acutely.    Follow Up Recommendations Home health PT;Supervision/Assistance - 24 hour    Equipment Recommendations  None recommended by PT    Recommendations for Other Services       Precautions / Restrictions Precautions Precautions: Fall      Mobility  Bed Mobility         Supine to sit: Modified independent (Device/Increase time)        Transfers Overall transfer level: Needs assistance Equipment used: Rolling walker (2 wheeled)   Sit to Stand: Min guard         General transfer comment: cues for hand placement  Ambulation/Gait Ambulation/Gait assistance: Min assist Ambulation Distance (Feet): 160 Feet Assistive device: Rolling walker (2 wheeled) Gait Pattern/deviations: Step-through pattern;Decreased stride length     General Gait Details: ambulated in hall with RW, assisted with side stepping through narrow space with RW, hand hold assist in room back to chair.    Stairs            Wheelchair Mobility    Modified Rankin (Stroke Patients Only)       Balance     Sitting balance-Leahy Scale: Good       Standing balance-Leahy Scale: Good                               Pertinent Vitals/Pain Pain Assessment: No/denies pain    Home Living Family/patient expects  to be discharged to:: Private residence Living Arrangements: Alone Available Help at Discharge: Family;Available PRN/intermittently Type of Home: House Home Access: Stairs to enter Entrance Stairs-Rails: Right Entrance Stairs-Number of Steps: 3 Home Layout: One level Home Equipment: Walker - 4 wheels;Cane - single point;Shower seat - built in;Grab bars - toilet;Grab bars - tub/shower      Prior Function Level of Independence: Independent with assistive device(s)         Comments: used rollator occasionally     Hand Dominance   Dominant Hand: Right    Extremity/Trunk Assessment   Upper Extremity Assessment: Generalized weakness           Lower Extremity Assessment: Generalized weakness      Cervical / Trunk Assessment: Normal  Communication   Communication: No difficulties  Cognition Arousal/Alertness: Awake/alert Behavior During Therapy: WFL for tasks assessed/performed Overall Cognitive Status: Impaired/Different from baseline     Current Attention Level: Sustained           General Comments: intermittent recall of events; not sure of who was staying with her, but chart notes from last admission report pt had sister in law who was going to stay.    General Comments General comments (skin integrity, edema, etc.): daughter in room, unaware of where her mother was prior to coming to hospital    Exercises  Assessment/Plan    PT Assessment Patient needs continued PT services  PT Diagnosis Generalized weakness   PT Problem List Decreased strength;Decreased cognition;Decreased activity tolerance;Decreased safety awareness;Decreased mobility;Decreased balance;Decreased knowledge of use of DME  PT Treatment Interventions DME instruction;Gait training;Stair training;Functional mobility training;Therapeutic activities;Therapeutic exercise;Patient/family education;Cognitive remediation   PT Goals (Current goals can be found in the Care Plan section)  Acute Rehab PT Goals Patient Stated Goal: To go home PT Goal Formulation: With patient/family Time For Goal Achievement: 06/21/15 Potential to Achieve Goals: Good    Frequency Min 3X/week   Barriers to discharge   unknown amount of help available at d/c    Co-evaluation               End of Session Equipment Utilized During Treatment: Gait belt Activity Tolerance: Patient tolerated treatment well Patient left: in chair;with call bell/phone within reach;with chair alarm set;with family/visitor present           Time: 1530-1607 PT Time Calculation (min) (ACUTE ONLY): 37 min   Charges:   PT Evaluation $Initial PT Evaluation Tier I: 1 Procedure PT Treatments $Gait Training: 8-22 mins   PT G Codes:        WYNN,CYNDI 06/29/15, 4:30 PM  Magda Kiel, [place] 06-29-15

## 2015-06-07 NOTE — Progress Notes (Signed)
[place] Progress Note Patient Name: [name] DOB: 1925-05-03 MRN: [id number]  Date of Service  06/07/2015   HPI/Events of Note  Bedside RN - night shfit calling. Meeting patient first time in 1 week.   LUE is entire extremity - 2+ edem and forearm down is purple and hand is 3+ edema with purple color. Can feel pulses, Can squeeze.   Says patient confused   New finding since RN last saw patuient but unclear how new  eICU Interventions  ? New findings  APP PEte BAbcock to bedside   Intervention Category Intermediate Interventions: communication  Vaishnavi Dalby 06/07/2015, 8:34 PM

## 2015-06-07 NOTE — Progress Notes (Signed)
1400 transfer from [place] unit  .Marland Kitchen No apparent distress notef . Kept comfortable in bed,Needs attended . Safety precaution observed

## 2015-06-07 NOTE — Consult Note (Signed)
CARDIOLOGY CONSULT NOTE   Patient ID: [name] MRN: [id number], DOB/AGE: 06/29/1925   Admit date: 06/05/2015 Date of Consult: 06/07/2015   Primary Physician: Estill Dooms, MD Primary Cardiologist: Dr. Gwenlyn Found  Pt. Profile  pleasant Caucasian female with past medical history of HTN, IDDM, OSA intolerant to CPAP, and a history of PAF on chronic Coumadin present with AMS, respiratory failure, junctional bradycardia.  Problem List  Past Medical History  Diagnosis Date  . OSA (obstructive sleep apnea)     AHI-44/hr, AHI during REM 61.8/hr, avg O2 during REM and NREM 96%  . Atrial fibrillation   . Myocardial infarct   . Diabetes mellitus   . Hearing loss   . Hypertension   . Hyperlipidemia   . Senile degeneration of brain   . Other generalized ischemic cerebrovascular disease   . Personal history of fall   . Pain in joint, shoulder region   . 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 Loss of weight   . Chronic kidney disease, stage II (mild)   . Obstructive sleep apnea (adult) (pediatric)   . Senile degeneration of brain   . Dizziness and giddiness   . Personal history of noncompliance with medical treatment, presenting hazards to health   . Dyskinesia of esophagus   . Shortness of breath   . Chest pain, unspecified 06/19/2009  . Long term (current) use of anticoagulants   . Unspecified hereditary and idiopathic peripheral neuropathy   . Pain in joint, site unspecified   . Carpal tunnel syndrome   . Other hammer toe (acquired)   . Alopecia areata   . Dysphagia, unspecified(787.20)   . Occlusion and stenosis of carotid artery without mention of cerebral infarction   . Other symptoms involving cardiovascular system   . Other acne   . Allergic rhinitis, cause unspecified   . Type II or unspecified type diabetes mellitus without mention of complication, uncontrolled   . Varicose veins of lower extremities   . Cervicalgia   . Disturbance of  salivary secretion   . Insomnia, unspecified   . Unspecified hypothyroidism   . Palpitations 715.90  . Other malaise and fatigue   . Diverticulosis of colon (without mention of hemorrhage)   . Benign neoplasm of colon   . Depressive disorder, not elsewhere classified   . Syncope and collapse   . Paroxysmal atrial fibrillation   . Hypertension   . Dizziness     Past Surgical History  Procedure Laterality Date  . Total abdominal hysterectomy    . Tonsillectomy    . Appendectomy    . Hand surgery    . Cataract od  10/04/2012  . Cataract os  01/31/2013  . Carotid doppler  07/25/2012    Bilat internal carotid arteries demonstrate 1-395 stenoses.  . Transthoracic echocardiogram  04/24/2011    EF >55%, moderate tricuspid regurg.     Allergies  No Known Allergies  HPI   [name] is pleasant Caucasian female with past medical history of HTN, IDDM, OSA intolerant to CPAP, and a history of PAF on chronic Coumadin. She had a previous cardiac catheterization in May 2008 which showed EF 50%, 20% ostial LAD stenosis, 30% proximal D1 stenosis. She has been taking medications include lisinopril, spironolactone and metoprolol for many years. Her paroxysmal atrial fibrillation has been previously controlled on metoprolol 50 mg twice a day alone. However in the recent month, she has been having recurrent paroxysmal atrial fibrillation esp during admission  for AKI. Diltiazem 180 mg was added to her medical regimen. She was last seen by Dr. Gwenlyn Found in the clinic in February 2016 at which time she was doing well.   She was recently admitted several weeks ago for hyperkalemia and AKI and required temporary dialysis. It was unclear what was the cause for her AKI. She was eventually discharged home, however family member noted gradual decline. She was sent back to [facility] on 06/05/2015 for significant altered mental status and respiratory failure. On arrival, it was noted her heart rate was in  the 20s to 40s and appears to be in junctional rhythm on EKG. Significant laboratory and he also showed hyperkalemia with potassium of 5.8. Severe lactic acidosis >10. Her INR was supratherapeutic at 4.74. Other significant laboratory finding include glucose of 299, alkaline phosphatase 338, AST 905, ALT 441. White blood cell count was borderline elevated at 13.0. Hemoglobin 10.3. Patient also has some abdominal discomfort on arrival. A CT of abdomen was obtained given a history of atrial fibrillation to rule out ischemic bowel. It did reveal questionable bowel obstruction and thickened gallbladder wall with gallstones. Patient was intubated for short period of time and was eventually extubated on 06/06/2015. She was placed on IV amiodarone for her atrial fibrillation and the transition to by mouth amiodarone on 06/07/2015. Cardiology has been consulted for management of atrial fibrillation.  Of note, her AST and ALT has been improving since arrival, INR also return to therapeutic range. Her lactic acid also trended down.   Inpatient Medications  . amiodarone  400 mg Oral BID  . antiseptic oral rinse  7 mL Mouth Rinse BID  . calcium gluconate 1 GM IV  1 g Intravenous Once  . chlorhexidine  15 mL Mouth Rinse BID  . feeding supplement (ENSURE ENLIVE)  237 mL Oral BID BM  . insulin aspart  0-9 Units Subcutaneous TID WC  . levothyroxine  50 mcg Oral QAC breakfast  . warfarin  7.5 mg Oral ONCE-1800  . Warfarin - Pharmacist Dosing Inpatient   Does not apply q1800    Family History Family History  Problem Relation Age of Onset  . Cervical cancer Mother   . Cancer Mother   . Parkinsonism Father   . Sleep apnea Brother   . Diabetes Brother   . Sleep apnea Brother   . Peripheral vascular disease Brother   . Cancer Daughter      Social History History   Social History  . Marital Status: Widowed    Spouse Name: N/A  . Number of Children: Y  . Years of Education: N/A   Occupational History    . retired from Personal assistant    Social History Main Topics  . Smoking status: Never Smoker   . Smokeless tobacco: Not on file  . Alcohol Use: No  . Drug Use: No  . Sexual Activity: No   Other Topics Concern  . Not on file   Social History Narrative   Lives alone.     Review of Systems  General:  No chills, fever, night sweats or weight changes.  Cardiovascular:  No chest pain, edema, orthopnea, palpitations, paroxysmal nocturnal dyspnea. Dermatological: No rash, lesions/masses Respiratory: +cough, dyspnea Urologic: No hematuria, dysuria Abdominal:   No nausea, vomiting, diarrhea, bright red blood per rectum, melena, or hematemesis +intermittent abdominal discomfort. Neurologic:  No visual changes +wkns, changes in mental status. All other systems reviewed and are otherwise negative except as noted above.  Physical Exam  Blood pressure 162/116, pulse 106, temperature 98.1 F (36.7 C), temperature source Oral, resp. rate 23, height 5\' 7"  (1.702 m), weight 134 lb 0.6 oz (60.8 kg), SpO2 100 %.  General: Pleasant, NAD Psych: Normal affect. Neuro: Alert and oriented X 3. Moves all extremities spontaneously. HEENT: Normal  Neck: Supple without bruits or JVD. Lungs:  Resp regular and unlabored. Markedly diminished breath sound in bibasilar area Heart: irregular. no s3, s4, or murmurs. Abdomen: Soft, non-tender, non-distended, BS + x 4.  Extremities: No clubbing, cyanosis. DP/PT/Radials 2+ and equal bilaterally. No LE edema. L hand has hematoma on the back  Labs   Recent Labs  06/05/15 1007 06/05/15 1230 06/05/15 2024  TROPONINI <0.03 <0.03 <0.03   Lab Results  Component Value Date   WBC 10.3 06/07/2015   HGB 9.3* 06/07/2015   HCT 27.6* 06/07/2015   MCV 97.2 06/07/2015   PLT 194 06/07/2015    Recent Labs Lab 06/07/15 0235  NA 138  K 3.6  CL 106  CO2 23  BUN 13  CREATININE 1.47*  CALCIUM 8.3*  PROT 5.3*  BILITOT 0.3  ALKPHOS 261*  ALT 397*  AST 253*   GLUCOSE 142*   Lab Results  Component Value Date   CHOL 181 04/17/2013   HDL 52 04/17/2013   LDLCALC 111* 04/17/2013   TRIG 89 04/17/2013   No results found for: DDIMER  Radiology/Studies  Ct Abdomen Pelvis Wo Contrast  06/05/2015   CLINICAL DATA:  Initial encounter for sepsis of uncertain etiology with acidosis and abdominal pain.  EXAM: CT ABDOMEN AND PELVIS WITHOUT CONTRAST  TECHNIQUE: Multidetector CT imaging of the abdomen and pelvis was performed following the standard protocol without IV contrast.  COMPARISON:  05/21/2015.  FINDINGS: Lower chest: Bibasilar collapse/ consolidation with small to moderate right pleural effusion and small left pleural effusion.  Hepatobiliary: No focal abnormality is seen in the liver on this uninfused exam. Gallbladder wall appears thickened. Multiple gallstones are evident, measuring up to 13 mm in diameter. No substantial intra or extrahepatic biliary duct dilatation.  Pancreas: Poorly discriminated given the lack of intravenous contrast and body edema/ascites. No dilatation of the main duct is evident.  Spleen: Unremarkable.  Adrenals/Urinary Tract: Thickening of both adrenal glands is noted. There is no hydronephrosis in either kidney. No evidence for hydroureter current Foley catheter decompresses the urinary bladder.  Stomach/Bowel: NG tube tip is tenting the greater curvature of the stomach. Stomach is decompressed. Duodenum is nondistended. No evidence for small bowel obstruction. Therein is may fluid filled slightly distended small bowel loop in the central pelvis (see image 67 series 2) measuring 3 cm in diameter. There is some interloop mesenteric fluid associated with this loop and small bowel feces sign is evident. There is diverticular change in the colon without evidence for overt diverticulitis.  Vascular/Lymphatic: There is abdominal aortic atherosclerosis without aneurysm. No gross lymphadenopathy is seen in the abdomen or pelvis.  Reproductive:  Uterus is surgically absent. No evidence for adnexal mass.  Other: Small volume intraperitoneal free fluid is evident.  Musculoskeletal: Diffuse body wall edema noted. Bone windows reveal no worrisome lytic or sclerotic osseous lesions.  IMPRESSION: There is a loop of mildly distended fluid filled small bowel in the central pelvis with associated small bowel feces sign. Interloop mesenteric fluid is associated. Given the configuration, closed loop obstruction is a consideration although this is difficult to confirm without oral or IV contrast. Distention from early mechanical small bowel obstruction would also be a consideration.  Repeat CT imaging after administration of oral contrast may prove helpful to determine whether or not this loop opacifies.  Slight increase in pleural effusions.  Marked gallbladder wall thickening with gallstones evident. If there is clinical concern for cholecystitis, nuclear scintigraphy may prove helpful to further evaluate.   Electronically Signed   By: Misty Stanley M.D.   On: 06/05/2015 08:18   Ct Abdomen Pelvis Wo Contrast  05/21/2015   CLINICAL DATA:  Sepsis.  GI bleed.  Diarrhea.  Renal failure.  EXAM: CT ABDOMEN AND PELVIS WITHOUT CONTRAST  TECHNIQUE: Multidetector CT imaging of the abdomen and pelvis was performed following the standard protocol without IV contrast.  COMPARISON:  12/15/2003  FINDINGS: There is a moderate right pleural effusion and a small left pleural effusion. There is a small amount of ascites. Hepatic veins are somewhat distended as is the inferior vena cava consistent with elevated right heart pressure.  No focal liver lesions. There are multiple stones in the gallbladder and the gallbladder is quite distended. No dilated bile ducts. Spleen, pancreas, and adrenal glands are normal. There is a single tiny stone in the mid right kidney and there are several tiny stones in the left kidney. No hydronephrosis. There is ascites peripheral to Gerota's fascia  bilaterally. There is a tiny hiatal hernia. There are few diverticula in the distal colon but there is no evidence of diverticulitis. The mucosa of the distal sigmoid and rectum appears slightly prominent which could represent proctitis. The mucosa of the remainder of the colon appears normal. Terminal ileum and small bowel are normal.  Extensive calcification in the abdominal aorta and iliac arteries. Foley catheter in the bladder. Previous removal of the uterus and ovaries. No acute osseous abnormality.  IMPRESSION: 1. Bilateral pleural effusions, moderate on the right and small on the left. 2. Ascites. 3. Distended gallbladder with multiple gallstones. 4. Tiny bilateral renal stones. 5. Slight prominence of the mucosa in the rectum and distal sigmoid. This could represent proctitis.   Electronically Signed   By: Lorriane Shire M.D.   On: 05/21/2015 10:33   Dg Chest 2 View  05/28/2015   CLINICAL DATA:  Shortness of Breath  EXAM: CHEST  2 VIEW  COMPARISON:  May 25, 2015  FINDINGS: Central catheter has been removed. No pneumothorax. There is stable airspace consolidation in the left lower lobe. There are small pleural effusions bilaterally. There is patchy atelectasis in the right upper lobe, stable. Heart is upper normal in size with pulmonary vascularity within normal limits. No adenopathy. No bone lesions.  IMPRESSION: Persistent left lower lobe consolidation. Small bilateral effusions. Stable atelectasis right upper lobe. No change in cardiac silhouette.   Electronically Signed   By: Lowella Grip III M.D.   On: 05/28/2015 08:57   Dg Abd 1 View  06/06/2015   CLINICAL DATA:  Abdominal distention  EXAM: ABDOMEN - 1 VIEW  COMPARISON:  CT 06/05/2015  FINDINGS: The bowel gas pattern is normal. No radio-opaque calculi or other significant radiographic abnormality are seen.  IMPRESSION: Negative.   Electronically Signed   By: Conchita Paris M.D.   On: 06/06/2015 10:27   Ct Head Wo Contrast  05/21/2015    CLINICAL DATA:  Altered mental status  EXAM: CT HEAD WITHOUT CONTRAST  TECHNIQUE: Contiguous axial images were obtained from the base of the skull through the vertex without intravenous contrast.  COMPARISON:  10/13/2012  FINDINGS: Insert motion images were repeated. Mild cortical volume loss noted with proportional ventricular prominence. Areas  of periventricular white matter hypodensity are most compatible with small vessel ischemic change. No acute hemorrhage, infarct, or mass lesion is identified. Orbits and paranasal sinuses are unremarkable. No skull fracture.  IMPRESSION: No acute intracranial abnormality. Stable degree of cortical volume loss.   Electronically Signed   By: Conchita Paris M.D.   On: 05/21/2015 10:01   US Abdomen Complete  05/21/2015   CLINICAL DATA:  Abdominal pain.  Gallstones.  EXAM: ULTRASOUND ABDOMEN COMPLETE  COMPARISON:  CT abdomen and pelvis earlier this same day.  FINDINGS: Gallbladder: Stones are identified within the gallbladder measuring up to 1.5 cm. The gallbladder wall is thickened at 0.7 cm. Sonographer reports negative Murphy's sign.  Common bile duct: Diameter: 0.3 cm.  Liver: No focal lesion identified. Within normal limits in parenchymal echogenicity.  IVC: No abnormality visualized.  Pancreas: Visualized portion unremarkable.  Spleen: Size and appearance within normal limits.  Right Kidney: Length: 10.9 cm. Echogenicity within normal limits. No hydronephrosis visualized. A 0.8 cm hyperechoic lesion in the lower pole of the left kidney cannot be definitively characterized but likely represents a complex cyst.  Left Kidney: Length: 10.9 cm. Echogenicity within normal limits. No mass or hydronephrosis visualized.  Abdominal aorta: No aneurysm visualized.  Other findings: There is a small volume of abdominal ascites. Bilateral pleural effusions are seen, greater on the right.  IMPRESSION: Gallstones without evidence of cholecystitis. Gallbladder wall thickening is likely  related to small volume of abdominal ascites.  Bilateral pleural effusions.   Electronically Signed   By: Inge Rise M.D.   On: 05/21/2015 14:04   Dg Chest Port 1 View  06/07/2015   CLINICAL DATA:  Pleural effusions.  Atrial fibrillation.  EXAM: PORTABLE CHEST - 1 VIEW  COMPARISON:  June 06, 2015  FINDINGS: Endotracheal tube and nasogastric tube have been removed. No pneumothorax. There is a layering right pleural effusion. There is a smaller left effusion. No airspace consolidation is appreciable. Heart is upper normal in size with pulmonary vascularity within normal limits. No adenopathy.  IMPRESSION: Bilateral pleural effusions, right larger than left. These effusions do not appear significantly changed compared to 1 day prior. There is no pneumothorax. No edema or consolidation appreciable.   Electronically Signed   By: Lowella Grip III M.D.   On: 06/07/2015 07:48   Dg Chest Port 1 View  06/06/2015   CLINICAL DATA:  Follow-up of respiratory failure  EXAM: PORTABLE CHEST - 1 VIEW  COMPARISON:  Portable chest x-ray of June 05, 2015  FINDINGS: The lungs are well-expanded. The hemidiaphragms and costophrenic angles are excluded from the field of view however. There is increased density at the right lung base likely reflecting pleural fluid layering posteriorly. There is no alveolar infiltrate. The cardiac silhouette is top-normal in size. The pulmonary vascularity is not engorged. The mediastinum is normal in width. The endotracheal tube tip lies 3.8 cm above the carina. The esophagogastric tube tip projects below the inferior margin of the image.  IMPRESSION: COPD and probable right pleural effusion. No alveolar pneumonia is demonstrated. The support tubes are in reasonable position.   Electronically Signed   By: David  Martinique M.D.   On: 06/06/2015 07:25   Dg Chest Port 1 View  06/05/2015   CLINICAL DATA:  Dyspnea.  Intubation.  EXAM: PORTABLE CHEST - 1 VIEW  COMPARISON:  06/05/2015 at 04:49   FINDINGS: The endotracheal tube is 4.5 cm above the carina. The nasogastric tube extends into the stomach. There is no interval change  in the hazy opacities bilaterally, consistent with alveolar edema and/or effusions. There is no pneumothorax.  IMPRESSION: Support equipment appears satisfactorily positioned.  No significant interval change in the bilateral airspace opacities.   Electronically Signed   By: Andreas Newport M.D.   On: 06/05/2015 06:00   Dg Chest Portable 1 View  06/05/2015   CLINICAL DATA:  Bradycardia and hypotension. History of diabetes, hypertension, myocardial infarction, dementia.  EXAM: PORTABLE CHEST - 1 VIEW  COMPARISON:  Chest radiograph May 28, 2015  FINDINGS: The cardiac silhouette appears mildly enlarged, similar. Tortuous calcified aorta. Hazy densities in lung bases favor layering pleural effusion. Similar pulmonary vascular congestion mild interstitial prominence. No pneumothorax. Pacer pad overlies the central and lower chest. Multiple EKG lines overlie the patient and may obscure subtle underlying pathology. Soft tissue planes and included osseous structures are nonsuspicious. Mild vascular calcifications RIGHT axilla.  IMPRESSION: Mild cardiomegaly, layering pleural effusions with mild interstitial prominence favoring pulmonary edema.   Electronically Signed   By: Elon Alas M.D.   On: 06/05/2015 05:35   Dg Chest Port 1 View  05/25/2015   CLINICAL DATA:  Respiratory failure.  EXAM: PORTABLE CHEST - 1 VIEW  COMPARISON:  05/23/2015  FINDINGS: Improved aeration in the lungs suggest decreased edema and possibly decreased pleural effusions. There continues to be hazy densities at the lung bases with opacification in the retrocardiac space. The nasogastric tube and endotracheal tube have been removed. Right jugular central venous catheter in the SVC region and stable. Negative for a pneumothorax.  IMPRESSION: Improved aeration in the lungs suggests decreasing pulmonary  edema and possibly decreasing pleural effusions. There continues to be basilar densities which may represent residual pleural fluid and left basilar consolidation.  Support apparatuses as described.   Electronically Signed   By: Markus Daft M.D.   On: 05/25/2015 09:24   Dg Chest Port 1 View  05/23/2015   CLINICAL DATA:  Respiratory failure.  EXAM: PORTABLE CHEST - 1 VIEW  COMPARISON:  05/22/2015.  FINDINGS: Endotracheal tube, NG tube, right IJ line in stable position. Mediastinum hilar structures are stable. Heart size stable. Bilateral pulmonary infiltrates again noted. Slight interim improvement. Persistent small right pleural effusion. No pneumothorax.  IMPRESSION: 1. Lines and tubes in stable position. 2. Bilateral pulmonary infiltrates remain. There has been slight improvement. Persistent right pleural effusion.   Electronically Signed   By: Marcello Moores  Register   On: 05/23/2015 07:24   Dg Chest Port 1 View  05/22/2015   CLINICAL DATA:  Hypoxia  EXAM: PORTABLE CHEST - 1 VIEW  COMPARISON:  Study obtained earlier in the day.  FINDINGS: Endotracheal tube tip is 3.5 cm above the carina. Nasogastric tube tip and side port are below the diaphragm. Central catheter tip is in the superior cava. No pneumothorax. There is interstitial edema with bilateral effusions. Heart is mildly enlarged with pulmonary venous hypertension. No bone lesions.  IMPRESSION: Tube and catheter positions as described without pneumothorax. Congestive heart failure. New small left effusion compared to earlier in the day. The degree of edema and right effusion remain stable.   Electronically Signed   By: Lowella Grip III M.D.   On: 05/22/2015 08:32   Dg Chest Port 1 View  05/22/2015   CLINICAL DATA:  Decreased oxygen saturation and shortness of breath  EXAM: PORTABLE CHEST - 1 VIEW  COMPARISON:  May 21, 2015  FINDINGS: Central catheter tip is in the superior cava. No pneumothorax. There is increase in interstitial edema. There is  a new  right pleural effusion. Heart is mildly enlarged with mild pulmonary venous hypertension. There is atelectatic change superimposed on edema in the left base. No adenopathy.  IMPRESSION: Congestive heart failure with new right effusion increased edema compared to 1 day prior.   Electronically Signed   By: Lowella Grip III M.D.   On: 05/22/2015 07:26   Dg Chest Port 1 View  05/21/2015   CLINICAL DATA:  Followup acute renal failure  EXAM: PORTABLE CHEST - 1 VIEW  COMPARISON:  05/20/2015  FINDINGS: Right IJ central line, tip at the SVC level.  No pneumothorax.  New diffuse interstitial opacity, compatible with edema. Normal heart size and stable aortic tortuosity, accentuated by rotation. Hyperinflation which is stable from previous. No emphysematous changes on 04/25/2014 chest CT.  IMPRESSION: 1. New right IJ central line.  No pneumothorax. 2. New mild pulmonary edema.   Electronically Signed   By: Monte Fantasia M.D.   On: 05/21/2015 15:42   Dg Chest Portable 1 View  05/20/2015   CLINICAL DATA:  Chest pain, hypotension, syncope  EXAM: PORTABLE CHEST - 1 VIEW  COMPARISON:  04/04/2014  FINDINGS: The heart size and mediastinal contours are within normal limits. Both lungs are clear. The visualized skeletal structures are unremarkable.  IMPRESSION: No active disease.   Electronically Signed   By: Lucienne Capers M.D.   On: 05/20/2015 23:35   Dg Abd Portable 1v  05/22/2015   CLINICAL DATA:  Nasogastric tube placement  EXAM: PORTABLE ABDOMEN - 1 VIEW  COMPARISON:  CT abdomen and pelvis May 21, 2015  FINDINGS: Nasogastric tube tip and side port are within the stomach. There is no bowel dilatation or air-fluid level suggesting obstruction. No free air is seen on this supine examination. There are phleboliths in the pelvis.  IMPRESSION: Nasogastric tube tip and side port in stomach. Bowel gas pattern unremarkable.   Electronically Signed   By: Lowella Grip III M.D.   On: 05/22/2015 08:33   Dg Abd  Portable 1v  05/21/2015   CLINICAL DATA:  Chest and abdominal pain. Shortness of breath. Hypotension.  EXAM: PORTABLE ABDOMEN - 1 VIEW  COMPARISON:  01/07/2011  FINDINGS: The right abdomen and low pelvis are not included within the field of view. Superimposed structures obscure visualization of some of the left upper quadrant. Visualized bowel gas pattern is normal without significant colonic or small bowel distention. No radiopaque stones. Degenerative changes in the spine.  IMPRESSION: Limited study.  No evidence of bowel obstruction.   Electronically Signed   By: Lucienne Capers M.D.   On: 05/21/2015 00:00    ECG  Junctional bradycardia on 06/05/2015  Telemetry: A-fib with HR high 90s-110s  ASSESSMENT AND PLAN  1. Proxysmal atrial fibrillation on chronic coumadin  - CHA2DS2-VASc score 6 (female, age, HTN, DM, CAD)  - INR supratherapeutic on arrival, improved  - HR improving on IV amiodarone, transitioned to PO amio today, 400mg  BID for 7 days, then 200mg  BID for 1 month, then 200mg  daily afterward  - will restart on 25mg  BID metoprolol for rate control. Will hold diltiazem given recent junctional bradycardia with HR 20-40s  2. Hyperkalemia: hold ACEI and spironolactone  3. Junctional bradycardia with HR 20-40s on arrival - resolved  4. Respiratory failure with bilateral pleural effusion, R > L  - need to monitor for sign of HF  5. Nonobstructive CAD on cath 2008  6. HTN 7. IDDM 8. OSA intolerant to CPAP 9. Acute on chronic renal  insufficiency: recently admitted for AKI requiring temp dialysis 10. Gallstone with gallbladder wall thickening: no convincing evidence based on history or physical exam for cholecystitis 11. Elevated transaminase: improving  Signed, Almyra Deforest, PA-C 06/07/2015, 11:38 AM   I have personally seen and examined this patient with Almyra Deforest, PA-C. I agree with the assessment and plan as outlined above. She better rate controlled on amiodarone. Will change to  po amiodarone today and restart low dose lopressor 25 mg po BID. Her junctional bradycardia was likely due to her hyperkalemia on admission. Continue to follow on telemetry. We can adjust her medications over the next few days. Continue coumadin.   Farhad Burleson 06/07/2015 12:12 PM

## 2015-06-07 NOTE — Progress Notes (Signed)
ANTICOAGULATION CONSULT NOTE - Initial Consult  Pharmacy Consult for Coumadin Indication: atrial fibrillation  No Known Allergies  Patient Measurements: Height: 5\' 7"  (170.2 cm) Weight: 134 lb 0.6 oz (60.8 kg) IBW/kg (Calculated) : 61.6  Vital Signs: Temp: 98.1 F (36.7 C) (07/01 0700) Temp Source: Oral (07/01 0700) BP: 115/74 mmHg (07/01 0900) Pulse Rate: 97 (07/01 0800)  Labs:  Recent Labs  06/05/15 0427 06/05/15 0438 06/05/15 1007 06/05/15 1230 06/05/15 2024 06/06/15 0247 06/07/15 0235  HGB 10.3* 11.6*  --   --   --  9.6* 9.3*  HCT 31.7* 34.0*  --   --   --  27.8* 27.6*  PLT 162  --   --   --   --  197 194  APTT  --   --   --   --   --  37  --   LABPROT 43.2*  --   --  39.1*  --  35.6*  --   INR 4.74*  --   --  4.15*  --  3.67*  --   CREATININE 1.73* 1.50* 1.58*  --   --  1.74* 1.47*  TROPONINI  --   --  <0.03 <0.03 <0.03  --   --     Estimated Creatinine Clearance: 25.9 mL/min (by C-G formula based on Cr of 1.47).   Medical History: Past Medical History  Diagnosis Date  . OSA (obstructive sleep apnea)     AHI-44/hr, AHI during REM 61.8/hr, avg O2 during REM and NREM 96%  . Atrial fibrillation   . Myocardial infarct   . Diabetes mellitus   . Hearing loss   . Hypertension   . Hyperlipidemia   . Senile degeneration of brain   . Other generalized ischemic cerebrovascular disease   . Personal history of fall   . Pain in joint, shoulder region   . 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 Loss of weight   . Chronic kidney disease, stage II (mild)   . Obstructive sleep apnea (adult) (pediatric)   . Senile degeneration of brain   . Dizziness and giddiness   . Personal history of noncompliance with medical treatment, presenting hazards to health   . Dyskinesia of esophagus   . Shortness of breath   . Chest pain, unspecified 06/19/2009  . Long term (current) use of anticoagulants   . Unspecified hereditary and idiopathic peripheral  neuropathy   . Pain in joint, site unspecified   . Carpal tunnel syndrome   . Other hammer toe (acquired)   . Alopecia areata   . Dysphagia, unspecified(787.20)   . Occlusion and stenosis of carotid artery without mention of cerebral infarction   . Other symptoms involving cardiovascular system   . Other acne   . Allergic rhinitis, cause unspecified   . Type II or unspecified type diabetes mellitus without mention of complication, uncontrolled   . Varicose veins of lower extremities   . Cervicalgia   . Disturbance of salivary secretion   . Insomnia, unspecified   . Unspecified hypothyroidism   . Palpitations 715.90  . Other malaise and fatigue   . Diverticulosis of colon (without mention of hemorrhage)   . Benign neoplasm of colon   . Depressive disorder, not elsewhere classified   . Syncope and collapse   . Paroxysmal atrial fibrillation   . Hypertension   . Dizziness    Assessment: 81 yo F presents on 6/29 with acute encephalopathy after just being discharged for renal  failure while being treated with mechanical ventilation and HD. On chronic coumadin for Afib. Held since admission due to supratherapeutic INR of 3.67 on 6/29. INR today is now therapeutic at 2.65. Hgb low but stable at 9.3 and plts wnl. No s/s of bleed.  PTA Coumadin is 5mg  daily exc 7.5mg  on MWF (Last [facility] clinic visit was 6/27 with INR of 2.2)  Goal of Therapy:  INR 2-3 Monitor platelets by anticoagulation protocol: Yes   Plan:  Give coumadin 7.5mg  PO x 1 Monitor daily INR, CBC, s/s of bleed  Tasman [name] 06/07/2015,9:34 AM

## 2015-06-07 NOTE — Progress Notes (Addendum)
PULMONARY / CRITICAL CARE MEDICINE   Name: KAYCE CHISMAR MRN: [id number] DOB: 1925/08/29    ADMISSION DATE:  06/05/2015 CONSULTATION DATE:  06/05/2015  REFERRING MD :  Dina Rich, EDP  CHIEF COMPLAINT:  Acute encephalopathy  INITIAL PRESENTATION:  81 y/o female with a history of atrial fib and a recent hospitalization for AKI was admitted on 6/29 from the [facility] ED with bradycardia due to hyperkalemia and acidosis.  MAJOR EVENTS/TEST RESULTS: 6/29 CTAP: There is a loop of mildly distended fluid filled small bowel in the central pelvis with associated small bowel feces sign. Interloop mesenteric fluid is associated. Given the configuration, closed loop obstruction is a consideration although this is difficult to confirm without oral or IV contrast. Distention from early mechanical small bowel obstruction would also be a consideration 6/30 KUB: negative  INDWELLING DEVICES:: ETT 6/29 >> 6/30  MICRO DATA: Urine 6/29 >> NEG Resp 6/29 >>  Blood 6/29 >>    ANTIMICROBIALS:  Vanc 6/29 >> 6/30 Pip-tazo 6/29 >> 6/30  SUBJECTIVE:  NAD. No complaints. Tolerating diet  VITAL SIGNS: Temp:  [98.1 F (36.7 C)-98.8 F (37.1 C)] 98.8 F (37.1 C) (07/01 1405) Pulse Rate:  [69-116] 116 (07/01 1405) Resp:  [13-27] 18 (07/01 1405) BP: (104-162)/(59-116) 151/80 mmHg (07/01 1405) SpO2:  [93 %-100 %] 100 % (07/01 1405) HEMODYNAMICS:   VENTILATOR SETTINGS:   INTAKE / OUTPUT:  Intake/Output Summary (Last 24 hours) at 06/07/15 1818 Last data filed at 06/07/15 1758  Gross per 24 hour  Intake 828.84 ml  Output   1847 ml  Net -1018.16 ml    PHYSICAL EXAMINATION: General:  RASS 0, + F/C Neuro: No focal deficits HEENT: NCAT, WNL Cardiovascular: Reg, no M Lungs:  Clear anteriorly Abdomen: soft, +BS Ext: no edema, warm  LABS:  CBC  Recent Labs Lab 06/05/15 0427 06/05/15 0438 06/06/15 0247 06/07/15 0235  WBC 13.0*  --  13.8* 10.3  HGB 10.3* 11.6* 9.6* 9.3*  HCT 31.7* 34.0* 27.8*  27.6*  PLT 162  --  197 194   Coag's  Recent Labs Lab 06/05/15 1230 06/06/15 0247 06/07/15 1000  APTT  --  37  --   INR 4.15* 3.67* 2.65*   BMET  Recent Labs Lab 06/05/15 1007 06/06/15 0247 06/07/15 0235  NA 138 138 138  K 4.4 4.0 3.6  CL 105 105 106  CO2 20* 23 23  BUN 18 17 13   CREATININE 1.58* 1.74* 1.47*  GLUCOSE 159* 143* 142*   Electrolytes  Recent Labs Lab 06/05/15 1007 06/06/15 0247 06/07/15 0235  CALCIUM 9.3 8.4* 8.3*  MG  --  1.2*  --    Sepsis Markers  Recent Labs Lab 06/05/15 0438 06/05/15 0900 06/05/15 1007 06/07/15 0235  LATICACIDVEN 10.74* 3.5*  --   --   PROCALCITON  --   --  0.45 0.85   ABG  Recent Labs Lab 06/05/15 0519 06/06/15 0450  PHART 7.273* 7.529*  PCO2ART 31.6* 26.7*  PO2ART 102.0* 88.6   Liver Enzymes  Recent Labs Lab 06/05/15 0427 06/07/15 0235  AST 905* 253*  ALT 441* 397*  ALKPHOS 338* 261*  BILITOT 0.9 0.3  ALBUMIN 2.5* 2.6*   Cardiac Enzymes  Recent Labs Lab 06/05/15 1007 06/05/15 1230 06/05/15 2024  TROPONINI <0.03 <0.03 <0.03   Glucose  Recent Labs Lab 06/06/15 1551 06/06/15 1910 06/06/15 2159 06/07/15 0844 06/07/15 1251 06/07/15 1614  GLUCAP 119* 247* 157* 121* 182* 106*    CXR: NSC small R effusion  ASSESSMENT / PLAN:  PULMONARY A:  VDRF due to AMS, resolved P:   Cont supp O2 as needed  CARDIOVASCULAR  A:  Profound bradycardia due to hyperkalemia, resolved PAF with RVR Hypotension, resolved Lactic acidosis - resolved Hypertension, resolved P:  Holding Metoprolol and Cardizem Cont telemetry monitoring Amiodarone 6/30  Change to PO 7/01 Warfarin per pharmacy Cards consult requested 7/01  RENAL A:   AKI, nonoliguric - Cr improving Metabolic acidosis, resolved Hyperkalemia - resolved P:   Monitor BMET intermittently Monitor I/Os Correct electrolytes as indicated Would avoid all ACEI and ARBs in the future Off spironolactone - would avoid in  future  GASTROINTESTINAL A:   Abdominal pain, resolved P:   SUP: N/I Cont heart/carb mod diet  HEMATOLOGIC A:   Prolonged prothrombin time due to warfarin P:  DVT px: warfarin per pharm for AF Monitor CBC intermittently Transfuse per usual guidelines  INFECTIOUS A:   No overt infections P:   Micro and abx as above  ENDOCRINE A:  DM 2 P:   Cont ACHS SSI  NEUROLOGIC A:   Acute encephalopathy, resolved P:   RASS goal: 0 Minimize sedation/opiates   Transfer to med-surg floor. TRH to assume care as of AM 7/02 and PCCM off. Discussed with Dr Nicolette Bang, MD ; [facility] [phone].  After 5:30 PM or weekends, call [phone]   06/07/2015, 6:18 PM

## 2015-06-08 DIAGNOSIS — I482 Chronic atrial fibrillation: Secondary | ICD-10-CM

## 2015-06-08 DIAGNOSIS — J9621 Acute and chronic respiratory failure with hypoxia: Secondary | ICD-10-CM

## 2015-06-08 DIAGNOSIS — I4891 Unspecified atrial fibrillation: Secondary | ICD-10-CM

## 2015-06-08 LAB — BASIC METABOLIC PANEL
Anion gap: 9 (ref 5–15)
BUN: 14 mg/dL (ref 6–20)
CO2: 24 mmol/L (ref 22–32)
CREATININE: 1.18 mg/dL — AB (ref 0.44–1.00)
Calcium: 8.7 mg/dL — ABNORMAL LOW (ref 8.9–10.3)
Chloride: 105 mmol/L (ref 101–111)
GFR calc Af Amer: 47 mL/min — ABNORMAL LOW (ref >60)
GFR, EST NON AFRICAN AMERICAN: 40 mL/min — AB (ref >60)
GLUCOSE: 131 mg/dL — AB (ref 65–99)
Potassium: 3.8 mmol/L (ref 3.5–5.1)
Sodium: 138 mmol/L (ref 135–145)

## 2015-06-08 LAB — GLUCOSE, CAPILLARY
Glucose-Capillary: 116 mg/dL — ABNORMAL HIGH (ref 65–99)
Glucose-Capillary: 163 mg/dL — ABNORMAL HIGH (ref 65–99)
Glucose-Capillary: 107 mg/dL — ABNORMAL HIGH (ref 65–99)
Glucose-Capillary: 107 mg/dL — ABNORMAL HIGH (ref 65–99)

## 2015-06-08 LAB — CBC
HCT: 29.9 % — ABNORMAL LOW (ref 36.0–46.0)
Hemoglobin: 10 g/dL — ABNORMAL LOW (ref 12.0–15.0)
MCH: 32.5 pg (ref 26.0–34.0)
MCHC: 33.4 g/dL (ref 30.0–36.0)
MCV: 97.1 fL (ref 78.0–100.0)
PLATELETS: 219 10*3/uL (ref 150–400)
RBC: 3.08 MIL/uL — AB (ref 3.87–5.11)
RDW: 15.6 % — ABNORMAL HIGH (ref 11.5–15.5)
WBC: 8.1 10*3/uL (ref 4.0–10.5)

## 2015-06-08 LAB — CULTURE, RESPIRATORY: CULTURE: NO GROWTH

## 2015-06-08 LAB — CULTURE, RESPIRATORY W GRAM STAIN

## 2015-06-08 LAB — PROTIME-INR
INR: 2.23 — AB (ref 0.00–1.49)
PROTHROMBIN TIME: 24.5 s — AB (ref 11.6–15.2)

## 2015-06-08 MED ORDER — WARFARIN SODIUM 5 MG PO TABS
5.0000 mg | ORAL_TABLET | Freq: Once | ORAL | Status: AC
  Administered 2015-06-08: 5 mg via ORAL
  Filled 2015-06-08: qty 1

## 2015-06-08 NOTE — Progress Notes (Signed)
Upon shift assessment, it was noted that patient's LUE has +2 edema-worsening in her forearm and hand to +3 edema.  She also has significant ecchymosis to entire extremity, ranging in color from reddish/pink to the upper part of her arm to dark purple from her wrist through her hand. Patient also had c/o tenderness to her posterior hand during assessment and grimaces with minimal movement to that extremity. +2 Radial pulse noted during assessment with good sensation. LUE remains warm to touch and good capillary refill at this time.  Patient is unable to tell how or when this happened, only that she "thinks its getting better"-although patient is oriented only to herself at this time and is a poor historian.  Extremity immediately elevated and "Extremity Restricted" bracelet placed to prevent any blood sticks or pressures to this arm.  Critical Care MD also notified.  Per MD, NP will come to assess. Will continue to monitor. Tresa Endo

## 2015-06-08 NOTE — Progress Notes (Signed)
1744 resting comfortably . In  Bed . Safety precaution maintained.Robyne Askew attended

## 2015-06-08 NOTE — Progress Notes (Signed)
PATIENT DETAILS Name: [name] Age: 81 y.o. Sex: female Date of Birth: Nov 07, 1925 Admit Date: 06/05/2015 Admitting Physician Juanito Doom, MD WHQ:[id number], Viviann Spare, MD  Brief narrative: 81 y/o female with a history of atrial fib and a recent hospitalization for AKI was admitted on 6/29 from the [facility] ED with junctional bradycardia, acute encephalopathy due to hyperkalemia/acidosis. Patient was intubated on admission and admitted to the intensive care unit. Upon correction of electrolytes, bradycardia resolved, patient was extubated on 6/30. His hospital course was complicated by development of atrial fibrillation requiring amiodarone infusion. Upon stabilization, she was transferred to the hospitalist service on 7/2.   Subjective: Left arm-decreased ecchymosis. Breathing much better needed no other complaints.  Assessment/Plan: Active Problems: Junctional bradycardia Bradycardia: Due to hyperkalemia-now resolved.  Ventilator dependent respiratory failure: due to AMS. Extubated 6/30, doing well post extubation. Now on room air.  Acute encephalopathy: Likely metabolic encephalopathy. Resolved. Awake and alert currently.  Hyperkalemia/metabolic acidosis: Likely secondary to lisinopril/Aldactone/renal failure/metformin. Avoid ACEI/ARBs/Aldactone in the future (added to allergy list)  Atrial fibrillation with rapid ventricular response: Back in sinus rhythm. Required IV amiodarone, now transitioned to oral amiodarone. Also restarted on metoprolol. Continue Coumadin-CHA2DS2-VASc score 6. Cardiology following  AKI: Suspect prerenal azotemia, resolving. In the most recent hospitalization last month developed severe ARF requiring temporary hemodialysis. Will need to avoid ACEI/ARBs/Aldactone in the future (added to allergy list)  Hypotension: Resolved. Likely secondary to hypovolemia/volume depletion. No indication of any infection-blood culture negative so  far.  Elevated liver enzymes: Likely shock liver. LFTs downtrending-follow.  Hypothyroidism: Continue with levothyroxine  Type 2 diabetes: CBGs stable, continue SSI. Last A1c 6.4-probably could be switched to a alternative agent other than metformin on discharge  Abdominal pain: Resolved. Tolerating diet, last being earlier this morning. CT abdomen on 6/51-month closed loop obstruction-doubt clinically-since Belly is benign and tolerating diet-doubt further workup is required at this time.  Left upper extremity swelling from ecchymosis: Improving. Supportive care.  Deconditioning: PT eval completed. Home health PT on discharge.  Disposition: Remain inpatient-home in the next 1-2 days  Antimicrobial agents  See below  Anti-infectives    Start     Dose/Rate Route Frequency Ordered Stop   06/07/15 0600  vancomycin (VANCOCIN) 1,500 mg in sodium chloride 0.9 % 500 mL IVPB  Status:  Discontinued     1,500 mg 250 mL/hr over 120 Minutes Intravenous Every 48 hours 06/05/15 0711 06/05/15 0952   06/06/15 0530  vancomycin (VANCOCIN) 500 mg in sodium chloride 0.9 % 100 mL IVPB  Status:  Discontinued     500 mg 100 mL/hr over 60 Minutes Intravenous Every 24 hours 06/05/15 0952 06/06/15 0941   06/05/15 1400  piperacillin-tazobactam (ZOSYN) IVPB 3.375 g  Status:  Discontinued     3.375 g 12.5 mL/hr over 240 Minutes Intravenous 3 times per day 06/05/15 0711 06/06/15 0941   06/05/15 0500  piperacillin-tazobactam (ZOSYN) IVPB 3.375 g     3.375 g 100 mL/hr over 30 Minutes Intravenous  Once 06/05/15 0445 06/05/15 0638   06/05/15 0500  vancomycin (VANCOCIN) IVPB 1000 mg/200 mL premix     1,000 mg 200 mL/hr over 60 Minutes Intravenous  Once 06/05/15 0445 06/05/15 3846      DVT Prophylaxis: Coumadin  Code Status:  DNR  Family Communication None at bedside  Procedures: ETT 6/29 >> 6/30  CONSULTS:  cardiology and pulmonary/intensive care  Time spent 30 minutes-Greater than  50% of this  time was spent in counseling, explanation of diagnosis, planning of further management, and coordination of care.  MEDICATIONS: Scheduled Meds: . amiodarone  400 mg Oral BID  . calcium gluconate 1 GM IV  1 g Intravenous Once  . feeding supplement (ENSURE ENLIVE)  237 mL Oral BID BM  . insulin aspart  0-9 Units Subcutaneous TID WC  . levothyroxine  50 mcg Oral QAC breakfast  . metoprolol tartrate  25 mg Oral BID  . Warfarin - Pharmacist Dosing Inpatient   Does not apply q1800   Continuous Infusions: . sodium chloride     PRN Meds:.sodium chloride, acetaminophen, ondansetron (ZOFRAN) IV    PHYSICAL EXAM: Vital signs in last 24 hours: Filed Vitals:   06/07/15 1405 06/07/15 2138 06/08/15 0206 06/08/15 0621  BP: 151/80 180/78 169/85 155/89  Pulse: 116 66 80 78  Temp: 98.8 F (37.1 C) 98.6 F (37 C) 98.4 F (36.9 C) 98 F (36.7 C)  TempSrc: Oral Oral Oral Oral  Resp: 18 18 18 18   Height:      Weight:    59.848 kg (131 lb 15.1 oz)  SpO2: 100% 97% 98% 100%    Weight change:  Filed Weights   06/05/15 0423 06/06/15 0500 06/08/15 0621  Weight: 54.432 kg (120 lb) 60.8 kg (134 lb 0.6 oz) 59.848 kg (131 lb 15.1 oz)   Body mass index is 20.66 kg/(m^2).   Gen Exam: Awake and alert with clear speech. Looks frail chronically ill-appearing Neck: Supple, No JVD.  Chest: B/L Clear.   CVS: S1 S2 Regular.  Abdomen: soft, BS +, non tender, non distended.  Extremities: no edema, lower extremities warm to touch. Left upper extremity-mildly swollen, ecchymosis involving most of the forearm and dorsal wrist. Compartments do not appear tense, good capillary refill. Neurologic: Non Focal.   Skin: No Rash.   Wounds: N/A.    Intake/Output from previous day:  Intake/Output Summary (Last 24 hours) at 06/08/15 1105 Last data filed at 06/08/15 1000  Gross per 24 hour  Intake    960 ml  Output   1431 ml  Net   -471 ml     LAB RESULTS: CBC  Recent Labs Lab 06/05/15 0427 06/05/15 0438  06/06/15 0247 06/07/15 0235 06/08/15 0313  WBC 13.0*  --  13.8* 10.3 8.1  HGB 10.3* 11.6* 9.6* 9.3* 10.0*  HCT 31.7* 34.0* 27.8* 27.6* 29.9*  PLT 162  --  197 194 219  MCV 103.6*  --  97.5 97.2 97.1  MCH 33.7  --  33.7 32.7 32.5  MCHC 32.5  --  34.5 33.7 33.4  RDW 15.9*  --  15.5 15.7* 15.6*  LYMPHSABS 3.0  --   --   --   --   MONOABS 0.5  --   --   --   --   EOSABS 0.1  --   --   --   --   BASOSABS 0.0  --   --   --   --     Chemistries   Recent Labs Lab 06/05/15 0427 06/05/15 0438 06/05/15 1007 06/06/15 0247 06/07/15 0235 06/08/15 0313  NA 131* 132* 138 138 138 138  K 5.8* 5.7* 4.4 4.0 3.6 3.8  CL 102 105 105 105 106 105  CO2 9*  --  20* 23 23 24   GLUCOSE 399* 394* 159* 143* 142* 131*  BUN 16 19 18 17 13 14   CREATININE 1.73* 1.50* 1.58* 1.74* 1.47* 1.18*  CALCIUM  7.8*  --  9.3 8.4* 8.3* 8.7*  MG  --   --   --  1.2*  --   --     CBG:  Recent Labs Lab 06/07/15 0844 06/07/15 1251 06/07/15 1614 06/07/15 2143 06/08/15 0625  GLUCAP 121* 182* 106* 139* 116*    GFR Estimated Creatinine Clearance: 31.7 mL/min (by C-G formula based on Cr of 1.18).  Coagulation profile  Recent Labs Lab 06/05/15 0427 06/05/15 1230 06/06/15 0247 06/07/15 1000 06/08/15 0313  INR 4.74* 4.15* 3.67* 2.65* 2.23*    Cardiac Enzymes  Recent Labs Lab 06/05/15 1007 06/05/15 1230 06/05/15 2024  TROPONINI <0.03 <0.03 <0.03    Invalid input(s): POCBNP No results for input(s): DDIMER in the last 72 hours. No results for input(s): HGBA1C in the last 72 hours. No results for input(s): CHOL, HDL, LDLCALC, TRIG, CHOLHDL, LDLDIRECT in the last 72 hours. No results for input(s): TSH, T4TOTAL, T3FREE, THYROIDAB in the last 72 hours.  Invalid input(s): FREET3 No results for input(s): VITAMINB12, FOLATE, FERRITIN, TIBC, IRON, RETICCTPCT in the last 72 hours. No results for input(s): LIPASE, AMYLASE in the last 72 hours.  Urine Studies No results for input(s): UHGB, CRYS in the  last 72 hours.  Invalid input(s): UACOL, UAPR, USPG, UPH, UTP, UGL, UKET, UBIL, UNIT, UROB, ULEU, UEPI, UWBC, URBC, UBAC, CAST, UCOM, BILUA  MICROBIOLOGY: Recent Results (from the past 240 hour(s))  Urine culture     Status: None   Collection Time: 06/05/15  4:56 AM  Result Value Ref Range Status   Specimen Description URINE, CATHETERIZED  Final   Special Requests NONE  Final   Culture NO GROWTH 1 DAY  Final   Report Status 06/06/2015 FINAL  Final  Blood culture (routine x 2)     Status: None (Preliminary result)   Collection Time: 06/05/15  5:11 AM  Result Value Ref Range Status   Specimen Description BLOOD HAND LEFT  Final   Special Requests BOTTLES DRAWN AEROBIC ONLY 5CC  Final   Culture NO GROWTH 2 DAYS  Final   Report Status PENDING  Incomplete  Culture, respiratory (NON-Expectorated)     Status: None (Preliminary result)   Collection Time: 06/05/15  2:34 PM  Result Value Ref Range Status   Specimen Description TRACHEAL ASPIRATE  Final   Special Requests NONE  Final   Gram Stain   Final    FEW WBC PRESENT, PREDOMINANTLY MONONUCLEAR RARE SQUAMOUS EPITHELIAL CELLS PRESENT NO ORGANISMS SEEN Performed at [facility]    Culture   Final    NO GROWTH 1 DAY Performed at [facility]    Report Status PENDING  Incomplete    RADIOLOGY STUDIES/RESULTS: Ct Abdomen Pelvis Wo Contrast  06/05/2015   CLINICAL DATA:  Initial encounter for sepsis of uncertain etiology with acidosis and abdominal pain.  EXAM: CT ABDOMEN AND PELVIS WITHOUT CONTRAST  TECHNIQUE: Multidetector CT imaging of the abdomen and pelvis was performed following the standard protocol without IV contrast.  COMPARISON:  05/21/2015.  FINDINGS: Lower chest: Bibasilar collapse/ consolidation with small to moderate right pleural effusion and small left pleural effusion.  Hepatobiliary: No focal abnormality is seen in the liver on this uninfused exam. Gallbladder wall appears thickened. Multiple gallstones are  evident, measuring up to 13 mm in diameter. No substantial intra or extrahepatic biliary duct dilatation.  Pancreas: Poorly discriminated given the lack of intravenous contrast and body edema/ascites. No dilatation of the main duct is evident.  Spleen: Unremarkable.  Adrenals/Urinary Tract:  Thickening of both adrenal glands is noted. There is no hydronephrosis in either kidney. No evidence for hydroureter current Foley catheter decompresses the urinary bladder.  Stomach/Bowel: NG tube tip is tenting the greater curvature of the stomach. Stomach is decompressed. Duodenum is nondistended. No evidence for small bowel obstruction. Therein is may fluid filled slightly distended small bowel loop in the central pelvis (see image 67 series 2) measuring 3 cm in diameter. There is some interloop mesenteric fluid associated with this loop and small bowel feces sign is evident. There is diverticular change in the colon without evidence for overt diverticulitis.  Vascular/Lymphatic: There is abdominal aortic atherosclerosis without aneurysm. No gross lymphadenopathy is seen in the abdomen or pelvis.  Reproductive: Uterus is surgically absent. No evidence for adnexal mass.  Other: Small volume intraperitoneal free fluid is evident.  Musculoskeletal: Diffuse body wall edema noted. Bone windows reveal no worrisome lytic or sclerotic osseous lesions.  IMPRESSION: There is a loop of mildly distended fluid filled small bowel in the central pelvis with associated small bowel feces sign. Interloop mesenteric fluid is associated. Given the configuration, closed loop obstruction is a consideration although this is difficult to confirm without oral or IV contrast. Distention from early mechanical small bowel obstruction would also be a consideration. Repeat CT imaging after administration of oral contrast may prove helpful to determine whether or not this loop opacifies.  Slight increase in pleural effusions.  Marked gallbladder wall  thickening with gallstones evident. If there is clinical concern for cholecystitis, nuclear scintigraphy may prove helpful to further evaluate.   Electronically Signed   By: Misty Stanley M.D.   On: 06/05/2015 08:18   Ct Abdomen Pelvis Wo Contrast  05/21/2015   CLINICAL DATA:  Sepsis.  GI bleed.  Diarrhea.  Renal failure.  EXAM: CT ABDOMEN AND PELVIS WITHOUT CONTRAST  TECHNIQUE: Multidetector CT imaging of the abdomen and pelvis was performed following the standard protocol without IV contrast.  COMPARISON:  12/15/2003  FINDINGS: There is a moderate right pleural effusion and a small left pleural effusion. There is a small amount of ascites. Hepatic veins are somewhat distended as is the inferior vena cava consistent with elevated right heart pressure.  No focal liver lesions. There are multiple stones in the gallbladder and the gallbladder is quite distended. No dilated bile ducts. Spleen, pancreas, and adrenal glands are normal. There is a single tiny stone in the mid right kidney and there are several tiny stones in the left kidney. No hydronephrosis. There is ascites peripheral to Gerota's fascia bilaterally. There is a tiny hiatal hernia. There are few diverticula in the distal colon but there is no evidence of diverticulitis. The mucosa of the distal sigmoid and rectum appears slightly prominent which could represent proctitis. The mucosa of the remainder of the colon appears normal. Terminal ileum and small bowel are normal.  Extensive calcification in the abdominal aorta and iliac arteries. Foley catheter in the bladder. Previous removal of the uterus and ovaries. No acute osseous abnormality.  IMPRESSION: 1. Bilateral pleural effusions, moderate on the right and small on the left. 2. Ascites. 3. Distended gallbladder with multiple gallstones. 4. Tiny bilateral renal stones. 5. Slight prominence of the mucosa in the rectum and distal sigmoid. This could represent proctitis.   Electronically Signed   By:  Lorriane Shire M.D.   On: 05/21/2015 10:33   Dg Chest 2 View  05/28/2015   CLINICAL DATA:  Shortness of Breath  EXAM: CHEST  2 VIEW  COMPARISON:  May 25, 2015  FINDINGS: Central catheter has been removed. No pneumothorax. There is stable airspace consolidation in the left lower lobe. There are small pleural effusions bilaterally. There is patchy atelectasis in the right upper lobe, stable. Heart is upper normal in size with pulmonary vascularity within normal limits. No adenopathy. No bone lesions.  IMPRESSION: Persistent left lower lobe consolidation. Small bilateral effusions. Stable atelectasis right upper lobe. No change in cardiac silhouette.   Electronically Signed   By: Lowella Grip III M.D.   On: 05/28/2015 08:57   Dg Abd 1 View  06/06/2015   CLINICAL DATA:  Abdominal distention  EXAM: ABDOMEN - 1 VIEW  COMPARISON:  CT 06/05/2015  FINDINGS: The bowel gas pattern is normal. No radio-opaque calculi or other significant radiographic abnormality are seen.  IMPRESSION: Negative.   Electronically Signed   By: Conchita Paris M.D.   On: 06/06/2015 10:27   Ct Head Wo Contrast  05/21/2015   CLINICAL DATA:  Altered mental status  EXAM: CT HEAD WITHOUT CONTRAST  TECHNIQUE: Contiguous axial images were obtained from the base of the skull through the vertex without intravenous contrast.  COMPARISON:  10/13/2012  FINDINGS: Insert motion images were repeated. Mild cortical volume loss noted with proportional ventricular prominence. Areas of periventricular white matter hypodensity are most compatible with small vessel ischemic change. No acute hemorrhage, infarct, or mass lesion is identified. Orbits and paranasal sinuses are unremarkable. No skull fracture.  IMPRESSION: No acute intracranial abnormality. Stable degree of cortical volume loss.   Electronically Signed   By: Conchita Paris M.D.   On: 05/21/2015 10:01   US Abdomen Complete  05/21/2015   CLINICAL DATA:  Abdominal pain.  Gallstones.  EXAM:  ULTRASOUND ABDOMEN COMPLETE  COMPARISON:  CT abdomen and pelvis earlier this same day.  FINDINGS: Gallbladder: Stones are identified within the gallbladder measuring up to 1.5 cm. The gallbladder wall is thickened at 0.7 cm. Sonographer reports negative Murphy's sign.  Common bile duct: Diameter: 0.3 cm.  Liver: No focal lesion identified. Within normal limits in parenchymal echogenicity.  IVC: No abnormality visualized.  Pancreas: Visualized portion unremarkable.  Spleen: Size and appearance within normal limits.  Right Kidney: Length: 10.9 cm. Echogenicity within normal limits. No hydronephrosis visualized. A 0.8 cm hyperechoic lesion in the lower pole of the left kidney cannot be definitively characterized but likely represents a complex cyst.  Left Kidney: Length: 10.9 cm. Echogenicity within normal limits. No mass or hydronephrosis visualized.  Abdominal aorta: No aneurysm visualized.  Other findings: There is a small volume of abdominal ascites. Bilateral pleural effusions are seen, greater on the right.  IMPRESSION: Gallstones without evidence of cholecystitis. Gallbladder wall thickening is likely related to small volume of abdominal ascites.  Bilateral pleural effusions.   Electronically Signed   By: Inge Rise M.D.   On: 05/21/2015 14:04   Dg Chest Port 1 View  06/07/2015   CLINICAL DATA:  Pleural effusions.  Atrial fibrillation.  EXAM: PORTABLE CHEST - 1 VIEW  COMPARISON:  June 06, 2015  FINDINGS: Endotracheal tube and nasogastric tube have been removed. No pneumothorax. There is a layering right pleural effusion. There is a smaller left effusion. No airspace consolidation is appreciable. Heart is upper normal in size with pulmonary vascularity within normal limits. No adenopathy.  IMPRESSION: Bilateral pleural effusions, right larger than left. These effusions do not appear significantly changed compared to 1 day prior. There is no pneumothorax. No edema or consolidation appreciable.  Electronically Signed   By: Lowella Grip III M.D.   On: 06/07/2015 07:48   Dg Chest Port 1 View  06/06/2015   CLINICAL DATA:  Follow-up of respiratory failure  EXAM: PORTABLE CHEST - 1 VIEW  COMPARISON:  Portable chest x-ray of June 05, 2015  FINDINGS: The lungs are well-expanded. The hemidiaphragms and costophrenic angles are excluded from the field of view however. There is increased density at the right lung base likely reflecting pleural fluid layering posteriorly. There is no alveolar infiltrate. The cardiac silhouette is top-normal in size. The pulmonary vascularity is not engorged. The mediastinum is normal in width. The endotracheal tube tip lies 3.8 cm above the carina. The esophagogastric tube tip projects below the inferior margin of the image.  IMPRESSION: COPD and probable right pleural effusion. No alveolar pneumonia is demonstrated. The support tubes are in reasonable position.   Electronically Signed   By: David  Martinique M.D.   On: 06/06/2015 07:25   Dg Chest Port 1 View  06/05/2015   CLINICAL DATA:  Dyspnea.  Intubation.  EXAM: PORTABLE CHEST - 1 VIEW  COMPARISON:  06/05/2015 at 04:49  FINDINGS: The endotracheal tube is 4.5 cm above the carina. The nasogastric tube extends into the stomach. There is no interval change in the hazy opacities bilaterally, consistent with alveolar edema and/or effusions. There is no pneumothorax.  IMPRESSION: Support equipment appears satisfactorily positioned.  No significant interval change in the bilateral airspace opacities.   Electronically Signed   By: Andreas Newport M.D.   On: 06/05/2015 06:00   Dg Chest Portable 1 View  06/05/2015   CLINICAL DATA:  Bradycardia and hypotension. History of diabetes, hypertension, myocardial infarction, dementia.  EXAM: PORTABLE CHEST - 1 VIEW  COMPARISON:  Chest radiograph May 28, 2015  FINDINGS: The cardiac silhouette appears mildly enlarged, similar. Tortuous calcified aorta. Hazy densities in lung bases favor  layering pleural effusion. Similar pulmonary vascular congestion mild interstitial prominence. No pneumothorax. Pacer pad overlies the central and lower chest. Multiple EKG lines overlie the patient and may obscure subtle underlying pathology. Soft tissue planes and included osseous structures are nonsuspicious. Mild vascular calcifications RIGHT axilla.  IMPRESSION: Mild cardiomegaly, layering pleural effusions with mild interstitial prominence favoring pulmonary edema.   Electronically Signed   By: Elon Alas M.D.   On: 06/05/2015 05:35   Dg Chest Port 1 View  05/25/2015   CLINICAL DATA:  Respiratory failure.  EXAM: PORTABLE CHEST - 1 VIEW  COMPARISON:  05/23/2015  FINDINGS: Improved aeration in the lungs suggest decreased edema and possibly decreased pleural effusions. There continues to be hazy densities at the lung bases with opacification in the retrocardiac space. The nasogastric tube and endotracheal tube have been removed. Right jugular central venous catheter in the SVC region and stable. Negative for a pneumothorax.  IMPRESSION: Improved aeration in the lungs suggests decreasing pulmonary edema and possibly decreasing pleural effusions. There continues to be basilar densities which may represent residual pleural fluid and left basilar consolidation.  Support apparatuses as described.   Electronically Signed   By: Markus Daft M.D.   On: 05/25/2015 09:24   Dg Chest Port 1 View  05/23/2015   CLINICAL DATA:  Respiratory failure.  EXAM: PORTABLE CHEST - 1 VIEW  COMPARISON:  05/22/2015.  FINDINGS: Endotracheal tube, NG tube, right IJ line in stable position. Mediastinum hilar structures are stable. Heart size stable. Bilateral pulmonary infiltrates again noted. Slight interim improvement. Persistent small right pleural effusion. No pneumothorax.  IMPRESSION:  1. Lines and tubes in stable position. 2. Bilateral pulmonary infiltrates remain. There has been slight improvement. Persistent right pleural  effusion.   Electronically Signed   By: Marcello Moores  Register   On: 05/23/2015 07:24   Dg Chest Port 1 View  05/22/2015   CLINICAL DATA:  Hypoxia  EXAM: PORTABLE CHEST - 1 VIEW  COMPARISON:  Study obtained earlier in the day.  FINDINGS: Endotracheal tube tip is 3.5 cm above the carina. Nasogastric tube tip and side port are below the diaphragm. Central catheter tip is in the superior cava. No pneumothorax. There is interstitial edema with bilateral effusions. Heart is mildly enlarged with pulmonary venous hypertension. No bone lesions.  IMPRESSION: Tube and catheter positions as described without pneumothorax. Congestive heart failure. New small left effusion compared to earlier in the day. The degree of edema and right effusion remain stable.   Electronically Signed   By: Lowella Grip III M.D.   On: 05/22/2015 08:32   Dg Chest Port 1 View  05/22/2015   CLINICAL DATA:  Decreased oxygen saturation and shortness of breath  EXAM: PORTABLE CHEST - 1 VIEW  COMPARISON:  May 21, 2015  FINDINGS: Central catheter tip is in the superior cava. No pneumothorax. There is increase in interstitial edema. There is a new right pleural effusion. Heart is mildly enlarged with mild pulmonary venous hypertension. There is atelectatic change superimposed on edema in the left base. No adenopathy.  IMPRESSION: Congestive heart failure with new right effusion increased edema compared to 1 day prior.   Electronically Signed   By: Lowella Grip III M.D.   On: 05/22/2015 07:26   Dg Chest Port 1 View  05/21/2015   CLINICAL DATA:  Followup acute renal failure  EXAM: PORTABLE CHEST - 1 VIEW  COMPARISON:  05/20/2015  FINDINGS: Right IJ central line, tip at the SVC level.  No pneumothorax.  New diffuse interstitial opacity, compatible with edema. Normal heart size and stable aortic tortuosity, accentuated by rotation. Hyperinflation which is stable from previous. No emphysematous changes on 04/25/2014 chest CT.  IMPRESSION: 1. New  right IJ central line.  No pneumothorax. 2. New mild pulmonary edema.   Electronically Signed   By: Monte Fantasia M.D.   On: 05/21/2015 15:42   Dg Chest Portable 1 View  05/20/2015   CLINICAL DATA:  Chest pain, hypotension, syncope  EXAM: PORTABLE CHEST - 1 VIEW  COMPARISON:  04/04/2014  FINDINGS: The heart size and mediastinal contours are within normal limits. Both lungs are clear. The visualized skeletal structures are unremarkable.  IMPRESSION: No active disease.   Electronically Signed   By: Lucienne Capers M.D.   On: 05/20/2015 23:35   Dg Abd Portable 1v  05/22/2015   CLINICAL DATA:  Nasogastric tube placement  EXAM: PORTABLE ABDOMEN - 1 VIEW  COMPARISON:  CT abdomen and pelvis May 21, 2015  FINDINGS: Nasogastric tube tip and side port are within the stomach. There is no bowel dilatation or air-fluid level suggesting obstruction. No free air is seen on this supine examination. There are phleboliths in the pelvis.  IMPRESSION: Nasogastric tube tip and side port in stomach. Bowel gas pattern unremarkable.   Electronically Signed   By: Lowella Grip III M.D.   On: 05/22/2015 08:33   Dg Abd Portable 1v  05/21/2015   CLINICAL DATA:  Chest and abdominal pain. Shortness of breath. Hypotension.  EXAM: PORTABLE ABDOMEN - 1 VIEW  COMPARISON:  01/07/2011  FINDINGS: The right abdomen and low pelvis  are not included within the field of view. Superimposed structures obscure visualization of some of the left upper quadrant. Visualized bowel gas pattern is normal without significant colonic or small bowel distention. No radiopaque stones. Degenerative changes in the spine.  IMPRESSION: Limited study.  No evidence of bowel obstruction.   Electronically Signed   By: Lucienne Capers M.D.   On: 05/21/2015 00:00    Oren Binet, MD  [facility] Pager:[phone]  If 7PM-7AM, please contact night-coverage [email or web address] Password [email or web address] 06/08/2015, 11:05 AM   LOS: 3 days

## 2015-06-08 NOTE — Progress Notes (Signed)
  DAILY PROGRESS NOTE  Subjective:  Converted to sinus rhythm on amiodarone and low dose b-blocker. No evidence for significant bradycardia overnight. INR is trending down, now 2.23 (was supratherapeutic on admission). Creatinine is improving, now down to 1.18.  Objective:  Temp:  [98 F (36.7 C)-98.8 F (37.1 C)] 98 F (36.7 C) (07/02 0621) Pulse Rate:  [66-116] 78 (07/02 0621) Resp:  [18] 18 (07/02 0621) BP: (151-180)/(78-89) 155/89 mmHg (07/02 0621) SpO2:  [97 %-100 %] 100 % (07/02 0621) Weight:  [131 lb 15.1 oz (59.848 kg)] 131 lb 15.1 oz (59.848 kg) (07/02 0621) Weight change:   Intake/Output from previous day: 07/01 0701 - 07/02 0700 In: 706.8 [P.O.:600; I.V.:106.8] Out: 1070 [Urine:1066; Stool:4]  Intake/Output from this shift: Total I/O In: 480 [P.O.:480] Out: 501 [Urine:500; Stool:1]  Medications: Current Facility-Administered Medications  Medication Dose Route Frequency Provider Last Rate Last Dose  . 0.9 %  sodium chloride infusion  250 mL Intravenous PRN Douglas B McQuaid, MD 10 mL/hr at 06/07/15 0900 250 mL at 06/07/15 0900  . 0.9 %  sodium chloride infusion   Intravenous Continuous David B Simonds, MD      . acetaminophen (TYLENOL) tablet 650 mg  650 mg Oral Q4H PRN Douglas B McQuaid, MD   650 mg at 06/07/15 2208  . amiodarone (PACERONE) tablet 400 mg  400 mg Oral BID David B Simonds, MD   400 mg at 06/08/15 1019  . calcium gluconate 1 g in sodium chloride 0.9 % 100 mL IVPB  1 g Intravenous Once Courtney F Horton, MD   1 g at 06/05/15 0547  . feeding supplement (ENSURE ENLIVE) (ENSURE ENLIVE) liquid 237 mL  237 mL Oral BID BM Heather C Pitts, RD   237 mL at 06/08/15 1000  . insulin aspart (novoLOG) injection 0-9 Units  0-9 Units Subcutaneous TID WC Douglas B McQuaid, MD   0 Units at 06/08/15 0700  . levothyroxine (SYNTHROID, LEVOTHROID) tablet 50 mcg  50 mcg Oral QAC breakfast David B Simonds, MD   50 mcg at 06/08/15 0819  . metoprolol tartrate (LOPRESSOR)  tablet 25 mg  25 mg Oral BID Hao Meng, PA   25 mg at 06/08/15 1019  . ondansetron (ZOFRAN) injection 4 mg  4 mg Intravenous Q6H PRN Douglas B McQuaid, MD      . Warfarin - Pharmacist Dosing Inpatient   Does not apply q1800 Nathan J Batchelder, RPH   0  at 06/07/15 1800    Physical Exam: General appearance: alert and no distress Neck: no JVD and supple, symmetrical, trachea midline Lungs: clear to auscultation bilaterally Heart: regular rate and rhythm Abdomen: soft, non-tender; bowel sounds normal; no masses,  no organomegaly Extremities: left hand/forearm, swollen and ecchymotic Pulses: 2+ and symmetric Skin: left hand ecchymosis Neurologic: Grossly normal Psych: pleasant  Lab Results: Results for orders placed or performed during the hospital encounter of 06/05/15 (from the past 48 hour(s))  Glucose, capillary     Status: Abnormal   Collection Time: 06/06/15  3:51 PM  Result Value Ref Range   Glucose-Capillary 119 (H) 65 - 99 mg/dL   Comment 1 Capillary Specimen    Comment 2 Notify RN   Glucose, capillary     Status: Abnormal   Collection Time: 06/06/15  7:10 PM  Result Value Ref Range   Glucose-Capillary 247 (H) 65 - 99 mg/dL   Comment 1 Capillary Specimen    Comment 2 Notify RN    Comment 3 Document in Chart     Glucose, capillary     Status: Abnormal   Collection Time: 06/06/15  9:59 PM  Result Value Ref Range   Glucose-Capillary 157 (H) 65 - 99 mg/dL   Comment 1 Capillary Specimen    Comment 2 Notify RN    Comment 3 Document in Chart   Procalcitonin     Status: None   Collection Time: 06/07/15  2:35 AM  Result Value Ref Range   Procalcitonin 0.85 ng/mL    Comment:        Interpretation: PCT > 0.5 ng/mL and <= 2 ng/mL: Systemic infection (sepsis) is possible, but other conditions are known to elevate PCT as well. (NOTE)         ICU PCT Algorithm               Non ICU PCT Algorithm    ----------------------------     ------------------------------         PCT <  0.25 ng/mL                 PCT < 0.1 ng/mL     Stopping of antibiotics            Stopping of antibiotics       strongly encouraged.               strongly encouraged.    ----------------------------     ------------------------------       PCT level decrease by               PCT < 0.25 ng/mL       >= 80% from peak PCT       OR PCT 0.25 - 0.5 ng/mL          Stopping of antibiotics                                             encouraged.     Stopping of antibiotics           encouraged.    ----------------------------     ------------------------------       PCT level decrease by              PCT >= 0.25 ng/mL       < 80% from peak PCT        AND PCT >= 0.5 ng/mL             Continuing antibiotics                                              encouraged.       Continuing antibiotics            encouraged.    ----------------------------     ------------------------------     PCT level increase compared          PCT > 0.5 ng/mL         with peak PCT AND          PCT >= 0.5 ng/mL             Escalation of antibiotics                                            strongly encouraged.      Escalation of antibiotics        strongly encouraged.   Comprehensive metabolic panel     Status: Abnormal   Collection Time: 06/07/15  2:35 AM  Result Value Ref Range   Sodium 138 135 - 145 mmol/L   Potassium 3.6 3.5 - 5.1 mmol/L   Chloride 106 101 - 111 mmol/L   CO2 23 22 - 32 mmol/L   Glucose, Bld 142 (H) 65 - 99 mg/dL   BUN 13 6 - 20 mg/dL   Creatinine, Ser 1.47 (H) 0.44 - 1.00 mg/dL   Calcium 8.3 (L) 8.9 - 10.3 mg/dL   Total Protein 5.3 (L) 6.5 - 8.1 g/dL   Albumin 2.6 (L) 3.5 - 5.0 g/dL   AST 253 (H) 15 - 41 U/L   ALT 397 (H) 14 - 54 U/L   Alkaline Phosphatase 261 (H) 38 - 126 U/L   Total Bilirubin 0.3 0.3 - 1.2 mg/dL   GFR calc non Af Amer 31 (L) >60 mL/min   GFR calc Af Amer 36 (L) >60 mL/min    Comment: (NOTE) The eGFR has been calculated using the CKD EPI equation. This calculation has  not been validated in all clinical situations. eGFR's persistently <60 mL/min signify possible Chronic Kidney Disease.    Anion gap 9 5 - 15  CBC     Status: Abnormal   Collection Time: 06/07/15  2:35 AM  Result Value Ref Range   WBC 10.3 4.0 - 10.5 K/uL   RBC 2.84 (L) 3.87 - 5.11 MIL/uL   Hemoglobin 9.3 (L) 12.0 - 15.0 g/dL   HCT 27.6 (L) 36.0 - 46.0 %   MCV 97.2 78.0 - 100.0 fL   MCH 32.7 26.0 - 34.0 pg   MCHC 33.7 30.0 - 36.0 g/dL   RDW 15.7 (H) 11.5 - 15.5 %   Platelets 194 150 - 400 K/uL  Glucose, capillary     Status: Abnormal   Collection Time: 06/07/15  8:44 AM  Result Value Ref Range   Glucose-Capillary 121 (H) 65 - 99 mg/dL   Comment 1 Notify RN   Protime-INR     Status: Abnormal   Collection Time: 06/07/15 10:00 AM  Result Value Ref Range   Prothrombin Time 27.9 (H) 11.6 - 15.2 seconds   INR 2.65 (H) 0.00 - 1.49  Glucose, capillary     Status: Abnormal   Collection Time: 06/07/15 12:51 PM  Result Value Ref Range   Glucose-Capillary 182 (H) 65 - 99 mg/dL   Comment 1 Notify RN   Glucose, capillary     Status: Abnormal   Collection Time: 06/07/15  4:14 PM  Result Value Ref Range   Glucose-Capillary 106 (H) 65 - 99 mg/dL   Comment 1 Notify RN   Glucose, capillary     Status: Abnormal   Collection Time: 06/07/15  9:43 PM  Result Value Ref Range   Glucose-Capillary 139 (H) 65 - 99 mg/dL  Basic metabolic panel     Status: Abnormal   Collection Time: 06/08/15  3:13 AM  Result Value Ref Range   Sodium 138 135 - 145 mmol/L   Potassium 3.8 3.5 - 5.1 mmol/L   Chloride 105 101 - 111 mmol/L   CO2 24 22 - 32 mmol/L   Glucose, Bld 131 (H) 65 - 99 mg/dL   BUN 14 6 - 20 mg/dL   Creatinine, Ser 1.18 (H) 0.44 - 1.00 mg/dL   Calcium 8.7 (L)  8.9 - 10.3 mg/dL   GFR calc non Af Amer 40 (L) >60 mL/min   GFR calc Af Amer 47 (L) >60 mL/min    Comment: (NOTE) The eGFR has been calculated using the CKD EPI equation. This calculation has not been validated in all clinical  situations. eGFR's persistently <60 mL/min signify possible Chronic Kidney Disease.    Anion gap 9 5 - 15  CBC     Status: Abnormal   Collection Time: 06/08/15  3:13 AM  Result Value Ref Range   WBC 8.1 4.0 - 10.5 K/uL   RBC 3.08 (L) 3.87 - 5.11 MIL/uL   Hemoglobin 10.0 (L) 12.0 - 15.0 g/dL   HCT 29.9 (L) 36.0 - 46.0 %   MCV 97.1 78.0 - 100.0 fL   MCH 32.5 26.0 - 34.0 pg   MCHC 33.4 30.0 - 36.0 g/dL   RDW 15.6 (H) 11.5 - 15.5 %   Platelets 219 150 - 400 K/uL  Protime-INR     Status: Abnormal   Collection Time: 06/08/15  3:13 AM  Result Value Ref Range   Prothrombin Time 24.5 (H) 11.6 - 15.2 seconds   INR 2.23 (H) 0.00 - 1.49  Glucose, capillary     Status: Abnormal   Collection Time: 06/08/15  6:25 AM  Result Value Ref Range   Glucose-Capillary 116 (H) 65 - 99 mg/dL  Glucose, capillary     Status: Abnormal   Collection Time: 06/08/15 11:12 AM  Result Value Ref Range   Glucose-Capillary 163 (H) 65 - 99 mg/dL   Comment 1 Notify RN     Imaging: Dg Chest Port 1 View  06/07/2015   CLINICAL DATA:  Pleural effusions.  Atrial fibrillation.  EXAM: PORTABLE CHEST - 1 VIEW  COMPARISON:  June 06, 2015  FINDINGS: Endotracheal tube and nasogastric tube have been removed. No pneumothorax. There is a layering right pleural effusion. There is a smaller left effusion. No airspace consolidation is appreciable. Heart is upper normal in size with pulmonary vascularity within normal limits. No adenopathy.  IMPRESSION: Bilateral pleural effusions, right larger than left. These effusions do not appear significantly changed compared to 1 day prior. There is no pneumothorax. No edema or consolidation appreciable.   Electronically Signed   By: William  Woodruff III M.D.   On: 06/07/2015 07:48    Assessment:  Active Problems:   OSA (obstructive sleep apnea)   Atrial fibrillation   HTN (hypertension)   DM (diabetes mellitus)   Long term current use of anticoagulant therapy    Bradycardia   Plan:  1. Plan for amiodarone loading as outlined in Dr. McAlhany's consult note. Continue low dose b-blocker. No evidence for recurrent junctional bradycardia. INR is therapeutic at this time. Continue warfarin for CHADSVASC score of 6, will be followed at [facility] office after discharge.  No further cardiac recommendations at this time. Follow-up with Dr. Berry of midlevel provider after discharge in 5-7 days and our warfarin clinic within 1 week. Cardiology will sign-off. Call with questions.   Time Spent Directly with Patient:  15 minutes  Length of Stay:  LOS: 3 days    C. , MD, FACC Attending Cardiologist [facility]   C  06/08/2015, 11:55 AM     

## 2015-06-08 NOTE — Progress Notes (Signed)
[place] for Coumadin Indication: atrial fibrillation  Allergies  Allergen Reactions  . Ace Inhibitors     Severe Hyperkalemia  . Aldactone [Spironolactone]     Severe hyperkalemia causing junctional bradycardia    Patient Measurements: Height: 5\' 7"  (170.2 cm) Weight: 131 lb 15.1 oz (59.848 kg) (Scale A) IBW/kg (Calculated) : 61.6  Vital Signs: Temp: 98 F (36.7 C) (07/02 0621) Temp Source: Oral (07/02 0621) BP: 155/89 mmHg (07/02 0621) Pulse Rate: 78 (07/02 0621)  Labs:  Recent Labs  06/05/15 2024  06/06/15 0247 06/07/15 0235 06/07/15 1000 06/08/15 0313  HGB  --   < > 9.6* 9.3*  --  10.0*  HCT  --   --  27.8* 27.6*  --  29.9*  PLT  --   --  197 194  --  219  APTT  --   --  37  --   --   --   LABPROT  --   --  35.6*  --  27.9* 24.5*  INR  --   --  3.67*  --  2.65* 2.23*  CREATININE  --   --  1.74* 1.47*  --  1.18*  TROPONINI <0.03  --   --   --   --   --   < > = values in this interval not displayed.  Estimated Creatinine Clearance: 31.7 mL/min (by C-G formula based on Cr of 1.18).  Assessment: 81 yo F presents on 6/29 with acute encephalopathy after just being discharged for renal failure while being treated with mechanical ventilation and HD. On chronic coumadin for Afib.  INR elevated to 4.17 on 6/29, now has drifted down to 2.23 after holding a couple of doses. Restarted last night and INR trend down appears to be slowing. Also of note patient started on amiodarone which can increase warfarin sensitivity. Will need to be cautious with dosing at discharge and have close follow up in [facility] clinic.  PTA Coumadin is 5mg  daily exc 7.5mg  on MWF (Last [facility] clinic visit was 6/27 with INR of 2.2)  Goal of Therapy:  INR 2-3 Monitor platelets by anticoagulation protocol: Yes   Plan:  Give coumadin 5mg  PO x 1 Monitor daily INR, CBC, s/s of bleed  Erin Hearing PharmD., BCPS Clinical Pharmacist Pager [phone] 06/08/2015 1:09  PM

## 2015-06-09 DIAGNOSIS — I480 Paroxysmal atrial fibrillation: Secondary | ICD-10-CM

## 2015-06-09 DIAGNOSIS — I10 Essential (primary) hypertension: Secondary | ICD-10-CM

## 2015-06-09 LAB — COMPREHENSIVE METABOLIC PANEL
ALK PHOS: 252 U/L — AB (ref 38–126)
ALT: 207 U/L — ABNORMAL HIGH (ref 14–54)
AST: 50 U/L — ABNORMAL HIGH (ref 15–41)
Albumin: 2.8 g/dL — ABNORMAL LOW (ref 3.5–5.0)
Anion gap: 12 (ref 5–15)
BILIRUBIN TOTAL: 0.9 mg/dL (ref 0.3–1.2)
BUN: 11 mg/dL (ref 6–20)
CO2: 23 mmol/L (ref 22–32)
CREATININE: 1.18 mg/dL — AB (ref 0.44–1.00)
Calcium: 8.8 mg/dL — ABNORMAL LOW (ref 8.9–10.3)
Chloride: 104 mmol/L (ref 101–111)
GFR calc Af Amer: 47 mL/min — ABNORMAL LOW (ref >60)
GFR, EST NON AFRICAN AMERICAN: 40 mL/min — AB (ref >60)
Glucose, Bld: 115 mg/dL — ABNORMAL HIGH (ref 65–99)
POTASSIUM: 3.4 mmol/L — AB (ref 3.5–5.1)
Sodium: 139 mmol/L (ref 135–145)
Total Protein: 5.7 g/dL — ABNORMAL LOW (ref 6.5–8.1)

## 2015-06-09 LAB — GLUCOSE, CAPILLARY
GLUCOSE-CAPILLARY: 224 mg/dL — AB (ref 65–99)
Glucose-Capillary: 116 mg/dL — ABNORMAL HIGH (ref 65–99)

## 2015-06-09 LAB — PROTIME-INR
INR: 2.21 — ABNORMAL HIGH (ref 0.00–1.49)
Prothrombin Time: 24.4 seconds — ABNORMAL HIGH (ref 11.6–15.2)

## 2015-06-09 MED ORDER — AMIODARONE HCL 400 MG PO TABS: 400.0000 mg | ORAL_TABLET | Freq: Two times a day (BID) | ORAL | Status: DC

## 2015-06-09 MED ORDER — ENSURE ENLIVE PO LIQD: 237.0000 mL | Freq: Two times a day (BID) | ORAL | Status: AC

## 2015-06-09 MED ORDER — AMLODIPINE BESYLATE 5 MG PO TABS: 5.0000 mg | ORAL_TABLET | Freq: Every day | ORAL | Status: DC

## 2015-06-09 MED ORDER — WARFARIN SODIUM 5 MG PO TABS
5.0000 mg | ORAL_TABLET | Freq: Once | ORAL | Status: DC
  Filled 2015-06-09: qty 1

## 2015-06-09 MED ORDER — WARFARIN SODIUM 5 MG PO TABS: ORAL_TABLET | ORAL | Status: AC

## 2015-06-09 MED ORDER — AMLODIPINE BESYLATE 5 MG PO TABS
5.0000 mg | ORAL_TABLET | Freq: Every day | ORAL | Status: DC
  Administered 2015-06-09: 5 mg via ORAL
  Filled 2015-06-09: qty 1

## 2015-06-09 MED ORDER — POTASSIUM CHLORIDE CRYS ER 20 MEQ PO TBCR
40.0000 meq | EXTENDED_RELEASE_TABLET | Freq: Once | ORAL | Status: AC
  Administered 2015-06-09: 40 meq via ORAL
  Filled 2015-06-09: qty 2

## 2015-06-09 NOTE — Care Management Note (Signed)
Case Management Note  Patient Details  Name: PRISCILLA KIRSTEIN MRN: [id number] Date of Birth: 01-23-1925  Subjective/Objective:                   Acute renal failure Action/Plan:  Discharge planning Expected Discharge Date:  06/09/15               Expected Discharge Plan:  [place]  In-House Referral:     Discharge planning Services  CM Consult  Post Acute Care Choice:    Choice offered to:  Patient  DME Arranged:    DME Agency:     HH Arranged:  RN, PT, OT, Nurse's Aide [place] Agency:  Other - See comment  Status of Service:  Completed, signed off  Medicare Important Message Given:    Date Medicare IM Given:    Medicare IM give by:    Date Additional Medicare IM Given:    Additional Medicare Important Message give by:     If discussed at [place] of Stay Meetings, dates discussed:    Additional Comments: CM met with pt to offer choice and pt chooses [facility].  Address and contact information verified with pt.  Referral called to [facility] for HHPT/OT/RN/aide/SW.  No other Cm needs were communicated. Dellie Catholic, RN 06/09/2015, 8:45 AM

## 2015-06-09 NOTE — Progress Notes (Signed)
Patient is discharge to home accompanied by family members and NT via wheelchair. Patient' stable no signs of distress noted. Vital signs taken and recorded. Discharge instructions and prescription given patient and family members (son and daughter-in law) verbalizes understanding. All personal belongings given. Charge nurse notified.

## 2015-06-09 NOTE — Discharge Summary (Addendum)
PATIENT DETAILS Name: [name] Age: 81 y.o. Sex: female Date of Birth: 01-15-1925 MRN: [id number]. Admitting Physician: Juanito Doom, MD [id number], Viviann Spare, MD  Admit Date: 06/05/2015 Discharge date: 06/09/2015  Recommendations for Outpatient Follow-up:  1. Avoid ACEI/ARB's and Aldactone in the future 2. Please check CBC, chemistries including LFTs at next visit. 3. A1c at 6.4-metformin discontinued (severe acidosis on admission)-monitor off oral hypoglycemic agents  PRIMARY DISCHARGE DIAGNOSIS:  Active Problems:   Bradycardia   Hyperkalemia   Metabolic acidosis     OSA (obstructive sleep apnea)   Atrial fibrillation   HTN (hypertension)   DM (diabetes mellitus)   Long term current use of anticoagulant therapy      PAST MEDICAL HISTORY: Past Medical History  Diagnosis Date  . OSA (obstructive sleep apnea)     AHI-44/hr, AHI during REM 61.8/hr, avg O2 during REM and NREM 96%  . Atrial fibrillation   . Myocardial infarct   . Diabetes mellitus   . Hearing loss   . Hypertension   . Hyperlipidemia   . Senile degeneration of brain   . Other generalized ischemic cerebrovascular disease   . Personal history of fall   . Pain in joint, shoulder region   . 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 Loss of weight   . Chronic kidney disease, stage II (mild)   . Obstructive sleep apnea (adult) (pediatric)   . Senile degeneration of brain   . Dizziness and giddiness   . Personal history of noncompliance with medical treatment, presenting hazards to health   . Dyskinesia of esophagus   . Shortness of breath   . Chest pain, unspecified 06/19/2009  . Long term (current) use of anticoagulants   . Unspecified hereditary and idiopathic peripheral neuropathy   . Pain in joint, site unspecified   . Carpal tunnel syndrome   . Other hammer toe (acquired)   . Alopecia areata   . Dysphagia, unspecified(787.20)   . Occlusion and stenosis of carotid  artery without mention of cerebral infarction   . Other symptoms involving cardiovascular system   . Other acne   . Allergic rhinitis, cause unspecified   . Type II or unspecified type diabetes mellitus without mention of complication, uncontrolled   . Varicose veins of lower extremities   . Cervicalgia   . Disturbance of salivary secretion   . Insomnia, unspecified   . Unspecified hypothyroidism   . Palpitations 715.90  . Other malaise and fatigue   . Diverticulosis of colon (without mention of hemorrhage)   . Benign neoplasm of colon   . Depressive disorder, not elsewhere classified   . Syncope and collapse   . Paroxysmal atrial fibrillation   . Hypertension   . Dizziness     DISCHARGE MEDICATIONS: Current Discharge Medication List    START taking these medications   Details  amiodarone (PACERONE) 400 MG tablet Take 1 tablet (400 mg total) by mouth 2 (two) times daily. Take 400 mg BID till 06/13/15, and then, Take 200 mg BID from 06/14/15 for 1 month,and then Take 200 mg daily and stay on it Qty: 240 tablet, Refills: 0    amLODipine (NORVASC) 5 MG tablet Take 1 tablet (5 mg total) by mouth daily. Qty: 30 tablet, Refills: 0    feeding supplement, ENSURE ENLIVE, (ENSURE ENLIVE) LIQD Take 237 mLs by mouth 2 (two) times daily between meals. Qty: 60 Bottle, Refills: 0      CONTINUE these medications which  have NOT CHANGED   Details  calcium carbonate (TUMS - DOSED IN MG ELEMENTAL CALCIUM) 500 MG chewable tablet Chew 2 tablets by mouth daily as needed for indigestion or heartburn.    estradiol (ESTRACE) 0.5 MG tablet TAKE 1 TABLET EVERY DAY FOR HORMONES Qty: 90 tablet, Refills: 3    levothyroxine (SYNTHROID, LEVOTHROID) 75 MCG tablet Take 1 tablet (75 mcg total) by mouth daily before breakfast. Qty: 30 tablet, Refills: 5    Vitamins A & D (SWEEN 24) CREA Apply 1 application topically daily.    !! warfarin (COUMADIN) 5 MG tablet Take 1  tablets by mouth daily as directed by  coumadin clinic Qty: 40 tablet, Refills: 3    hydroxypropyl methylcellulose (ISOPTO TEARS) 2.5 % ophthalmic solution Place 1 drop into both eyes as needed for dry eyes.    !! warfarin (COUMADIN) 7.5 MG tablet Take 7.5 mg by mouth daily at 6 PM. Mon, Wed, Fri Refills: 0     !! - Potential duplicate medications found. Please discuss with provider.    STOP taking these medications     diltiazem (CARDIZEM CD) 180 MG 24 hr capsule      lisinopril (PRINIVIL,ZESTRIL) 5 MG tablet      metFORMIN (GLUCOPHAGE) 500 MG tablet      metoprolol (LOPRESSOR) 50 MG tablet      miconazole (MICOTIN) 2 % powder      PRESCRIPTION MEDICATION      spironolactone (ALDACTONE) 25 MG tablet         ALLERGIES:   Allergies  Allergen Reactions  . Ace Inhibitors     Severe Hyperkalemia  . Aldactone [Spironolactone]     Severe hyperkalemia causing junctional bradycardia    BRIEF HPI:  See H&P, Labs, Consult and Test reports for all details in brief, 81 y/o female with a history of atrial fib and a recent hospitalization for AKI was admitted on 6/29 from the [facility] ED with junctional bradycardia, acute encephalopathy due to hyperkalemia/acidosis.Patient was intubated on admission and admitted to the intensive care unit.   CONSULTATIONS:   cardiology and pulmonary/intensive care  PERTINENT RADIOLOGIC STUDIES: Ct Abdomen Pelvis Wo Contrast  06/05/2015   CLINICAL DATA:  Initial encounter for sepsis of uncertain etiology with acidosis and abdominal pain.  EXAM: CT ABDOMEN AND PELVIS WITHOUT CONTRAST  TECHNIQUE: Multidetector CT imaging of the abdomen and pelvis was performed following the standard protocol without IV contrast.  COMPARISON:  05/21/2015.  FINDINGS: Lower chest: Bibasilar collapse/ consolidation with small to moderate right pleural effusion and small left pleural effusion.  Hepatobiliary: No focal abnormality is seen in the liver on this uninfused exam. Gallbladder wall appears thickened. Multiple  gallstones are evident, measuring up to 13 mm in diameter. No substantial intra or extrahepatic biliary duct dilatation.  Pancreas: Poorly discriminated given the lack of intravenous contrast and body edema/ascites. No dilatation of the main duct is evident.  Spleen: Unremarkable.  Adrenals/Urinary Tract: Thickening of both adrenal glands is noted. There is no hydronephrosis in either kidney. No evidence for hydroureter current Foley catheter decompresses the urinary bladder.  Stomach/Bowel: NG tube tip is tenting the greater curvature of the stomach. Stomach is decompressed. Duodenum is nondistended. No evidence for small bowel obstruction. Therein is may fluid filled slightly distended small bowel loop in the central pelvis (see image 67 series 2) measuring 3 cm in diameter. There is some interloop mesenteric fluid associated with this loop and small bowel feces sign is evident. There is diverticular change in  the colon without evidence for overt diverticulitis.  Vascular/Lymphatic: There is abdominal aortic atherosclerosis without aneurysm. No gross lymphadenopathy is seen in the abdomen or pelvis.  Reproductive: Uterus is surgically absent. No evidence for adnexal mass.  Other: Small volume intraperitoneal free fluid is evident.  Musculoskeletal: Diffuse body wall edema noted. Bone windows reveal no worrisome lytic or sclerotic osseous lesions.  IMPRESSION: There is a loop of mildly distended fluid filled small bowel in the central pelvis with associated small bowel feces sign. Interloop mesenteric fluid is associated. Given the configuration, closed loop obstruction is a consideration although this is difficult to confirm without oral or IV contrast. Distention from early mechanical small bowel obstruction would also be a consideration. Repeat CT imaging after administration of oral contrast may prove helpful to determine whether or not this loop opacifies.  Slight increase in pleural effusions.  Marked  gallbladder wall thickening with gallstones evident. If there is clinical concern for cholecystitis, nuclear scintigraphy may prove helpful to further evaluate.   Electronically Signed   By: Misty Stanley M.D.   On: 06/05/2015 08:18   Ct Abdomen Pelvis Wo Contrast  05/21/2015   CLINICAL DATA:  Sepsis.  GI bleed.  Diarrhea.  Renal failure.  EXAM: CT ABDOMEN AND PELVIS WITHOUT CONTRAST  TECHNIQUE: Multidetector CT imaging of the abdomen and pelvis was performed following the standard protocol without IV contrast.  COMPARISON:  12/15/2003  FINDINGS: There is a moderate right pleural effusion and a small left pleural effusion. There is a small amount of ascites. Hepatic veins are somewhat distended as is the inferior vena cava consistent with elevated right heart pressure.  No focal liver lesions. There are multiple stones in the gallbladder and the gallbladder is quite distended. No dilated bile ducts. Spleen, pancreas, and adrenal glands are normal. There is a single tiny stone in the mid right kidney and there are several tiny stones in the left kidney. No hydronephrosis. There is ascites peripheral to Gerota's fascia bilaterally. There is a tiny hiatal hernia. There are few diverticula in the distal colon but there is no evidence of diverticulitis. The mucosa of the distal sigmoid and rectum appears slightly prominent which could represent proctitis. The mucosa of the remainder of the colon appears normal. Terminal ileum and small bowel are normal.  Extensive calcification in the abdominal aorta and iliac arteries. Foley catheter in the bladder. Previous removal of the uterus and ovaries. No acute osseous abnormality.  IMPRESSION: 1. Bilateral pleural effusions, moderate on the right and small on the left. 2. Ascites. 3. Distended gallbladder with multiple gallstones. 4. Tiny bilateral renal stones. 5. Slight prominence of the mucosa in the rectum and distal sigmoid. This could represent proctitis.    Electronically Signed   By: Lorriane Shire M.D.   On: 05/21/2015 10:33   Dg Chest 2 View  05/28/2015   CLINICAL DATA:  Shortness of Breath  EXAM: CHEST  2 VIEW  COMPARISON:  May 25, 2015  FINDINGS: Central catheter has been removed. No pneumothorax. There is stable airspace consolidation in the left lower lobe. There are small pleural effusions bilaterally. There is patchy atelectasis in the right upper lobe, stable. Heart is upper normal in size with pulmonary vascularity within normal limits. No adenopathy. No bone lesions.  IMPRESSION: Persistent left lower lobe consolidation. Small bilateral effusions. Stable atelectasis right upper lobe. No change in cardiac silhouette.   Electronically Signed   By: Lowella Grip III M.D.   On: 05/28/2015 08:57  Dg Abd 1 View  06/06/2015   CLINICAL DATA:  Abdominal distention  EXAM: ABDOMEN - 1 VIEW  COMPARISON:  CT 06/05/2015  FINDINGS: The bowel gas pattern is normal. No radio-opaque calculi or other significant radiographic abnormality are seen.  IMPRESSION: Negative.   Electronically Signed   By: Conchita Paris M.D.   On: 06/06/2015 10:27   Ct Head Wo Contrast  05/21/2015   CLINICAL DATA:  Altered mental status  EXAM: CT HEAD WITHOUT CONTRAST  TECHNIQUE: Contiguous axial images were obtained from the base of the skull through the vertex without intravenous contrast.  COMPARISON:  10/13/2012  FINDINGS: Insert motion images were repeated. Mild cortical volume loss noted with proportional ventricular prominence. Areas of periventricular white matter hypodensity are most compatible with small vessel ischemic change. No acute hemorrhage, infarct, or mass lesion is identified. Orbits and paranasal sinuses are unremarkable. No skull fracture.  IMPRESSION: No acute intracranial abnormality. Stable degree of cortical volume loss.   Electronically Signed   By: Conchita Paris M.D.   On: 05/21/2015 10:01   US Abdomen Complete  05/21/2015   CLINICAL DATA:  Abdominal  pain.  Gallstones.  EXAM: ULTRASOUND ABDOMEN COMPLETE  COMPARISON:  CT abdomen and pelvis earlier this same day.  FINDINGS: Gallbladder: Stones are identified within the gallbladder measuring up to 1.5 cm. The gallbladder wall is thickened at 0.7 cm. Sonographer reports negative Murphy's sign.  Common bile duct: Diameter: 0.3 cm.  Liver: No focal lesion identified. Within normal limits in parenchymal echogenicity.  IVC: No abnormality visualized.  Pancreas: Visualized portion unremarkable.  Spleen: Size and appearance within normal limits.  Right Kidney: Length: 10.9 cm. Echogenicity within normal limits. No hydronephrosis visualized. A 0.8 cm hyperechoic lesion in the lower pole of the left kidney cannot be definitively characterized but likely represents a complex cyst.  Left Kidney: Length: 10.9 cm. Echogenicity within normal limits. No mass or hydronephrosis visualized.  Abdominal aorta: No aneurysm visualized.  Other findings: There is a small volume of abdominal ascites. Bilateral pleural effusions are seen, greater on the right.  IMPRESSION: Gallstones without evidence of cholecystitis. Gallbladder wall thickening is likely related to small volume of abdominal ascites.  Bilateral pleural effusions.   Electronically Signed   By: Inge Rise M.D.   On: 05/21/2015 14:04   Dg Chest Port 1 View  06/07/2015   CLINICAL DATA:  Pleural effusions.  Atrial fibrillation.  EXAM: PORTABLE CHEST - 1 VIEW  COMPARISON:  June 06, 2015  FINDINGS: Endotracheal tube and nasogastric tube have been removed. No pneumothorax. There is a layering right pleural effusion. There is a smaller left effusion. No airspace consolidation is appreciable. Heart is upper normal in size with pulmonary vascularity within normal limits. No adenopathy.  IMPRESSION: Bilateral pleural effusions, right larger than left. These effusions do not appear significantly changed compared to 1 day prior. There is no pneumothorax. No edema or consolidation  appreciable.   Electronically Signed   By: Lowella Grip III M.D.   On: 06/07/2015 07:48   Dg Chest Port 1 View  06/06/2015   CLINICAL DATA:  Follow-up of respiratory failure  EXAM: PORTABLE CHEST - 1 VIEW  COMPARISON:  Portable chest x-ray of June 05, 2015  FINDINGS: The lungs are well-expanded. The hemidiaphragms and costophrenic angles are excluded from the field of view however. There is increased density at the right lung base likely reflecting pleural fluid layering posteriorly. There is no alveolar infiltrate. The cardiac silhouette is top-normal in size.  The pulmonary vascularity is not engorged. The mediastinum is normal in width. The endotracheal tube tip lies 3.8 cm above the carina. The esophagogastric tube tip projects below the inferior margin of the image.  IMPRESSION: COPD and probable right pleural effusion. No alveolar pneumonia is demonstrated. The support tubes are in reasonable position.   Electronically Signed   By: David  Martinique M.D.   On: 06/06/2015 07:25   Dg Chest Port 1 View  06/05/2015   CLINICAL DATA:  Dyspnea.  Intubation.  EXAM: PORTABLE CHEST - 1 VIEW  COMPARISON:  06/05/2015 at 04:49  FINDINGS: The endotracheal tube is 4.5 cm above the carina. The nasogastric tube extends into the stomach. There is no interval change in the hazy opacities bilaterally, consistent with alveolar edema and/or effusions. There is no pneumothorax.  IMPRESSION: Support equipment appears satisfactorily positioned.  No significant interval change in the bilateral airspace opacities.   Electronically Signed   By: Andreas Newport M.D.   On: 06/05/2015 06:00   Dg Chest Portable 1 View  06/05/2015   CLINICAL DATA:  Bradycardia and hypotension. History of diabetes, hypertension, myocardial infarction, dementia.  EXAM: PORTABLE CHEST - 1 VIEW  COMPARISON:  Chest radiograph May 28, 2015  FINDINGS: The cardiac silhouette appears mildly enlarged, similar. Tortuous calcified aorta. Hazy densities in  lung bases favor layering pleural effusion. Similar pulmonary vascular congestion mild interstitial prominence. No pneumothorax. Pacer pad overlies the central and lower chest. Multiple EKG lines overlie the patient and may obscure subtle underlying pathology. Soft tissue planes and included osseous structures are nonsuspicious. Mild vascular calcifications RIGHT axilla.  IMPRESSION: Mild cardiomegaly, layering pleural effusions with mild interstitial prominence favoring pulmonary edema.   Electronically Signed   By: Elon Alas M.D.   On: 06/05/2015 05:35   Dg Chest Port 1 View  05/25/2015   CLINICAL DATA:  Respiratory failure.  EXAM: PORTABLE CHEST - 1 VIEW  COMPARISON:  05/23/2015  FINDINGS: Improved aeration in the lungs suggest decreased edema and possibly decreased pleural effusions. There continues to be hazy densities at the lung bases with opacification in the retrocardiac space. The nasogastric tube and endotracheal tube have been removed. Right jugular central venous catheter in the SVC region and stable. Negative for a pneumothorax.  IMPRESSION: Improved aeration in the lungs suggests decreasing pulmonary edema and possibly decreasing pleural effusions. There continues to be basilar densities which may represent residual pleural fluid and left basilar consolidation.  Support apparatuses as described.   Electronically Signed   By: Markus Daft M.D.   On: 05/25/2015 09:24   Dg Chest Port 1 View  05/23/2015   CLINICAL DATA:  Respiratory failure.  EXAM: PORTABLE CHEST - 1 VIEW  COMPARISON:  05/22/2015.  FINDINGS: Endotracheal tube, NG tube, right IJ line in stable position. Mediastinum hilar structures are stable. Heart size stable. Bilateral pulmonary infiltrates again noted. Slight interim improvement. Persistent small right pleural effusion. No pneumothorax.  IMPRESSION: 1. Lines and tubes in stable position. 2. Bilateral pulmonary infiltrates remain. There has been slight improvement.  Persistent right pleural effusion.   Electronically Signed   By: Marcello Moores  Register   On: 05/23/2015 07:24   Dg Chest Port 1 View  05/22/2015   CLINICAL DATA:  Hypoxia  EXAM: PORTABLE CHEST - 1 VIEW  COMPARISON:  Study obtained earlier in the day.  FINDINGS: Endotracheal tube tip is 3.5 cm above the carina. Nasogastric tube tip and side port are below the diaphragm. Central catheter tip is in the superior  cava. No pneumothorax. There is interstitial edema with bilateral effusions. Heart is mildly enlarged with pulmonary venous hypertension. No bone lesions.  IMPRESSION: Tube and catheter positions as described without pneumothorax. Congestive heart failure. New small left effusion compared to earlier in the day. The degree of edema and right effusion remain stable.   Electronically Signed   By: Lowella Grip III M.D.   On: 05/22/2015 08:32   Dg Chest Port 1 View  05/22/2015   CLINICAL DATA:  Decreased oxygen saturation and shortness of breath  EXAM: PORTABLE CHEST - 1 VIEW  COMPARISON:  May 21, 2015  FINDINGS: Central catheter tip is in the superior cava. No pneumothorax. There is increase in interstitial edema. There is a new right pleural effusion. Heart is mildly enlarged with mild pulmonary venous hypertension. There is atelectatic change superimposed on edema in the left base. No adenopathy.  IMPRESSION: Congestive heart failure with new right effusion increased edema compared to 1 day prior.   Electronically Signed   By: Lowella Grip III M.D.   On: 05/22/2015 07:26   Dg Chest Port 1 View  05/21/2015   CLINICAL DATA:  Followup acute renal failure  EXAM: PORTABLE CHEST - 1 VIEW  COMPARISON:  05/20/2015  FINDINGS: Right IJ central line, tip at the SVC level.  No pneumothorax.  New diffuse interstitial opacity, compatible with edema. Normal heart size and stable aortic tortuosity, accentuated by rotation. Hyperinflation which is stable from previous. No emphysematous changes on 04/25/2014 chest  CT.  IMPRESSION: 1. New right IJ central line.  No pneumothorax. 2. New mild pulmonary edema.   Electronically Signed   By: Monte Fantasia M.D.   On: 05/21/2015 15:42   Dg Chest Portable 1 View  05/20/2015   CLINICAL DATA:  Chest pain, hypotension, syncope  EXAM: PORTABLE CHEST - 1 VIEW  COMPARISON:  04/04/2014  FINDINGS: The heart size and mediastinal contours are within normal limits. Both lungs are clear. The visualized skeletal structures are unremarkable.  IMPRESSION: No active disease.   Electronically Signed   By: Lucienne Capers M.D.   On: 05/20/2015 23:35   Dg Abd Portable 1v  05/22/2015   CLINICAL DATA:  Nasogastric tube placement  EXAM: PORTABLE ABDOMEN - 1 VIEW  COMPARISON:  CT abdomen and pelvis May 21, 2015  FINDINGS: Nasogastric tube tip and side port are within the stomach. There is no bowel dilatation or air-fluid level suggesting obstruction. No free air is seen on this supine examination. There are phleboliths in the pelvis.  IMPRESSION: Nasogastric tube tip and side port in stomach. Bowel gas pattern unremarkable.   Electronically Signed   By: Lowella Grip III M.D.   On: 05/22/2015 08:33   Dg Abd Portable 1v  05/21/2015   CLINICAL DATA:  Chest and abdominal pain. Shortness of breath. Hypotension.  EXAM: PORTABLE ABDOMEN - 1 VIEW  COMPARISON:  01/07/2011  FINDINGS: The right abdomen and low pelvis are not included within the field of view. Superimposed structures obscure visualization of some of the left upper quadrant. Visualized bowel gas pattern is normal without significant colonic or small bowel distention. No radiopaque stones. Degenerative changes in the spine.  IMPRESSION: Limited study.  No evidence of bowel obstruction.   Electronically Signed   By: Lucienne Capers M.D.   On: 05/21/2015 00:00     PERTINENT LAB RESULTS: CBC:  Recent Labs  06/07/15 0235 06/08/15 0313  WBC 10.3 8.1  HGB 9.3* 10.0*  HCT 27.6* 29.9*  PLT  194 219   CMET CMP     Component  Value Date/Time   NA 139 06/09/2015 0408   NA 140 03/28/2015 0922   K 3.4* 06/09/2015 0408   CL 104 06/09/2015 0408   CO2 23 06/09/2015 0408   GLUCOSE 115* 06/09/2015 0408   GLUCOSE 88 03/28/2015 0922   BUN 11 06/09/2015 0408   BUN 24 03/28/2015 0922   CREATININE 1.18* 06/09/2015 0408   CALCIUM 8.8* 06/09/2015 0408   PROT 5.7* 06/09/2015 0408   PROT 7.2 03/28/2015 0922   ALBUMIN 2.8* 06/09/2015 0408   AST 50* 06/09/2015 0408   ALT 207* 06/09/2015 0408   ALKPHOS 252* 06/09/2015 0408   BILITOT 0.9 06/09/2015 0408   BILITOT 0.5 03/28/2015 0922   GFRNONAA 40* 06/09/2015 0408   GFRAA 47* 06/09/2015 0408    GFR Estimated Creatinine Clearance: 32 mL/min (by C-G formula based on Cr of 1.18). No results for input(s): LIPASE, AMYLASE in the last 72 hours. No results for input(s): CKTOTAL, CKMB, CKMBINDEX, TROPONINI in the last 72 hours. Invalid input(s): POCBNP No results for input(s): DDIMER in the last 72 hours. No results for input(s): HGBA1C in the last 72 hours. No results for input(s): CHOL, HDL, LDLCALC, TRIG, CHOLHDL, LDLDIRECT in the last 72 hours. No results for input(s): TSH, T4TOTAL, T3FREE, THYROIDAB in the last 72 hours.  Invalid input(s): FREET3 No results for input(s): VITAMINB12, FOLATE, FERRITIN, TIBC, IRON, RETICCTPCT in the last 72 hours. Coags:  Recent Labs  06/08/15 0313 06/09/15 0408  INR 2.23* 2.21*   Microbiology: Recent Results (from the past 240 hour(s))  Urine culture     Status: None   Collection Time: 06/05/15  4:56 AM  Result Value Ref Range Status   Specimen Description URINE, CATHETERIZED  Final   Special Requests NONE  Final   Culture NO GROWTH 1 DAY  Final   Report Status 06/06/2015 FINAL  Final  Blood culture (routine x 2)     Status: None (Preliminary result)   Collection Time: 06/05/15  5:11 AM  Result Value Ref Range Status   Specimen Description BLOOD HAND LEFT  Final   Special Requests BOTTLES DRAWN AEROBIC ONLY 5CC  Final    Culture NO GROWTH 3 DAYS  Final   Report Status PENDING  Incomplete  Culture, respiratory (NON-Expectorated)     Status: None   Collection Time: 06/05/15  2:34 PM  Result Value Ref Range Status   Specimen Description TRACHEAL ASPIRATE  Final   Special Requests NONE  Final   Gram Stain   Final    FEW WBC PRESENT, PREDOMINANTLY MONONUCLEAR RARE SQUAMOUS EPITHELIAL CELLS PRESENT NO ORGANISMS SEEN Performed at [facility]    Culture   Final    NO GROWTH 2 DAYS Performed at [facility]    Report Status 06/08/2015 FINAL  Final     BRIEF HOSPITAL COURSE:  Junctional bradycardia Bradycardia: Due to hyperkalemia-now resolved. Recommended to avoid Ace inhibitors and spironolactone in the future.  Ventilator dependent respiratory failure: due to AMS. Extubated 6/30, doing well post extubation. Now on room air.  Acute encephalopathy: Likely metabolic encephalopathy. Resolved. Awake and alert currently.  Hyperkalemia/metabolic acidosis: Likely secondary to lisinopril/Aldactone/renal failure/metformin. Avoid ACEI/ARBs/Aldactone in the future (added to allergy list). Metformin also discontinued-A1c only at 6.4-could possibly be monitored of oral hypoglycemic agents.  Atrial fibrillation with rapid ventricular response: Back in sinus rhythm. Required IV amiodarone, now transitioned to oral amiodarone. Also restarted on metoprolol. Continue Coumadin-CHA2DS2-VASc score 6.  Cardiology was consulted during this hospital stay. Discussed with son-he would like to continue Coumadin-patient has only fallen once over the past year. Have discharged on 5 mg of coumadin as patient will be on Amiodarone.Family will make appointment with PCPs office on Tuesday for INR check.  AKI: Suspect prerenal azotemia, resolving-but seems to have plateaued at 1.18. In most recent hospitalization last month developed severe ARF requiring temporary hemodialysis. Will need to avoid ACEI/ARBs/Aldactone in the  future (added to allergy list)  Hypotension: Resolved. Likely secondary to hypovolemia/volume depletion. No indication of any infection-blood culture negative so far. Would not aggressively control blood pressure-would allow some mild permissive hypertension.  Elevated liver enzymes: Likely shock liver. LFTs downtrending-follow closely as an outpatient-suggest repeating LFTs at next visit with PCP   Hypothyroidism: Continue with levothyroxine  Type 2 diabetes: CBGs stable, continue SSI. Last A1c 6.4-discontinue metformin on discharge and monitor off oral hypoglycemic agents. Follow-up with PCP for further optimization/follow-up.   Abdominal pain: Resolved. Tolerating diet and having bowel movements.  CT abdomen on 6/29- showed possible closed loop obstruction-doubt clinically-since Belly is benign and tolerating diet-doubt further workup is required at this time.  Left upper extremity swelling from ecchymosis: Improving. Supportive care.  Deconditioning: PT eval completed. Home health PT on discharge. spoke with son [name] at the time of discharge, family able to provide 24/7 care.   TODAY-DAY OF DISCHARGE:  Subjective:   Aleina Burgio today has no headache,no chest abdominal pain,no new weakness tingling or numbness, feels much better wants to go home today.   Objective:   Blood pressure 183/82, pulse 68, temperature 97.7 F (36.5 C), temperature source Oral, resp. rate 16, height 5\' 7"  (1.702 m), weight 60.374 kg (133 lb 1.6 oz), SpO2 97 %.  Intake/Output Summary (Last 24 hours) at 06/09/15 1101 Last data filed at 06/09/15 1019  Gross per 24 hour  Intake    920 ml  Output   1500 ml  Net   -580 ml   Filed Weights   06/06/15 0500 06/08/15 0621 06/09/15 0521  Weight: 60.8 kg (134 lb 0.6 oz) 59.848 kg (131 lb 15.1 oz) 60.374 kg (133 lb 1.6 oz)    Exam Awake Alert, Oriented *3, No new F.N deficits, Normal affect [place].AT,PERRAL Supple Neck,No JVD, No cervical lymphadenopathy  appriciated.  Symmetrical Chest wall movement, Good air movement bilaterally, CTAB RRR,No Gallops,Rubs or new Murmurs, No Parasternal Heave +ve B.Sounds, Abd Soft, Non tender, No organomegaly appriciated, No rebound -guarding or rigidity. No Cyanosis, Clubbing or edema, No new Rash or bruise  DISCHARGE CONDITION: Stable  DISPOSITION: Home with home health services  DISCHARGE INSTRUCTIONS:    Activity:  As tolerated with Full fall precautions use walker/cane & assistance as needed  Diet recommendation: Diabetic Diet Heart Healthy diet  Discharge Instructions    Call MD for:  difficulty breathing, headache or visual disturbances    Complete by:  As directed      Call MD for:  persistant nausea and vomiting    Complete by:  As directed      Call MD for:  severe uncontrolled pain    Complete by:  As directed      Diet - low sodium heart healthy    Complete by:  As directed      Increase activity slowly    Complete by:  As directed            Follow-up Information    Follow up with [facility].  Why:  home health    Contact information:   [phone]      Follow up with GREEN, Viviann Spare, MD. Schedule an appointment as soon as possible for a visit in 3 days.   Specialty:  Internal Medicine   Why:  for INR check and post hospital visit   Contact information:   [place] [address] [phone]       Follow up with Quay Burow, MD. Schedule an appointment as soon as possible for a visit in 1 week.   Specialties:  Cardiology, Radiology   Contact information:   [address] [place] [address] [place] [address] [phone]       Total Time spent on discharge equals 45 minutes.  SignedOren Binet 06/09/2015 11:01 AM

## 2015-06-09 NOTE — Progress Notes (Signed)
Spoke to Dr. Oren Binet and instructed to call the family members to discontinue 7.5 mg of Coumadin M-W-F instead to give Coumadin 5 mg daily until seen by the Coumadin clinic on Tuesday 06-11-2015 for follow-up check-up. Patient's family members [name]  Callaway's  wife) verbalizes understanding.

## 2015-06-09 NOTE — Progress Notes (Signed)
[facility] for Coumadin Indication: atrial fibrillation  Allergies  Allergen Reactions  . Ace Inhibitors     Severe Hyperkalemia  . Aldactone [Spironolactone]     Severe hyperkalemia causing junctional bradycardia    Patient Measurements: Height: 5\' 7"  (170.2 cm) Weight: 133 lb 1.6 oz (60.374 kg) (scale a) IBW/kg (Calculated) : 61.6  Vital Signs: Temp: 97.7 F (36.5 C) (07/03 0521) Temp Source: Oral (07/03 0521) BP: 174/66 mmHg (07/03 0521) Pulse Rate: 68 (07/03 0521)  Labs:  Recent Labs  06/07/15 0235 06/07/15 1000 06/08/15 0313 06/09/15 0408  HGB 9.3*  --  10.0*  --   HCT 27.6*  --  29.9*  --   PLT 194  --  219  --   LABPROT  --  27.9* 24.5* 24.4*  INR  --  2.65* 2.23* 2.21*  CREATININE 1.47*  --  1.18* 1.18*    Estimated Creatinine Clearance: 32 mL/min (by C-G formula based on Cr of 1.18).  Assessment: 81 yo F presents on 6/29 with acute encephalopathy after just being discharged for renal failure while being treated with mechanical ventilation and HD. On chronic coumadin for Afib.  INR elevated to 4.17 on 6/29, now has drifted down to 2.2 after holding a couple of doses. Restarted 7/1 and INR trend down appears to be slowing. Also of note patient started on amiodarone which can increase warfarin sensitivity. Will need to be cautious with dosing at discharge and have close follow up in [facility] clinic.  PTA Coumadin is 5mg  daily exc 7.5mg  on MWF (Last [facility] clinic visit was 6/27 with INR of 2.2)  Goal of Therapy:  INR 2-3 Monitor platelets by anticoagulation protocol: Yes   Plan:  Repeat coumadin 5mg  PO x 1 Monitor daily INR, CBC, s/s of bleed  Erin Hearing PharmD., BCPS Clinical Pharmacist Pager [phone] 06/09/2015 8:42 AM

## 2015-06-10 LAB — CULTURE, BLOOD (ROUTINE X 2): Culture: NO GROWTH

## 2015-06-11 ENCOUNTER — Ambulatory Visit (INDEPENDENT_AMBULATORY_CARE_PROVIDER_SITE_OTHER): Payer: Medicare Other | Admitting: Internal Medicine

## 2015-06-11 ENCOUNTER — Encounter: Payer: Self-pay | Admitting: Internal Medicine

## 2015-06-11 VITALS — HR 81 | Temp 97.4°F | Resp 20 | Ht 67.0 in | Wt 129.8 lb

## 2015-06-11 DIAGNOSIS — Z7901 Long term (current) use of anticoagulants: Secondary | ICD-10-CM

## 2015-06-11 DIAGNOSIS — E119 Type 2 diabetes mellitus without complications: Secondary | ICD-10-CM

## 2015-06-11 DIAGNOSIS — D649 Anemia, unspecified: Secondary | ICD-10-CM

## 2015-06-11 DIAGNOSIS — R1010 Upper abdominal pain, unspecified: Secondary | ICD-10-CM | POA: Diagnosis not present

## 2015-06-11 DIAGNOSIS — Z5181 Encounter for therapeutic drug level monitoring: Secondary | ICD-10-CM | POA: Diagnosis not present

## 2015-06-11 DIAGNOSIS — E038 Other specified hypothyroidism: Secondary | ICD-10-CM | POA: Diagnosis not present

## 2015-06-11 DIAGNOSIS — R748 Abnormal levels of other serum enzymes: Secondary | ICD-10-CM | POA: Insufficient documentation

## 2015-06-11 DIAGNOSIS — I10 Essential (primary) hypertension: Secondary | ICD-10-CM

## 2015-06-11 DIAGNOSIS — R413 Other amnesia: Secondary | ICD-10-CM | POA: Diagnosis not present

## 2015-06-11 DIAGNOSIS — I480 Paroxysmal atrial fibrillation: Secondary | ICD-10-CM

## 2015-06-11 MED ORDER — AMIODARONE HCL 200 MG PO TABS: ORAL_TABLET | ORAL | Status: DC

## 2015-06-11 NOTE — Progress Notes (Signed)
Patient ID: [name], female   DOB: 30-Apr-1925, 81 y.o.   MRN: [id number]    Facility  [facility]    Place of Service:   OFFICE    Allergies  Allergen Reactions  . Ace Inhibitors     Severe Hyperkalemia  . Aldactone [Spironolactone]     Severe hyperkalemia causing junctional bradycardia    Chief Complaint  Patient presents with  . Hospitalization Follow-up    HPI:  Patient has been hospitalized twice since I last saw her.  Initially hospitalized 05/20/2015 through 05/30/2015. She had acute renal failure.  Hospitalization occurred between 06/05/2015 and 06/09/2015. At that point she was bradycardic and had a metabolic acidosis. Metformin was discontinued. Blood pressure started running low and she had several medications discontinued. Since hospitalized her home blood pressures have been running high on systolic blood pressures. Diastolics remain in the normal range.  Other problems during her hospitalizations include atrial fibrillation, hypothyroidism, swelling of the left upper extremity with ecchymoses related to bleeding in the arm and possibly IV infiltration.  There were problems with abdominal pain and increased liver enzymes.  Anemia also was present. There is no evidence of gross blood loss.  Patient remains quite weak. She also was not able to relate much about her hospitalizations.  Medications: Patient's Medications  New Prescriptions   No medications on file  Previous Medications   AMIODARONE (PACERONE) 400 MG TABLET    Take 1 tablet (400 mg total) by mouth 2 (two) times daily. Take 400 mg BID till 06/13/15, and then, Take 200 mg BID from 06/14/15 for 1 month,and then Take 200 mg daily and stay on it   AMLODIPINE (NORVASC) 5 MG TABLET    Take 1 tablet (5 mg total) by mouth daily.   CALCIUM CARBONATE (TUMS - DOSED IN MG ELEMENTAL CALCIUM) 500 MG CHEWABLE TABLET    Chew 2 tablets by mouth daily as needed for indigestion or heartburn.   ESTRADIOL (ESTRACE) 0.5 MG  TABLET    TAKE 1 TABLET EVERY DAY FOR HORMONES   FEEDING SUPPLEMENT, ENSURE ENLIVE, (ENSURE ENLIVE) LIQD    Take 237 mLs by mouth 2 (two) times daily between meals.   HYDROXYPROPYL METHYLCELLULOSE (ISOPTO TEARS) 2.5 % OPHTHALMIC SOLUTION    Place 1 drop into both eyes as needed for dry eyes.   LEVOTHYROXINE (SYNTHROID, LEVOTHROID) 75 MCG TABLET    Take 1 tablet (75 mcg total) by mouth daily before breakfast.   VITAMINS A & D (SWEEN 24) CREA    Apply 1 application topically daily.   WARFARIN (COUMADIN) 5 MG TABLET    Take 1 tablets by mouth daily as directed by coumadin clinic  Modified Medications   No medications on file  Discontinued Medications   WARFARIN (COUMADIN) 7.5 MG TABLET    Take 7.5 mg by mouth daily at 6 PM. Mon, Wed, Fri     Review of Systems  Constitutional: Positive for fatigue.  Eyes: Negative.   Respiratory: Positive for shortness of breath.   Cardiovascular: Positive for palpitations. Negative for chest pain and leg swelling.  Gastrointestinal: Positive for diarrhea. Negative for nausea, abdominal pain and abdominal distention.       Incontinent of stool sometimes  Endocrine:       History of thyroid problems and diabetes.  Genitourinary: Negative.        Diarrhea. No cramps. No blood in stool.  Musculoskeletal: Positive for arthralgias and gait problem.       Sometimes she feels like  she could fall over when she walks through her yard.   Neurological: Positive for weakness.       Mild memory deficits.  Hematological: Negative.   Psychiatric/Behavioral: Negative.     Filed Vitals:   06/11/15 1603  Pulse: 81  Temp: 97.4 F (36.3 C)  TempSrc: Oral  Resp: 20  Height: 5\' 7"  (1.702 m)  Weight: 129 lb 12.8 oz (58.877 kg)  SpO2: 98%   Body mass index is 20.32 kg/(m^2).  Physical Exam  Constitutional: She is oriented to person, place, and time. No distress.  Frail elderly female  HENT:  Head: Normocephalic and atraumatic.  Nose: Nose normal.    Mouth/Throat: No oropharyngeal exudate.  Eyes: Conjunctivae and EOM are normal. Pupils are equal, round, and reactive to light.  Prescription lenses  Neck: Normal range of motion. Neck supple. No JVD present. No tracheal deviation present. No thyromegaly present.  Chronically hoarse.  Cardiovascular: Normal rate, regular rhythm, normal heart sounds and intact distal pulses.  Exam reveals no gallop and no friction rub.   No murmur heard. Pulmonary/Chest: Effort normal and breath sounds normal. No respiratory distress. She has no wheezes. She has no rales.  Abdominal: Soft. Bowel sounds are normal. She exhibits no distension and no mass. There is no tenderness.  Musculoskeletal: She exhibits no tenderness.  Reduced extension and flexion of the right knee. Heberden's nodes of the hands. Hammer toe on the right foot 2nd toe. No focal back pain on percussion and oalpation. Left arm swollen and bruised appearing. It is tender to touch. There is mild increase in surface temperature in the area of the left hand and wrist.  Lymphadenopathy:    She has no cervical adenopathy.  Neurological: She is alert and oriented to person, place, and time. No cranial nerve deficit.  Slow and sometimes vague responses to questions Diminished vibratory sensation in the great toes.  Skin: Skin is warm and dry. No rash noted. No erythema. No pallor.  Psychiatric: She has a normal mood and affect. Her behavior is normal. Thought content normal.     Labs reviewed: Admission on 06/05/2015, Discharged on 06/09/2015  No results displayed because visit has over 200 results.    Anti-coag visit on 06/03/2015  Component Date Value Ref Range Status  . INR 06/03/2015 2.2   Final   [facility]  Admission on 05/20/2015, Discharged on 05/30/2015  No results displayed because visit has over 200 results.    Anti-coag visit on 05/03/2015  Component Date Value Ref Range Status  . INR 05/03/2015 1.9   Final  Anti-coag visit  on 04/15/2015  Component Date Value Ref Range Status  . INR 04/15/2015 1.6   Final  Anti-coag visit on 04/01/2015  Component Date Value Ref Range Status  . INR 04/01/2015 2   Final  Appointment on 03/28/2015  Component Date Value Ref Range Status  . TSH 03/28/2015 2.950  0.450 - 4.500 uIU/mL Final  . Glucose 03/28/2015 88  65 - 99 mg/dL Final  . BUN 03/28/2015 24  8 - 27 mg/dL Final  . Creatinine, Ser 03/28/2015 1.00  0.57 - 1.00 mg/dL Final  . GFR calc non Af Amer 03/28/2015 51* >59 mL/min/1.73 Final  . GFR calc Af Amer 03/28/2015 59* >59 mL/min/1.73 Final  . BUN/Creatinine Ratio 03/28/2015 24  11 - 26 Final  . Sodium 03/28/2015 140  134 - 144 mmol/L Final  . Potassium 03/28/2015 4.6  3.5 - 5.2 mmol/L Final  . Chloride 03/28/2015 99  97 - 108 mmol/L Final  . CO2 03/28/2015 24  18 - 29 mmol/L Final  . Calcium 03/28/2015 10.3  8.7 - 10.3 mg/dL Final  . Total Protein 03/28/2015 7.2  6.0 - 8.5 g/dL Final  . Albumin 03/28/2015 4.6  3.5 - 4.7 g/dL Final  . Globulin, Total 03/28/2015 2.6  1.5 - 4.5 g/dL Final  . Albumin/Globulin Ratio 03/28/2015 1.8  1.1 - 2.5 Final  . Bilirubin Total 03/28/2015 0.5  0.0 - 1.2 mg/dL Final  . Alkaline Phosphatase 03/28/2015 107  39 - 117 IU/L Final  . AST 03/28/2015 20  0 - 40 IU/L Final  . ALT 03/28/2015 11  0 - 32 IU/L Final  . Hgb A1c MFr Bld 03/28/2015 6.4* 4.8 - 5.6 % Final   Comment:          Pre-diabetes: 5.7 - 6.4          Diabetes: >6.4          Glycemic control for adults with diabetes: <7.0   . Est. average glucose Bld gHb Est-m* 03/28/2015 137   Final     Assessment/Plan  1. Diabetes mellitus type 2, controlled stable - CMP  2. Essential hypertension Elevated SBP. Resume lisinopril 5 mg daily  3. Liver enzyme elevation - CMP  4. Anemia, unspecified anemia type - CBC With Differential - Reticulocytes  5. Other specified hypothyroidism - TSH  6. Anticoagulated on Coumadin Continue warfarin 5 mg daily. - Protime-INR  7.  Paroxysmal atrial fibrillation - amiodarone (PACERONE) 200 MG tablet; On 06/14/15 start 200 mg twice daily for 1 month, then reduce to 200 mg daily to regulate heart rhythm.  Dispense: 30 tablet; Refill: 3  8. Long term current use of anticoagulant therapy Continue warfarin 5 mg daily  9. Memory deficit Chronic. Gradual worsening. Appears worse today than prior to her hospitalization.  10. Pain of upper abdomen Resolved

## 2015-06-11 NOTE — Patient Instructions (Addendum)
Resume lisinopril 5 mg daily. Continue warfarin 5 mg daily unless you get a change in directions when your lab is reported. Elevate left arm to level of the heart when you can.

## 2015-06-12 ENCOUNTER — Other Ambulatory Visit: Payer: Self-pay | Admitting: *Deleted

## 2015-06-12 ENCOUNTER — Telehealth: Payer: Self-pay

## 2015-06-12 LAB — CBC WITH DIFFERENTIAL
BASOS: 1 %
Basophils Absolute: 0.1 10*3/uL (ref 0.0–0.2)
EOS (ABSOLUTE): 0.2 10*3/uL (ref 0.0–0.4)
Eos: 2 %
HEMATOCRIT: 35.3 % (ref 34.0–46.6)
Hemoglobin: 12.2 g/dL (ref 11.1–15.9)
IMMATURE GRANS (ABS): 0 10*3/uL (ref 0.0–0.1)
Immature Granulocytes: 0 %
LYMPHS ABS: 2 10*3/uL (ref 0.7–3.1)
Lymphs: 22 %
MCH: 33.5 pg — ABNORMAL HIGH (ref 26.6–33.0)
MCHC: 34.6 g/dL (ref 31.5–35.7)
MCV: 97 fL (ref 79–97)
Monocytes Absolute: 1.3 10*3/uL — ABNORMAL HIGH (ref 0.1–0.9)
Monocytes: 14 %
Neutrophils Absolute: 5.8 10*3/uL (ref 1.4–7.0)
Neutrophils: 61 %
RBC: 3.64 x10E6/uL — ABNORMAL LOW (ref 3.77–5.28)
RDW: 15.7 % — AB (ref 12.3–15.4)
WBC: 9.4 10*3/uL (ref 3.4–10.8)

## 2015-06-12 LAB — COMPREHENSIVE METABOLIC PANEL
ALT: 130 IU/L — AB (ref 0–32)
AST: 29 IU/L (ref 0–40)
Albumin/Globulin Ratio: 1.5 (ref 1.1–2.5)
Albumin: 4.3 g/dL (ref 3.5–4.7)
Alkaline Phosphatase: 280 IU/L — ABNORMAL HIGH (ref 39–117)
BUN/Creatinine Ratio: 14 (ref 11–26)
BUN: 15 mg/dL (ref 8–27)
Bilirubin Total: 0.7 mg/dL (ref 0.0–1.2)
CO2: 22 mmol/L (ref 18–29)
Calcium: 9.5 mg/dL (ref 8.7–10.3)
Chloride: 101 mmol/L (ref 97–108)
Creatinine, Ser: 1.06 mg/dL — ABNORMAL HIGH (ref 0.57–1.00)
GFR calc Af Amer: 55 mL/min/{1.73_m2} — ABNORMAL LOW (ref >59)
GFR calc non Af Amer: 47 mL/min/{1.73_m2} — ABNORMAL LOW (ref >59)
Globulin, Total: 2.8 g/dL (ref 1.5–4.5)
Glucose: 133 mg/dL — ABNORMAL HIGH (ref 65–99)
POTASSIUM: 3.9 mmol/L (ref 3.5–5.2)
Sodium: 140 mmol/L (ref 134–144)
Total Protein: 7.1 g/dL (ref 6.0–8.5)

## 2015-06-12 LAB — PROTIME-INR
INR: 3.2 — AB (ref 0.8–1.2)
PROTHROMBIN TIME: 33.1 s — AB (ref 9.1–12.0)

## 2015-06-12 LAB — TSH: TSH: 10.34 u[IU]/mL — AB (ref 0.450–4.500)

## 2015-06-12 LAB — RETICULOCYTES: RETIC CT PCT: 4.1 % — AB (ref 0.6–2.6)

## 2015-06-12 MED ORDER — LEVOTHYROXINE SODIUM 100 MCG PO TABS: 100.0000 ug | ORAL_TABLET | Freq: Every day | ORAL | Status: AC

## 2015-06-12 NOTE — Telephone Encounter (Signed)
Patients daughter in law left message on triage voicemail requesting reference range for B/P. Patients daughter in law is specifically concerned about the top number and questions if patient can hold amiodarone if B/P is low

## 2015-06-12 NOTE — Telephone Encounter (Signed)
She should continue the amiodarone. Ideal blood pressure range for the systolic blood pressure would be somewhere between 110 and 140.

## 2015-06-12 NOTE — Telephone Encounter (Signed)
Thyroid tests show a further rise in the TSH level. She needs to have levothyroxin increased to 100 g daily. Call in prescription for 90 tablets. One tablet daily for thyroid supplement. Refill 3.

## 2015-06-13 ENCOUNTER — Other Ambulatory Visit: Payer: Self-pay

## 2015-06-13 DIAGNOSIS — I480 Paroxysmal atrial fibrillation: Secondary | ICD-10-CM

## 2015-06-13 MED ORDER — AMIODARONE HCL 200 MG PO TABS: ORAL_TABLET | ORAL | Status: DC

## 2015-06-13 NOTE — Telephone Encounter (Signed)
Left message on voicemail for patient to return call when available (daughter's mobile number). Patient's home number is not a working number

## 2015-06-18 NOTE — Telephone Encounter (Signed)
Patients daughter was given instructions last week and verbalized understanding

## 2015-06-21 ENCOUNTER — Ambulatory Visit (INDEPENDENT_AMBULATORY_CARE_PROVIDER_SITE_OTHER): Payer: Medicare Other | Admitting: Cardiovascular Disease

## 2015-06-21 ENCOUNTER — Encounter: Payer: Self-pay | Admitting: Cardiovascular Disease

## 2015-06-21 VITALS — BP 150/70 | HR 74 | Ht 65.0 in | Wt 123.0 lb

## 2015-06-21 DIAGNOSIS — I480 Paroxysmal atrial fibrillation: Secondary | ICD-10-CM

## 2015-06-21 DIAGNOSIS — I10 Essential (primary) hypertension: Secondary | ICD-10-CM

## 2015-06-21 NOTE — Patient Instructions (Signed)
We request that you follow-up in: 6 months with an extender and in 1 year with Dr Berry  You will receive a reminder letter in the mail two months in advance. If you don't receive a letter, please call our office to schedule the follow-up appointment.   

## 2015-06-21 NOTE — Progress Notes (Signed)
06/21/2015 [name]   09-Nov-1925  [id number]  Primary Physician GREEN, Viviann Spare, MD Primary Cardiologist: Lorretta Harp MD [name]'  HPI:  The patient is an 81 year old, thin-appearing, widowed Caucasian female, mother of 55, grandmother to 4 grandchildren who I saw 01/09/15.Marland Kitchen She has a history of paroxysmal atrial fibrillation with syncope in the past on Coumadin anticoagulation. She was catheterized by Dr. Alla German Apr 21, 2007, revealing essentially normal coronary arteries and normal LV function. Her other problems include hypertension and insulin-dependent diabetes. She also apparently has obstructive sleep apnea but does not tolerate CPAP. Dr. Nyoka Cowden apparently has stopped her amiodarone and she did run out of her metoprolol which resulted in recurrent symptoms; however, since she has had that refilled she has been fairly asymptomatic. Since I saw her a year ago she denies chest pain or shortness of breath. She did have episode of falling on her face which was a result of tripping and not syncope. She gets fairly frequent dizziness. She did see Dr. Trula Slade for vascular evaluation. Carotid Dopplers were unrevealing. She was admitted recently with bradycardia, acute renal insufficiency and encephalopathy with lactic acidosis. She is doing well after discharge.   Current Outpatient Prescriptions  Medication Sig Dispense Refill  . amiodarone (PACERONE) 200 MG tablet On 06/14/15 start 200 mg twice daily for 1 month, then reduce to 200 mg daily to regulate heart rhythm. 90 tablet 1  . amLODipine (NORVASC) 5 MG tablet Take 1 tablet (5 mg total) by mouth daily. 30 tablet 0  . calcium carbonate (TUMS - DOSED IN MG ELEMENTAL CALCIUM) 500 MG chewable tablet Chew 2 tablets by mouth daily as needed for indigestion or heartburn.    . estradiol (ESTRACE) 0.5 MG tablet TAKE 1 TABLET EVERY DAY FOR HORMONES 90 tablet 3  . feeding supplement, ENSURE ENLIVE, (ENSURE ENLIVE) LIQD Take  237 mLs by mouth 2 (two) times daily between meals. 60 Bottle 0  . hydroxypropyl methylcellulose (ISOPTO TEARS) 2.5 % ophthalmic solution Place 1 drop into both eyes as needed for dry eyes.    Marland Kitchen levothyroxine (SYNTHROID, LEVOTHROID) 100 MCG tablet Take 1 tablet (100 mcg total) by mouth daily. 90 tablet 3  . levothyroxine (SYNTHROID, LEVOTHROID) 75 MCG tablet Take 1 tablet (75 mcg total) by mouth daily before breakfast. (Patient taking differently: Take 75 mcg by mouth daily before breakfast. 75 mcg on Mon, Tues, Thurs, Sat, and Sun) 30 tablet 5  . lisinopril (PRINIVIL,ZESTRIL) 5 MG tablet Take 5 mg by mouth daily.  1  . Vitamins A & D (SWEEN 24) CREA Apply 1 application topically daily.    Marland Kitchen warfarin (COUMADIN) 5 MG tablet Take 1 tablets by mouth daily as directed by coumadin clinic 40 tablet 3   No current facility-administered medications for this visit.    Allergies  Allergen Reactions  . Ace Inhibitors     Severe Hyperkalemia  . Aldactone [Spironolactone]     Severe hyperkalemia causing junctional bradycardia    History   Social History  . Marital Status: Widowed    Spouse Name: N/A  . Number of Children: Y  . Years of Education: N/A   Occupational History  . retired from Personal assistant    Social History Main Topics  . Smoking status: Never Smoker   . Smokeless tobacco: Not on file  . Alcohol Use: No  . Drug Use: No  . Sexual Activity: No   Other Topics Concern  . Not on file  Social History Narrative   Lives alone.     Review of Systems: General: negative for chills, fever, night sweats or weight changes.  Cardiovascular: negative for chest pain, dyspnea on exertion, edema, orthopnea, palpitations, paroxysmal nocturnal dyspnea or shortness of breath Dermatological: negative for rash Respiratory: negative for cough or wheezing Urologic: negative for hematuria Abdominal: negative for nausea, vomiting, diarrhea, bright red blood per rectum, melena, or  hematemesis Neurologic: negative for visual changes, syncope, or dizziness All other systems reviewed and are otherwise negative except as noted above.    Blood pressure 150/70, pulse 74, height 5\' 5"  (1.651 m), weight 123 lb (55.792 kg).  General appearance: alert and no distress Neck: no adenopathy, no carotid bruit, no JVD, supple, symmetrical, trachea midline and thyroid not enlarged, symmetric, no tenderness/mass/nodules Lungs: clear to auscultation bilaterally Heart: regular rate and rhythm, S1, S2 normal, no murmur, click, rub or gallop Extremities: extremities normal, atraumatic, no cyanosis or edema  EKG normal sinus rhythm at 74 without ST or T-wave changes. I personally reviewed this EKG  ASSESSMENT AND PLAN:   HTN (hypertension) History of hypertension with blood pressure measured at 150/70. She is on amlodipine and lisinopril. Continue current medications at current dosing  Atrial fibrillation History of paroxysmal atrial fibrillation maintaining sinus rhythm on Coumadin anticoagulation and amiodarone.      Lorretta Harp MD FACP,FACC,FAHA, [facility] 06/21/2015 2:28 PM

## 2015-06-21 NOTE — Assessment & Plan Note (Signed)
History of paroxysmal atrial fibrillation maintaining sinus rhythm on Coumadin anticoagulation and amiodarone.

## 2015-06-21 NOTE — Assessment & Plan Note (Signed)
History of hypertension with blood pressure measured at 150/70. She is on amlodipine and lisinopril. Continue current medications at current dosing

## 2015-06-22 ENCOUNTER — Other Ambulatory Visit: Payer: Self-pay | Admitting: Cardiovascular Disease

## 2015-06-26 ENCOUNTER — Other Ambulatory Visit: Payer: Self-pay | Admitting: Internal Medicine

## 2015-06-27 ENCOUNTER — Ambulatory Visit: Payer: PRIVATE HEALTH INSURANCE | Admitting: Cardiovascular Disease

## 2015-07-07 ENCOUNTER — Other Ambulatory Visit: Payer: Self-pay | Admitting: Internal Medicine

## 2015-07-11 ENCOUNTER — Telehealth: Payer: Self-pay | Admitting: *Deleted

## 2015-07-11 ENCOUNTER — Telehealth: Payer: Self-pay | Admitting: Pharmacist Clinician (PhC)/ Clinical Pharmacy Specialist

## 2015-07-11 DIAGNOSIS — Z7901 Long term (current) use of anticoagulants: Secondary | ICD-10-CM

## 2015-07-11 DIAGNOSIS — I4891 Unspecified atrial fibrillation: Secondary | ICD-10-CM

## 2015-07-11 NOTE — Telephone Encounter (Signed)
Daughter in [place] called and wanted to know if she should bring patient here or not to have INR checked. I looked on last lab and it stated that daughter in law was going to see in the in house nurse could draw it or not and let us know. Daughter in law stated that the nurse never came back so she needed to schedule an appointment for her to have done here. Lab appointment made and order placed for Monday.

## 2015-07-12 NOTE — Telephone Encounter (Signed)
Close encounter 

## 2015-07-13 ENCOUNTER — Other Ambulatory Visit: Payer: Self-pay | Admitting: Internal Medicine

## 2015-07-15 ENCOUNTER — Other Ambulatory Visit: Payer: Self-pay

## 2015-07-15 ENCOUNTER — Ambulatory Visit (INDEPENDENT_AMBULATORY_CARE_PROVIDER_SITE_OTHER): Payer: Medicare Other | Admitting: Pharmacist Clinician (PhC)/ Clinical Pharmacy Specialist

## 2015-07-15 ENCOUNTER — Telehealth: Payer: Self-pay | Admitting: Cardiovascular Disease

## 2015-07-15 DIAGNOSIS — Z7901 Long term (current) use of anticoagulants: Secondary | ICD-10-CM

## 2015-07-15 DIAGNOSIS — I4891 Unspecified atrial fibrillation: Secondary | ICD-10-CM

## 2015-07-15 LAB — PROTIME-INR
INR: 6.15 (ref <1.50)
Prothrombin Time: 55 seconds — ABNORMAL HIGH (ref 11.6–15.2)

## 2015-07-15 LAB — POCT INR

## 2015-07-15 NOTE — Telephone Encounter (Signed)
See anticoag note

## 2015-07-15 NOTE — Telephone Encounter (Signed)
Jenny Reichmann is calling with a Critical INR

## 2015-07-15 NOTE — Telephone Encounter (Signed)
[facility] reporting patient's PT INR  PT 55.0 INR 6.15  Routed to [place] to advise.

## 2015-07-22 ENCOUNTER — Ambulatory Visit (INDEPENDENT_AMBULATORY_CARE_PROVIDER_SITE_OTHER): Payer: Medicare Other | Admitting: Pharmacist Clinician (PhC)/ Clinical Pharmacy Specialist

## 2015-07-22 ENCOUNTER — Other Ambulatory Visit: Payer: Self-pay | Admitting: Internal Medicine

## 2015-07-22 DIAGNOSIS — I4891 Unspecified atrial fibrillation: Secondary | ICD-10-CM | POA: Diagnosis not present

## 2015-07-22 DIAGNOSIS — Z7901 Long term (current) use of anticoagulants: Secondary | ICD-10-CM

## 2015-07-22 LAB — POCT INR: INR: 1.9

## 2015-07-29 ENCOUNTER — Ambulatory Visit (INDEPENDENT_AMBULATORY_CARE_PROVIDER_SITE_OTHER): Payer: Medicare Other | Admitting: Pharmacist Clinician (PhC)/ Clinical Pharmacy Specialist

## 2015-07-29 DIAGNOSIS — Z7901 Long term (current) use of anticoagulants: Secondary | ICD-10-CM

## 2015-07-29 DIAGNOSIS — I4891 Unspecified atrial fibrillation: Secondary | ICD-10-CM

## 2015-07-29 LAB — POCT INR: INR: 3.8

## 2015-08-08 ENCOUNTER — Other Ambulatory Visit: Payer: Medicare Other

## 2015-08-08 ENCOUNTER — Other Ambulatory Visit: Payer: Self-pay | Admitting: Internal Medicine

## 2015-08-08 DIAGNOSIS — E1165 Type 2 diabetes mellitus with hyperglycemia: Secondary | ICD-10-CM

## 2015-08-08 DIAGNOSIS — I10 Essential (primary) hypertension: Secondary | ICD-10-CM

## 2015-08-08 DIAGNOSIS — E039 Hypothyroidism, unspecified: Secondary | ICD-10-CM

## 2015-08-09 LAB — COMPREHENSIVE METABOLIC PANEL
ALK PHOS: 154 IU/L — AB (ref 39–117)
ALT: 27 IU/L (ref 0–32)
AST: 31 IU/L (ref 0–40)
Albumin/Globulin Ratio: 1.4 (ref 1.1–2.5)
Albumin: 4.2 g/dL (ref 3.5–4.7)
BUN/Creatinine Ratio: 25 (ref 11–26)
BUN: 25 mg/dL (ref 8–27)
Bilirubin Total: <0.2 mg/dL (ref 0.0–1.2)
CHLORIDE: 97 mmol/L (ref 97–108)
CO2: 23 mmol/L (ref 18–29)
CREATININE: 1.02 mg/dL — AB (ref 0.57–1.00)
Calcium: 9.9 mg/dL (ref 8.7–10.3)
GFR calc Af Amer: 57 mL/min/{1.73_m2} — ABNORMAL LOW (ref >59)
GFR calc non Af Amer: 50 mL/min/{1.73_m2} — ABNORMAL LOW (ref >59)
GLUCOSE: 106 mg/dL — AB (ref 65–99)
Globulin, Total: 2.9 g/dL (ref 1.5–4.5)
Potassium: 3.9 mmol/L (ref 3.5–5.2)
Sodium: 140 mmol/L (ref 134–144)
Total Protein: 7.1 g/dL (ref 6.0–8.5)

## 2015-08-09 LAB — TSH: TSH: 1.81 u[IU]/mL (ref 0.450–4.500)

## 2015-08-09 LAB — HEMOGLOBIN A1C
Est. average glucose Bld gHb Est-mCnc: 143 mg/dL
HEMOGLOBIN A1C: 6.6 % — AB (ref 4.8–5.6)

## 2015-08-09 LAB — MICROALBUMIN, URINE: Microalbumin, Urine: 4.1 ug/mL

## 2015-08-13 ENCOUNTER — Ambulatory Visit (INDEPENDENT_AMBULATORY_CARE_PROVIDER_SITE_OTHER): Payer: Medicare Other | Admitting: Internal Medicine

## 2015-08-13 ENCOUNTER — Other Ambulatory Visit: Payer: Self-pay | Admitting: *Deleted

## 2015-08-13 ENCOUNTER — Encounter: Payer: Self-pay | Admitting: Internal Medicine

## 2015-08-13 VITALS — BP 118/64 | HR 67 | Temp 97.4°F | Ht 65.0 in | Wt 123.0 lb

## 2015-08-13 DIAGNOSIS — Z23 Encounter for immunization: Secondary | ICD-10-CM

## 2015-08-13 DIAGNOSIS — E1165 Type 2 diabetes mellitus with hyperglycemia: Secondary | ICD-10-CM | POA: Diagnosis not present

## 2015-08-13 DIAGNOSIS — I480 Paroxysmal atrial fibrillation: Secondary | ICD-10-CM

## 2015-08-13 DIAGNOSIS — R413 Other amnesia: Secondary | ICD-10-CM | POA: Diagnosis not present

## 2015-08-13 DIAGNOSIS — E038 Other specified hypothyroidism: Secondary | ICD-10-CM | POA: Diagnosis not present

## 2015-08-13 DIAGNOSIS — Z7901 Long term (current) use of anticoagulants: Secondary | ICD-10-CM | POA: Diagnosis not present

## 2015-08-13 DIAGNOSIS — D649 Anemia, unspecified: Secondary | ICD-10-CM | POA: Diagnosis not present

## 2015-08-13 DIAGNOSIS — R1010 Upper abdominal pain, unspecified: Secondary | ICD-10-CM | POA: Diagnosis not present

## 2015-08-13 DIAGNOSIS — R269 Unspecified abnormalities of gait and mobility: Secondary | ICD-10-CM

## 2015-08-13 DIAGNOSIS — I10 Essential (primary) hypertension: Secondary | ICD-10-CM | POA: Diagnosis not present

## 2015-08-13 MED ORDER — AMLODIPINE BESYLATE 5 MG PO TABS: ORAL_TABLET | ORAL | Status: AC

## 2015-08-13 NOTE — Telephone Encounter (Signed)
[facility] [address] 

## 2015-08-13 NOTE — Progress Notes (Signed)
Patient ID: [name], female   DOB: Mar 07, 1925, 81 y.o.   MRN: [id number]    Facility  [facility]    Place of Service:   OFFICE    Allergies  Allergen Reactions  . Ace Inhibitors     Severe Hyperkalemia  . Aldactone [Spironolactone]     Severe hyperkalemia causing junctional bradycardia    Chief Complaint  Patient presents with  . Medical Management of Chronic Issues    4 month follow-up, MMSE, discuss labs (copy printed)    HPI:  Patient is emotionally labile today. She was unable to complete the Mini-Mental status exam due to tears and anxiety. She is worried about her memory.  Type 2 diabetes mellitus with hyperglycemia - controlled; last glucose 106  Essential hypertension - controlled  Other specified hypothyroidism - compensated with last TSH 1.810  Memory deficit - gradually worsening. Unable to complete any mental status exam today due to emotional lability.  Paroxysmal atrial fibrillation - unable to recall any recent events of palpitations  Long term current use of anticoagulant therapy - remains on warfarin due to history of PAF  Pain of upper abdomen - resolved  Anemia, unspecified anemia type - resolved  Abnormality of gait - unsteady. Poor balance.    Medications: Patient's Medications  New Prescriptions   No medications on file  Previous Medications   AMIODARONE (PACERONE) 200 MG TABLET    TAKE 1 TABLET BY MOUTH TWICE A DAY X1 MONTH THEN TAKE 1 TABLET EVERY DAY   AMLODIPINE (NORVASC) 5 MG TABLET    Take one tablet by mouth once daily to control blood pressure   CALCIUM CARBONATE (TUMS - DOSED IN MG ELEMENTAL CALCIUM) 500 MG CHEWABLE TABLET    Chew 2 tablets by mouth daily as needed for indigestion or heartburn.   ESTRADIOL (ESTRACE) 0.5 MG TABLET    TAKE 1 TABLET EVERY DAY FOR HORMONES   FEEDING SUPPLEMENT, ENSURE ENLIVE, (ENSURE ENLIVE) LIQD    Take 237 mLs by mouth 2 (two) times daily between meals.   HYDROXYPROPYL METHYLCELLULOSE (ISOPTO TEARS)  2.5 % OPHTHALMIC SOLUTION    Place 1 drop into both eyes as needed for dry eyes.   LEVOTHYROXINE (SYNTHROID, LEVOTHROID) 100 MCG TABLET    Take 1 tablet (100 mcg total) by mouth daily.   LISINOPRIL (PRINIVIL,ZESTRIL) 5 MG TABLET    TAKE 1 TABLET BY MOUTH EVERY DAY   VITAMINS A & D (SWEEN 24) CREA    Apply 1 application topically daily.   WARFARIN (COUMADIN) 5 MG TABLET    Take 1 tablets by mouth daily as directed by coumadin clinic  Modified Medications   No medications on file  Discontinued Medications   LEVOTHYROXINE (SYNTHROID, LEVOTHROID) 75 MCG TABLET    Take 1 tablet (75 mcg total) by mouth daily before breakfast.   LISINOPRIL (PRINIVIL,ZESTRIL) 5 MG TABLET    Take 5 mg by mouth daily.     Review of Systems  Constitutional: Positive for fatigue.  Eyes: Negative.   Respiratory: Positive for shortness of breath.   Cardiovascular: Positive for palpitations. Negative for chest pain and leg swelling.  Gastrointestinal: Positive for diarrhea. Negative for nausea, abdominal pain and abdominal distention.       Incontinent of stool sometimes  Endocrine:       History of thyroid problems and diabetes.  Genitourinary: Negative.        Diarrhea. No cramps. No blood in stool.  Musculoskeletal: Positive for arthralgias and gait problem.  Sometimes she feels like she could fall over when she walks through her yard.  Neurological: Positive for weakness.       Mild memory deficits.  Hematological: Negative.   Psychiatric/Behavioral: Negative.     Filed Vitals:   08/13/15 1359  BP: 118/64  Pulse: 67  Temp: 97.4 F (36.3 C)  TempSrc: Oral  Height: 5' 5"  (1.651 m)  Weight: 123 lb (55.792 kg)  SpO2: 97%   Body mass index is 20.47 kg/(m^2).  Physical Exam  Constitutional: She is oriented to person, place, and time. No distress.  Frail elderly female Possible 11 pound weight loss since late June 2016  HENT:  Head: Normocephalic and atraumatic.  Nose: Nose normal.    Mouth/Throat: No oropharyngeal exudate.  Eyes: Conjunctivae and EOM are normal. Pupils are equal, round, and reactive to light.  Prescription lenses  Neck: Normal range of motion. Neck supple. No JVD present. No tracheal deviation present. No thyromegaly present.  Chronically hoarse.  Cardiovascular: Normal rate, regular rhythm, normal heart sounds and intact distal pulses.  Exam reveals no gallop and no friction rub.   No murmur heard. Pulmonary/Chest: Effort normal and breath sounds normal. No respiratory distress. She has no wheezes. She has no rales.  Abdominal: Soft. Bowel sounds are normal. She exhibits no distension and no mass. There is no tenderness.  Musculoskeletal: She exhibits no tenderness.  Reduced extension and flexion of the right knee. Heberden's nodes of the hands. Hammer toe on the right foot 2nd toe. No focal back pain on percussion and oalpation. Left arm swollen and bruised appearing. It is tender to touch. There is mild increase in surface temperature in the area of the left hand and wrist.  Lymphadenopathy:    She has no cervical adenopathy.  Neurological: She is alert and oriented to person, place, and time. No cranial nerve deficit.  Slow and sometimes vague responses to questions. Impaired short-term recall. Diminished vibratory sensation in the great toes.  Skin: Skin is warm and dry. No rash noted. No erythema. No pallor.  Psychiatric:  Emotionally labile. Distraught over her inability to answer questions on Mini-Mental status exam.     Labs reviewed: Lab Summary Latest Ref Rng 08/08/2015 06/11/2015 06/09/2015 06/08/2015 06/07/2015  Hemoglobin 11.1 - 15.9 g/dL (None) 12.2 (None) 10.0(L) 9.3(L)  Hematocrit 34.0 - 46.6 % (None) 35.3 (None) 29.9(L) 27.6(L)  White count 3.4 - 10.8 x10E3/uL (None) 9.4 (None) 8.1 10.3  Platelet count 150 - 400 K/uL (None) (None) (None) 219 194  Sodium 134 - 144 mmol/L 140 140 139 138 138  Potassium 3.5 - 5.2 mmol/L 3.9 3.9 3.4(L) 3.8  3.6  Calcium 8.7 - 10.3 mg/dL 9.9 9.5 8.8(L) 8.7(L) 8.3(L)  Phosphorus - (None) (None) (None) (None) (None)  Creatinine 0.57 - 1.00 mg/dL 1.02(H) 1.06(H) 1.18(H) 1.18(H) 1.47(H)  AST 0 - 40 IU/L 31 29 50(H) (None) 253(H)  Alk Phos 39 - 117 IU/L 154(H) 280(H) 252(H) (None) 261(H)  Bilirubin 0.0 - 1.2 mg/dL <0.2 0.7 0.9 (None) 0.3  Glucose 65 - 99 mg/dL 106(H) 133(H) 115(H) 131(H) 142(H)  Cholesterol - (None) (None) (None) (None) (None)  HDL cholesterol - (None) (None) (None) (None) (None)  Triglycerides - (None) (None) (None) (None) (None)  LDL Direct - (None) (None) (None) (None) (None)  LDL Calc - (None) (None) (None) (None) (None)  Total protein 6.5 - 8.1 g/dL (None) (None) 5.7(L) (None) 5.3(L)  Albumin 3.5 - 4.7 g/dL 4.2 4.3 2.8(L) (None) 2.6(L)   Lab Results  Component  Value Date   TSH 1.810 08/08/2015   Lab Results  Component Value Date   BUN 25 08/08/2015   Lab Results  Component Value Date   HGBA1C 6.6* 08/08/2015       Assessment/Plan  1. Type 2 diabetes mellitus with hyperglycemia Continue to monitor. Not presently taking any medication. Avoid sweets in diet. - Comprehensive metabolic panel; Future - Hemoglobin A1c; Future  2. Essential hypertension Controlled   3. Other specified hypothyroidism - TSH; Future  4. Memory deficit Consider starting donepezil next visit  5. Paroxysmal atrial fibrillation Currently in normal sinus rhythm  6. Long term current use of anticoagulant therapy - Protime-INR  7. Pain of upper abdomen Resolved  8. Anemia, unspecified anemia type Resolved - CBC With Differential; Future  9. Abnormality of gait Patient really should be using a walker or holding onto someone  10. Encounter for immunization Flu vaccine

## 2015-08-14 ENCOUNTER — Ambulatory Visit: Payer: PRIVATE HEALTH INSURANCE | Admitting: Pharmacist Clinician (PhC)/ Clinical Pharmacy Specialist

## 2015-09-09 NOTE — Progress Notes (Signed)
Patient has INR drawn at Cardiology

## 2015-09-14 ENCOUNTER — Other Ambulatory Visit: Payer: Self-pay | Admitting: Internal Medicine

## 2015-09-28 ENCOUNTER — Other Ambulatory Visit: Payer: Self-pay | Admitting: Internal Medicine

## 2015-09-30 ENCOUNTER — Other Ambulatory Visit: Payer: Self-pay | Admitting: Ophthalmology

## 2015-09-30 DIAGNOSIS — H53131 Sudden visual loss, right eye: Secondary | ICD-10-CM

## 2015-10-02 ENCOUNTER — Ambulatory Visit: Admission: RE | Admit: 2015-10-02 | Payer: PRIVATE HEALTH INSURANCE | Source: Ambulatory Visit

## 2015-10-02 ENCOUNTER — Ambulatory Visit (INDEPENDENT_AMBULATORY_CARE_PROVIDER_SITE_OTHER): Payer: Medicare Other | Admitting: Pharmacist Clinician (PhC)/ Clinical Pharmacy Specialist

## 2015-10-02 ENCOUNTER — Ambulatory Visit: Payer: PRIVATE HEALTH INSURANCE

## 2015-10-02 DIAGNOSIS — I480 Paroxysmal atrial fibrillation: Secondary | ICD-10-CM | POA: Diagnosis not present

## 2015-10-02 DIAGNOSIS — Z7901 Long term (current) use of anticoagulants: Secondary | ICD-10-CM | POA: Diagnosis not present

## 2015-10-02 DIAGNOSIS — I4891 Unspecified atrial fibrillation: Secondary | ICD-10-CM | POA: Diagnosis not present

## 2015-10-02 LAB — POCT INR: INR: 3

## 2015-10-09 ENCOUNTER — Other Ambulatory Visit: Payer: Self-pay | Admitting: *Deleted

## 2015-10-09 MED ORDER — AMIODARONE HCL 200 MG PO TABS: ORAL_TABLET | ORAL | Status: DC

## 2015-10-09 NOTE — Telephone Encounter (Signed)
[facility] [address] 

## 2015-10-10 ENCOUNTER — Ambulatory Visit (HOSPITAL_COMMUNITY)
Admission: RE | Admit: 2015-10-10 | Discharge: 2015-10-10 | Disposition: A | Discharge status: Home / Self Care | Payer: Medicare Other | Source: Ambulatory Visit | Attending: Nurse Practitioner | Admitting: Nurse Practitioner

## 2015-10-10 ENCOUNTER — Encounter: Payer: Self-pay | Admitting: Nurse Practitioner

## 2015-10-10 ENCOUNTER — Ambulatory Visit (INDEPENDENT_AMBULATORY_CARE_PROVIDER_SITE_OTHER): Payer: Medicare Other | Admitting: Nurse Practitioner

## 2015-10-10 VITALS — BP 130/80 | HR 64 | Temp 98.1°F | Resp 20 | Ht 65.0 in | Wt 128.6 lb

## 2015-10-10 DIAGNOSIS — K5730 Diverticulosis of large intestine without perforation or abscess without bleeding: Secondary | ICD-10-CM | POA: Diagnosis not present

## 2015-10-10 DIAGNOSIS — K869 Disease of pancreas, unspecified: Secondary | ICD-10-CM | POA: Insufficient documentation

## 2015-10-10 DIAGNOSIS — R1084 Generalized abdominal pain: Secondary | ICD-10-CM

## 2015-10-10 DIAGNOSIS — K8020 Calculus of gallbladder without cholecystitis without obstruction: Secondary | ICD-10-CM | POA: Diagnosis not present

## 2015-10-10 DIAGNOSIS — R918 Other nonspecific abnormal finding of lung field: Secondary | ICD-10-CM | POA: Diagnosis not present

## 2015-10-10 DIAGNOSIS — K769 Liver disease, unspecified: Secondary | ICD-10-CM | POA: Diagnosis not present

## 2015-10-10 LAB — POCT URINALYSIS DIPSTICK
Blood, UA: NEGATIVE
Glucose, UA: NEGATIVE
Ketones, UA: NEGATIVE
Leukocytes, UA: NEGATIVE
Nitrite, UA: NEGATIVE
PH UA: 6
SPEC GRAV UA: 1.015
UROBILINOGEN UA: 0.2

## 2015-10-10 LAB — POCT I-STAT CREATININE: CREATININE: 1.1 mg/dL — AB (ref 0.44–1.00)

## 2015-10-10 MED ORDER — IOHEXOL 300 MG/ML  SOLN
100.0000 mL | Freq: Once | INTRAMUSCULAR | Status: AC | PRN
  Administered 2015-10-10: 100 mL via INTRAVENOUS

## 2015-10-10 MED ORDER — IOHEXOL 300 MG/ML  SOLN
50.0000 mL | Freq: Once | INTRAMUSCULAR | Status: AC | PRN
  Administered 2015-10-10: 50 mL via ORAL

## 2015-10-10 NOTE — Patient Instructions (Signed)
Will send urine off for culture.   Will get blood work and get a scan of her abdomen  Increase water intake   If abdominal pains worsen fever occurs to seek immediate medical attention

## 2015-10-10 NOTE — Progress Notes (Signed)
Patient ID: [name], female   DOB: 1925/10/28, 81 y.o.   MRN: [id number]    PCP: Estill Dooms, MD  Advanced Directive information Does patient have an advance directive?: Yes  Allergies  Allergen Reactions  . Ace Inhibitors     Severe Hyperkalemia  . Aldactone [Spironolactone]     Severe hyperkalemia causing junctional bradycardia    Chief Complaint  Patient presents with  . Acute Visit    Patient c/o stomach is hard as a rock and having stomach pain     HPI: Patient is a 81 y.o. female seen in the office today due to stomach issues. Pt with a pmh of afib, DM, htn, hyperlipidemia, memory loss. Pain to left lower abdomen for the last 2-3 days. Caregiver felt like her pants and underwear were too tight so changed her garments but was without relief. No fevers or chills.  Last BM yesterday, have BM daily.  Feels tight in the abdomen. No dysuria. No constipation, no diarrea Unable to give detail regarding the pain due to memory loss.  Keeps pointing to right side but caregivers reports left sided pain is the issue today.   Review of Systems:  Review of Systems  Constitutional: Negative for fever, chills, activity change, appetite change and fatigue.  Respiratory: Negative for cough and shortness of breath.   Cardiovascular: Negative for chest pain and leg swelling.  Gastrointestinal: Positive for abdominal pain. Negative for nausea, vomiting, diarrhea, constipation, blood in stool, abdominal distention and anal bleeding.  Genitourinary: Negative for dysuria, frequency and difficulty urinating.  Psychiatric/Behavioral: Positive for confusion.       Very tearful    Past Medical History  Diagnosis Date  . OSA (obstructive sleep apnea)     AHI-44/hr, AHI during REM 61.8/hr, avg O2 during REM and NREM 96%  . Atrial fibrillation ([place])   . Myocardial infarct ([place])   . Diabetes mellitus   . Hearing loss   . Hypertension   . Hyperlipidemia   . Senile degeneration of  brain   . Other generalized ischemic cerebrovascular disease   . Personal history of fall   . Pain in joint, shoulder region   . 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 Loss of weight   . Chronic kidney disease, stage II (mild)   . Obstructive sleep apnea (adult) (pediatric)   . Senile degeneration of brain   . Dizziness and giddiness   . Personal history of noncompliance with medical treatment, presenting hazards to health   . Dyskinesia of esophagus   . Shortness of breath   . Chest pain, unspecified 06/19/2009  . Long term (current) use of anticoagulants   . Unspecified hereditary and idiopathic peripheral neuropathy   . Pain in joint, site unspecified   . Carpal tunnel syndrome   . Other hammer toe (acquired)   . Alopecia areata   . Dysphagia, unspecified(787.20)   . Occlusion and stenosis of carotid artery without mention of cerebral infarction   . Other symptoms involving cardiovascular system   . Other acne   . Allergic rhinitis, cause unspecified   . Type II or unspecified type diabetes mellitus without mention of complication, uncontrolled   . Varicose veins of lower extremities   . Cervicalgia   . Disturbance of salivary secretion   . Insomnia, unspecified   . Unspecified hypothyroidism   . Palpitations 715.90  . Other malaise and fatigue   . Diverticulosis of colon (without mention of hemorrhage)   .  Benign neoplasm of colon   . Depressive disorder, not elsewhere classified   . Syncope and collapse   . Paroxysmal atrial fibrillation ([facility])   . Hypertension   . Dizziness    Past Surgical History  Procedure Laterality Date  . Total abdominal hysterectomy    . Tonsillectomy    . Appendectomy    . Hand surgery    . Cataract od  10/04/2012  . Cataract os  01/31/2013  . Carotid doppler  07/25/2012    Bilat internal carotid arteries demonstrate 1-395 stenoses.  . Transthoracic echocardiogram  04/24/2011    EF >55%, moderate tricuspid regurg.     Social History:   reports that she has never smoked. She has never used smokeless tobacco. She reports that she does not drink alcohol or use illicit drugs.  Family History  Problem Relation Age of Onset  . Cervical cancer Mother   . Cancer Mother   . Parkinsonism Father   . Sleep apnea Brother   . Diabetes Brother   . Sleep apnea Brother   . Peripheral vascular disease Brother   . Cancer Daughter     Medications: Patient's Medications  New Prescriptions   No medications on file  Previous Medications   AMIODARONE (PACERONE) 200 MG TABLET    Take one tablet by mouth once daily   AMLODIPINE (NORVASC) 5 MG TABLET    Take one tablet by mouth once daily to control blood pressure   CALCIUM CARBONATE (TUMS - DOSED IN MG ELEMENTAL CALCIUM) 500 MG CHEWABLE TABLET    Chew 2 tablets by mouth daily as needed for indigestion or heartburn.   ESTRADIOL (ESTRACE) 0.5 MG TABLET    TAKE 1 TABLET EVERY DAY FOR HORMONES   FEEDING SUPPLEMENT, ENSURE ENLIVE, (ENSURE ENLIVE) LIQD    Take 237 mLs by mouth 2 (two) times daily between meals.   HYDROXYPROPYL METHYLCELLULOSE (ISOPTO TEARS) 2.5 % OPHTHALMIC SOLUTION    Place 1 drop into both eyes as needed for dry eyes.   LEVOTHYROXINE (SYNTHROID, LEVOTHROID) 100 MCG TABLET    Take 1 tablet (100 mcg total) by mouth daily.   LISINOPRIL (PRINIVIL,ZESTRIL) 5 MG TABLET    TAKE 1 TABLET BY MOUTH EVERY DAY   WARFARIN (COUMADIN) 5 MG TABLET    Take 1 tablets by mouth daily as directed by coumadin clinic  Modified Medications   No medications on file  Discontinued Medications   VITAMINS A & D (SWEEN 24) CREA    Apply 1 application topically daily.     Physical Exam:  Filed Vitals:   10/10/15 1259  BP: 130/80  Pulse: 64  Temp: 98.1 F (36.7 C)  TempSrc: Oral  Resp: 20  Height: 5\' 5"  (1.651 m)  Weight: 128 lb 9.6 oz (58.333 kg)  SpO2: 97%   Body mass index is 21.4 kg/(m^2).  Physical Exam  Constitutional: She appears well-developed and  well-nourished.  Cardiovascular: Normal rate, regular rhythm and normal heart sounds.   Pulmonary/Chest: Effort normal and breath sounds normal. No respiratory distress.  Abdominal: Soft. Bowel sounds are normal. She exhibits no distension and no mass. There is tenderness (throughout abdomen). There is guarding. There is no rebound.  Neurological: She is alert.  Oriented to self   Psychiatric: Her mood appears anxious.  Tearful during exam    Labs reviewed: Basic Metabolic Panel:  Recent Labs  05/23/15 0420 05/24/15 0400  05/27/15 0433  06/06/15 0247  06/09/15 0408 06/11/15 1709 08/08/15 0952  NA 136 134*  < >  --   < >  138  < > 139 140 140  K 3.7 3.1*  < >  --   < > 4.0  < > 3.4* 3.9 3.9  CL 95* 96*  < >  --   < > 105  < > 104 101 97  CO2 23 20*  < >  --   < > 23  < > 23 22 23   GLUCOSE 146* 172*  < >  --   < > 143*  < > 115* 133* 106*  BUN 33* 36*  < >  --   < > 17  < > 11 15 25   CREATININE 4.28* 4.15*  < >  --   < > 1.74*  < > 1.18* 1.06* 1.02*  CALCIUM 8.0* 8.3*  < >  --   < > 8.4*  < > 8.8* 9.5 9.9  MG 1.6* 2.0  --   --   --  1.2*  --   --   --   --   PHOS 4.8* 4.1  --   --   --   --   --   --   --   --   TSH  --   --   --  6.047*  --   --   --   --  10.340* 1.810  < > = values in this interval not displayed. Liver Function Tests:  Recent Labs  06/09/15 0408 06/11/15 1709 08/08/15 0952  AST 50* 29 31  ALT 207* 130* 27  ALKPHOS 252* 280* 154*  BILITOT 0.9 0.7 <0.2  PROT 5.7* 7.1 7.1  ALBUMIN 2.8* 4.3 4.2    Recent Labs  05/21/15 0420 06/05/15 0427  LIPASE 49 97*    Recent Labs  05/21/15 0948  AMMONIA 26   CBC:  Recent Labs  05/20/15 2300  06/05/15 0427  06/06/15 0247 06/07/15 0235 06/08/15 0313 06/11/15 1709  WBC 12.7*  < > 13.0*  --  13.8* 10.3 8.1 9.4  NEUTROABS 8.9*  --  9.4*  --   --   --   --  5.8  HGB 10.8*  < > 10.3*  < > 9.6* 9.3* 10.0*  --   HCT 30.9*  < > 31.7*  < > 27.8* 27.6* 29.9* 35.3  MCV 95.1  < > 103.6*  --  97.5 97.2 97.1   --   PLT 265  < > 162  --  197 194 219  --   < > = values in this interval not displayed. Lipid Panel: No results for input(s): CHOL, HDL, LDLCALC, TRIG, CHOLHDL, LDLDIRECT in the last 8760 hours. TSH:  Recent Labs  05/27/15 0433 06/11/15 1709 08/08/15 0952  TSH 6.047* 10.340* 1.810   A1C: Lab Results  Component Value Date   HGBA1C 6.6* 08/08/2015     Assessment/Plan 1. Generalized abdominal pain -pt unable to give a lot of detail due to memory loss. Pain appears to be off and on for 2 days. No fevers. Diffuse abdominal pain with light touch.  Eating and drinking well at this time -will obtain lab work as below and CT scan of abdomen  - CBC with Differential - Comprehensive metabolic panel - Amylase - Lipase - UA negative but will send for Culture, Urine - CT Abd Wo & W Cm -return precautions discussed in detail with pt and caregiver   Carlos American. Harle Battiest  [facility] [phone] 8 am - 5 pm) [phone] (after hours)

## 2015-10-11 ENCOUNTER — Telehealth: Payer: Self-pay

## 2015-10-11 ENCOUNTER — Other Ambulatory Visit: Payer: PRIVATE HEALTH INSURANCE

## 2015-10-11 ENCOUNTER — Inpatient Hospital Stay: Admission: RE | Admit: 2015-10-11 | Payer: PRIVATE HEALTH INSURANCE | Source: Ambulatory Visit

## 2015-10-11 ENCOUNTER — Other Ambulatory Visit: Payer: Self-pay | Admitting: *Deleted

## 2015-10-11 ENCOUNTER — Telehealth: Payer: Self-pay | Admitting: *Deleted

## 2015-10-11 LAB — CBC WITH DIFFERENTIAL/PLATELET
BASOS: 1 %
Basophils Absolute: 0.1 10*3/uL (ref 0.0–0.2)
EOS (ABSOLUTE): 0.1 10*3/uL (ref 0.0–0.4)
EOS: 2 %
HEMATOCRIT: 36.1 % (ref 34.0–46.6)
HEMOGLOBIN: 11.9 g/dL (ref 11.1–15.9)
Immature Grans (Abs): 0 10*3/uL (ref 0.0–0.1)
Immature Granulocytes: 0 %
LYMPHS ABS: 1.4 10*3/uL (ref 0.7–3.1)
Lymphs: 22 %
MCH: 31.2 pg (ref 26.6–33.0)
MCHC: 33 g/dL (ref 31.5–35.7)
MCV: 95 fL (ref 79–97)
MONOCYTES: 13 %
Monocytes Absolute: 0.8 10*3/uL (ref 0.1–0.9)
Neutrophils Absolute: 4 10*3/uL (ref 1.4–7.0)
Neutrophils: 62 %
Platelets: 346 10*3/uL (ref 150–379)
RBC: 3.81 x10E6/uL (ref 3.77–5.28)
RDW: 13.7 % (ref 12.3–15.4)
WBC: 6.4 10*3/uL (ref 3.4–10.8)

## 2015-10-11 LAB — COMPREHENSIVE METABOLIC PANEL
A/G RATIO: 1.8 (ref 1.1–2.5)
ALBUMIN: 4.5 g/dL (ref 3.5–4.7)
ALK PHOS: 127 IU/L — AB (ref 39–117)
ALT: 32 IU/L (ref 0–32)
AST: 29 IU/L (ref 0–40)
BUN / CREAT RATIO: 19 (ref 11–26)
BUN: 22 mg/dL (ref 8–27)
Bilirubin Total: 0.4 mg/dL (ref 0.0–1.2)
CO2: 24 mmol/L (ref 18–29)
CREATININE: 1.16 mg/dL — AB (ref 0.57–1.00)
Calcium: 9.9 mg/dL (ref 8.7–10.3)
Chloride: 96 mmol/L — ABNORMAL LOW (ref 97–106)
GFR calc Af Amer: 49 mL/min/{1.73_m2} — ABNORMAL LOW (ref >59)
GFR calc non Af Amer: 42 mL/min/{1.73_m2} — ABNORMAL LOW (ref >59)
GLOBULIN, TOTAL: 2.5 g/dL (ref 1.5–4.5)
Glucose: 232 mg/dL — ABNORMAL HIGH (ref 65–99)
POTASSIUM: 3.8 mmol/L (ref 3.5–5.2)
Sodium: 137 mmol/L (ref 136–144)
Total Protein: 7 g/dL (ref 6.0–8.5)

## 2015-10-11 LAB — LIPASE: LIPASE: 45 U/L (ref 0–59)

## 2015-10-11 LAB — AMYLASE: AMYLASE: 79 U/L (ref 31–124)

## 2015-10-11 MED ORDER — AMIODARONE HCL 200 MG PO TABS: ORAL_TABLET | ORAL | Status: AC

## 2015-10-11 NOTE — Telephone Encounter (Signed)
Patients daughter -in-law called office informed her of the results wanted to have letters mailed letters printed.

## 2015-10-11 NOTE — Telephone Encounter (Signed)
[facility] 

## 2015-10-11 NOTE — Telephone Encounter (Signed)
CT Abdomen Pelvis W Contrast   Status: Final result       PACS Images     Show images for CT Abdomen Pelvis W Contrast     Study Result     CLINICAL DATA: Generalized abdominal pain  EXAM: CT ABDOMEN AND PELVIS WITH CONTRAST  TECHNIQUE: Multidetector CT imaging of the abdomen and pelvis was performed using the standard protocol following bolus administration of intravenous contrast. Oral contrast was also administered.  CONTRAST: 139mL OMNIPAQUE IOHEXOL 300 MG/ML SOLN  COMPARISON: June 05, 2015 and May 21, 2015  FINDINGS: Lower chest: There is atelectatic change in the left base. 7 mm nodular opacity in the lateral segment of the right lower lobe seen on axial slice 6 series 8. There is a nearby 6 mm nodular opacity in the lateral segment of the right lower lobe seen on axial slice 5 series 8. Note that these areas were obscured on prior studies due to pleural effusions.  Hepatobiliary: On initial venous phase imaging, there is motion artifact in the upper abdomen. There is a vague area of enhancement in the lateral segment left lobe of the liver measuring 1.2 x 0.8 cm which is not appreciable on delayed phase imaging. No other focal liver lesions are identified. There are gallstones within the gallbladder, noted previously. The gallbladder wall does not appear appreciably thickened. The common bile duct measures 9 mm, mildly prominent. There is no appreciable intrahepatic biliary duct dilatation.  Pancreas: There is mild dilatation of the pancreatic duct. There is vague fullness in the head of the pancreas measuring 1.3 x 1.3 cm, best seen on the initial venous phase imaging on axial slice 32 series 2. This area shows equivocal enhancement on the venous phase imaging with no enhancement in this area on more delayed phase imaging. No other findings concerning for pancreatic mass. There is no inflammatory focus appreciable.  Spleen: No splenic  lesions are appreciable.  Adrenals/Urinary Tract: Mild adrenal hypertrophy is stable. There is no renal mass or hydronephrosis on either side. No renal or ureteral calculus is appreciable. Urinary bladder is midline with normal wall thickness.  Stomach/Bowel: Rectum is distended with fluid and stool. There are multiple sigmoid diverticula without diverticulitis. There is fairly diffuse stool throughout most of the colon. No bowel obstruction or bowel wall thickening. No free air or portal venous air is appreciable.  Vascular/Lymphatic: There is atherosclerotic calcification in the aorta and iliac arteries. No aneurysm appreciable. No adenopathy is identified in the abdomen or pelvis.  Reproductive: Uterus is absent. No pelvic mass or pelvic fluid collection.  Other: Appendix absent. No periappendiceal region inflammation. No abscess or ascites in the abdomen or pelvis.  Musculoskeletal: There is degenerative change in the thoracic spine. No blastic or lytic bone lesions. No intramuscular abdominal wall lesions.  IMPRESSION: There is slight prominence of the distal common bile duct and pancreatic duct. In the head of the pancreas, there is a rather subtle area of slight soft tissue fullness measuring 1.3 x 1.3 cm, appreciable only on the venous phase imaging. A degree of motion artifact in this area does make evaluation of this area somewhat less than optimal. The possibility of a small pancreatic head mass must be of concern. Precontrast and postcontrast imaging of the pancreas by MR would be the optimal study of choice to further evaluate, if patient can tolerate and avoid motion.  Cholelithiasis is again noted.  Nodular opacities in the right base region, largest measuring 7 mm. Followup of these  nodular opacity should be based on Fleischner Society guidelines. If the patient is at high risk for bronchogenic carcinoma, follow-up chest CT at 3-62months is  recommended. If the patient is at low risk for bronchogenic carcinoma, follow-up chest CT at 6-12 months is recommended. This recommendation follows the consensus statement: Guidelines for Management of Small Pulmonary Nodules Detected on CT Scans: A Statement from the [place] as published in Radiology 2005; 237:395-400.  Small lesion in the left lobe of the liver seen on the venous phase examination without visualization on delayed phase imaging. Suspect small hemangioma. Particular attention this area on MR evaluation would be advised.  Extensive stool throughout the rectum. Rectum distended with stool and fluid. No bowel obstruction. No bowel wall thickening. There is extensive sigmoid diverticulosis without diverticulitis.  Uterus absent. Appendix absent.  These results will be called to the ordering clinician or representative by the Radiologist Assistant, and communication documented in the PACS or zVision Dashboard.   Electronically Signed  By: Lowella Grip III M.D.  On: 10/11/2015 08:50           Vitals     Height Weight BMI (Calculated)    5\' 5"  (1.651 m) 128 lb 9.6 oz (58.333 kg) 21.4      Interpretation Summary     CLINICAL DATA: Generalized abdominal pain  EXAM: CT ABDOMEN AND PELVIS WITH CONTRAST  TECHNIQUE: Multidetector CT imaging of the abdomen and pelvis was performed using the standard protocol following bolus administration of intravenous contrast. Oral contrast was also administered.  CONTRAST: 143mL OMNIPAQUE IOHEXOL 300 MG/ML SOLN  COMPARISON: June 05, 2015 and May 21, 2015  FINDINGS: Lower chest: There is atelectatic change in the left base. 7 mm nodular opacity in the lateral segment of the right lower lobe seen on axial slice 6 series 8. There is a nearby 6 mm nodular opacity in the lateral segment of the right lower lobe seen on axial slice 5 series 8. Note that these areas were obscured  on prior studies due to pleural effusions.  Hepatobiliary: On initial venous phase imaging, there is motion artifact in the upper abdomen. There is a vague area of enhancement in the lateral segment left lobe of the liver measuring 1.2 x 0.8 cm which is not appreciable on delayed phase imaging. No other focal liver lesions are identified. There are gallstones within the gallbladder, noted previously. The gallbladder wall does not appear appreciably thickened. The common bile duct measures 9 mm, mildly prominent. There is no appreciable intrahepatic biliary duct dilatation.  Pancreas: There is mild dilatation of the pancreatic duct. There is vague fullness in the head of the pancreas measuring 1.3 x 1.3 cm, best seen on the initial venous phase imaging on axial slice 32 series 2. This area shows equivocal enhancement on the venous phase imaging with no enhancement in this area on more delayed phase imaging. No other findings concerning for pancreatic mass. There is no inflammatory focus appreciable.  Spleen: No splenic lesions are appreciable.  Adrenals/Urinary Tract: Mild adrenal hypertrophy is stable. There is no renal mass or hydronephrosis on either side. No renal or ureteral calculus is appreciable. Urinary bladder is midline with normal wall thickness.  Stomach/Bowel: Rectum is distended with fluid and stool. There are multiple sigmoid diverticula without diverticulitis. There is fairly diffuse stool throughout most of the colon. No bowel obstruction or bowel wall thickening. No free air or portal venous air is appreciable.  Vascular/Lymphatic: There is atherosclerotic calcification in the aorta and iliac  arteries. No aneurysm appreciable. No adenopathy is identified in the abdomen or pelvis.  Reproductive: Uterus is absent. No pelvic mass or pelvic fluid collection.  Other: Appendix absent. No periappendiceal region inflammation. No abscess or ascites in the  abdomen or pelvis.  Musculoskeletal: There is degenerative change in the thoracic spine. No blastic or lytic bone lesions. No intramuscular abdominal wall lesions.  IMPRESSION: There is slight prominence of the distal common bile duct and pancreatic duct. In the head of the pancreas, there is a rather subtle area of slight soft tissue fullness measuring 1.3 x 1.3 cm, appreciable only on the venous phase imaging. A degree of motion artifact in this area does make evaluation of this area somewhat less than optimal. The possibility of a small pancreatic head mass must be of concern. Precontrast and postcontrast imaging of the pancreas by MR would be the optimal study of choice to further evaluate, if patient can tolerate and avoid motion.  Cholelithiasis is again noted.  Nodular opacities in the right base region, largest measuring 7 mm. Followup of these nodular opacity should be based on Fleischner Society guidelines. If the patient is at high risk for bronchogenic carcinoma, follow-up chest CT at 3-44months is recommended. If the patient is at low risk for bronchogenic carcinoma, follow-up chest CT at 6-12 months is recommended. This recommendation follows the consensus statement: Guidelines for Management of Small Pulmonary Nodules Detected on CT Scans: A Statement from the [employer] as published in Radiology 2005; 237:395-400.  Small lesion in the left lobe of the liver seen on the venous phase examination without visualization on delayed phase imaging. Suspect small hemangioma. Particular attention this area on MR evaluation would be advised.  Extensive stool throughout the rectum. Rectum distended with stool and fluid. No bowel obstruction. No bowel wall thickening. There is extensive sigmoid diverticulosis without diverticulitis.  Uterus absent. Appendix absent.  These results will be called to the ordering clinician or representative by the Radiologist  Assistant, and communication documented in the PACS or zVision Dashboard.   Electronically Signed  By: Lowella Grip III M.D.  On: 10/11/2015 08:50      External Result Report     External Result Report     Imaging     Imaging Information     Signed by     Signed Date/Time   Phone Pager    Erling Conte 10/11/2015 8:50 AM [phone]       Exam Information     Status Exam Begun   Exam Ended      Final [99] 10/10/2015 6:20 PM 10/10/2015 6:46 PM      Signed     Electronically signed by Lowella Grip III, MD on 10/11/15 at 0850 EDT     Imaging Related Medications     Medication    iohexol (OMNIPAQUE) 300 MG/ML solution 100 mL    Route: Intravenous    Admin Amount: 100 mL    Volume: 100 mL    PRN Reason(s): contrast    Last Admin Time: 10/10/15 1826    Number of Expected Doses: 1      Most Recent Administration:    User Action Time Recorded Time Dose Route Site Comment Action Reason    Derryl [place], Rad Tech 10/10/15 1826 10/10/15 1826 100 mL Intravenous   Contrast Given     Full Administration Report                  Original Order  Ordered On Ordered By      10/10/2015 2:24 PM Oralia Manis, CMA             PACS Images     Show images for CT Abdomen Pelvis W Contrast

## 2015-10-12 LAB — URINE CULTURE

## 2015-10-16 ENCOUNTER — Ambulatory Visit: Payer: PRIVATE HEALTH INSURANCE | Admitting: Pharmacist Clinician (PhC)/ Clinical Pharmacy Specialist

## 2015-10-25 ENCOUNTER — Telehealth: Payer: Self-pay

## 2015-10-25 NOTE — Telephone Encounter (Signed)
Patient's daughter called back- patient also with cough and runny nose x 1 week. Today patient with dark red blood in phlegm Please advise on this concern as well

## 2015-10-25 NOTE — Telephone Encounter (Signed)
Patient's daughter called and left message on triage voicemail requesting sleep aid, patient is getting up at different times of the night and tired all day.  Please advise

## 2015-10-28 NOTE — Telephone Encounter (Signed)
Patient needs chest xray and appointment.

## 2015-10-28 NOTE — Telephone Encounter (Signed)
Called to try to set up appointment for patient with daughter, but patient answered the phone was a little confused by my call told her to have her daughter to call the office .

## 2015-10-28 NOTE — Telephone Encounter (Signed)
Message forwarded to medical assistants in office, please address

## 2015-11-06 ENCOUNTER — Ambulatory Visit (INDEPENDENT_AMBULATORY_CARE_PROVIDER_SITE_OTHER): Payer: Medicare Other | Admitting: Internal Medicine

## 2015-11-06 ENCOUNTER — Encounter: Payer: Self-pay | Admitting: Internal Medicine

## 2015-11-06 VITALS — BP 150/64 | HR 76 | Temp 98.2°F | Resp 20 | Ht 65.0 in | Wt 130.0 lb

## 2015-11-06 DIAGNOSIS — E038 Other specified hypothyroidism: Secondary | ICD-10-CM

## 2015-11-06 DIAGNOSIS — R413 Other amnesia: Secondary | ICD-10-CM | POA: Diagnosis not present

## 2015-11-06 DIAGNOSIS — F22 Delusional disorders: Secondary | ICD-10-CM | POA: Insufficient documentation

## 2015-11-06 DIAGNOSIS — I10 Essential (primary) hypertension: Secondary | ICD-10-CM | POA: Diagnosis not present

## 2015-11-06 DIAGNOSIS — E1165 Type 2 diabetes mellitus with hyperglycemia: Secondary | ICD-10-CM | POA: Diagnosis not present

## 2015-11-06 DIAGNOSIS — R443 Hallucinations, unspecified: Secondary | ICD-10-CM | POA: Diagnosis not present

## 2015-11-06 MED ORDER — RISPERIDONE 1 MG PO TABS: ORAL_TABLET | ORAL | Status: AC

## 2015-11-06 MED ORDER — METFORMIN HCL 500 MG PO TABS: ORAL_TABLET | ORAL | Status: AC

## 2015-11-06 NOTE — Patient Instructions (Signed)
Your hernia does not need to be repaired. It is not a risk to your health at this time. Myoflex or Aspercream my help your knee.

## 2015-11-06 NOTE — Progress Notes (Signed)
Patient ID: [name], female   DOB: [birth year], 81 y.o.   MRN: [id number]    Facility  [facility]    Place of Service:   OFFICE    Allergies  Allergen Reactions  . Ace Inhibitors     Severe Hyperkalemia  . Aldactone [Spironolactone]     Severe hyperkalemia causing junctional bradycardia    Chief Complaint  Patient presents with  . Acute Visit    3-4 weeks, staying up all night and sleeping all day.    HPI:   patient is accompanied by her sons wife he has been with her for the last several visits. Her son has never come with her to the office. Her daughter-in-law is clearly at her limits regarding continued care of this patient. The patient has been rude, insulting, and increasingly dependent on others to meet her activities of daily living. She is now hallucinating. Behavior is extremely inappropriate. She says it is because she cannot see well. Daughter-in-law states the patient has been flushing trashed on the toilet and sometimes prickles trashing her food. She asked the daughter-in-law "satellite use the bathroom" sometimes she sits on the trash can. Patient has become obsessive and other items. She is repetitive in her verbalizations regarding her hernia and feels that "something must be done". She is changing closing on hours. She has nocturia once every 10 minutes" according to her daughter-in-law. She gets up at 8 AM and sleeps all day. All symptoms seem to be accelerating over the last 4 weeks. She has not been febrile or ill in any other discernible way.   Patient complains of having a "man in her bedroom". She believes that this man is poking at her anus. There are accusations of stealing directed to her daughter-in-law. Patient's daughter-in-law has assisted from time to time in care of this woman. Other family members seem to contribute very little per today's history.  Type 2 diabetes mellitus with hyperglycemia, without long-term current use of insulin (HCC) -  Seems  controlled  Essential hypertension -  controlled  Other specified hypothyroidism -  compensated  Memory deficit -  unchanged    Medications: Patient's Medications  New Prescriptions   No medications on file  Previous Medications   AMIODARONE (PACERONE) 200 MG TABLET    Take one tablet by mouth once daily   AMLODIPINE (NORVASC) 5 MG TABLET    Take one tablet by mouth once daily to control blood pressure   CALCIUM CARBONATE (TUMS - DOSED IN MG ELEMENTAL CALCIUM) 500 MG CHEWABLE TABLET    Chew 2 tablets by mouth daily as needed for indigestion or heartburn.   ESTRADIOL (ESTRACE) 0.5 MG TABLET    TAKE 1 TABLET EVERY DAY FOR HORMONES   FEEDING SUPPLEMENT, ENSURE ENLIVE, (ENSURE ENLIVE) LIQD    Take 237 mLs by mouth 2 (two) times daily between meals.   HYDROXYPROPYL METHYLCELLULOSE (ISOPTO TEARS) 2.5 % OPHTHALMIC SOLUTION    Place 1 drop into both eyes as needed for dry eyes.   LEVOTHYROXINE (SYNTHROID, LEVOTHROID) 100 MCG TABLET    Take 1 tablet (100 mcg total) by mouth daily.   LISINOPRIL (PRINIVIL,ZESTRIL) 5 MG TABLET    TAKE 1 TABLET BY MOUTH EVERY DAY   WARFARIN (COUMADIN) 5 MG TABLET    Take 1 tablets by mouth daily as directed by coumadin clinic  Modified Medications   No medications on file  Discontinued Medications   No medications on file    Review of Systems  Constitutional: Positive  for fatigue.  Eyes: Negative.   Respiratory: Positive for shortness of breath.   Cardiovascular: Positive for palpitations. Negative for chest pain and leg swelling.  Gastrointestinal: Positive for diarrhea. Negative for nausea, abdominal pain and abdominal distention.       Incontinent of stool sometimes  Endocrine:       History of thyroid problems and diabetes.  Genitourinary: Negative.        Diarrhea. No cramps. No blood in stool. Incontinent of urine.  Musculoskeletal: Positive for arthralgias and gait problem.       Sometimes she feels like she could fall over when she walks through  her yard.  Neurological: Positive for weakness.       Memory deficits.  Hematological: Negative.   Psychiatric/Behavioral: Positive for hallucinations, behavioral problems, confusion, sleep disturbance, dysphoric mood, decreased concentration and agitation. Negative for suicidal ideas and self-injury. The patient is nervous/anxious.     Filed Vitals:   11/06/15 1523  BP: 150/64  Pulse: 76  Temp: 98.2 F (36.8 C)  TempSrc: Oral  Resp: 20  Height: 5' 5"  (1.651 m)  Weight: 130 lb (58.968 kg)  SpO2: 97%   Body mass index is 21.63 kg/(m^2).  Physical Exam  Constitutional: She is oriented to person, place, and time. No distress.  Frail elderly female Possible 11 pound weight loss since late June 2016  HENT:  Head: Normocephalic and atraumatic.  Nose: Nose normal.  Mouth/Throat: No oropharyngeal exudate.  Eyes: Conjunctivae and EOM are normal. Pupils are equal, round, and reactive to light.  Prescription lenses  Neck: Normal range of motion. Neck supple. No JVD present. No tracheal deviation present. No thyromegaly present.  Chronically hoarse.  Cardiovascular: Normal rate, regular rhythm, normal heart sounds and intact distal pulses.  Exam reveals no gallop and no friction rub.   No murmur heard. Pulmonary/Chest: Effort normal and breath sounds normal. No respiratory distress. She has no wheezes. She has no rales.  Abdominal: Soft. Bowel sounds are normal. She exhibits no distension and no mass. There is no tenderness.  Musculoskeletal: She exhibits no tenderness.  Reduced extension and flexion of the right knee. Heberden's nodes of the hands. Hammer toe on the right foot 2nd toe. No focal back pain on percussion and oalpation. Left arm swollen and bruised appearing. It is tender to touch. There is mild increase in surface temperature in the area of the left hand and wrist.  Lymphadenopathy:    She has no cervical adenopathy.  Neurological: She is alert and oriented to person,  place, and time. No cranial nerve deficit.  Slow and sometimes vague responses to questions. Impaired short-term recall. Diminished vibratory sensation in the great toes. Repetitively. Confused.  Skin: Skin is warm and dry. No rash noted. No erythema. No pallor.  Psychiatric:  Emotionally labile. Distraught over her inability to answer questions on Mini-Mental status exam.    Labs reviewed: Lab Summary Latest Ref Rng 10/10/2015 10/10/2015 08/08/2015 06/11/2015  Hemoglobin 11.1 - 15.9 g/dL (None) 11.9 (None) 12.2  Hematocrit 34.0 - 46.6 % (None) 36.1 (None) 35.3  White count 3.4 - 10.8 x10E3/uL (None) 6.4 (None) 9.4  Platelet count 150 - 379 x10E3/uL (None) 346 (None) (None)  Sodium 136 - 144 mmol/L (None) 137 140 140  Potassium 3.5 - 5.2 mmol/L (None) 3.8 3.9 3.9  Calcium 8.7 - 10.3 mg/dL (None) 9.9 9.9 9.5  Phosphorus - (None) (None) (None) (None)  Creatinine 0.44 - 1.00 mg/dL 1.10(H) 1.16(H) 1.02(H) 1.06(H)  AST 0 - 40  IU/L (None) 29 31 29   Alk Phos 39 - 117 IU/L (None) 127(H) 154(H) 280(H)  Bilirubin 0.0 - 1.2 mg/dL (None) 0.4 <0.2 0.7  Glucose 65 - 99 mg/dL (None) 232(H) 106(H) 133(H)  Cholesterol - (None) (None) (None) (None)  HDL cholesterol - (None) (None) (None) (None)  Triglycerides - (None) (None) (None) (None)  LDL Direct - (None) (None) (None) (None)  LDL Calc - (None) (None) (None) (None)  Total protein - (None) (None) (None) (None)  Albumin 3.5 - 4.7 g/dL (None) 4.5 4.2 4.3   Lab Results  Component Value Date   TSH 1.810 08/08/2015   Lab Results  Component Value Date   BUN 22 10/10/2015   Lab Results  Component Value Date   HGBA1C 6.6* 08/08/2015    Assessment/Plan  Paranoia ([place]) - Plan: risperiDONE (RISPERDAL) 1 MG tablet  Hallucinations - Plan: risperiDONE (RISPERDAL) 1 MG tablet  Type 2 diabetes mellitus with hyperglycemia, without long-term current use of insulin (HCC) - Plan: metFORMIN (GLUCOPHAGE) 500 MG tablet  Essential hypertension -  controlled  Other specified hypothyroidism - compensated  Memory deficit - worsening

## 2015-11-11 ENCOUNTER — Other Ambulatory Visit: Payer: Medicare Other

## 2015-11-13 ENCOUNTER — Encounter: Payer: Self-pay | Admitting: Internal Medicine

## 2015-11-13 ENCOUNTER — Ambulatory Visit: Payer: PRIVATE HEALTH INSURANCE | Admitting: Internal Medicine

## 2015-11-22 LAB — CBC AND DIFFERENTIAL
HCT: 35 % — AB (ref 36–46)
HEMOGLOBIN: 11.8 g/dL — AB (ref 12.0–16.0)
Platelets: 304 10*3/uL (ref 150–399)
WBC: 9.8 10*3/mL

## 2015-11-22 LAB — POCT ERYTHROCYTE SEDIMENTATION RATE, NON-AUTOMATED: Sed Rate: 22 mm

## 2015-12-02 ENCOUNTER — Other Ambulatory Visit: Payer: Self-pay | Admitting: Internal Medicine

## 2015-12-03 ENCOUNTER — Other Ambulatory Visit: Payer: PRIVATE HEALTH INSURANCE

## 2015-12-03 ENCOUNTER — Other Ambulatory Visit: Payer: Medicare Other

## 2015-12-04 NOTE — Telephone Encounter (Signed)
Patient was seen in office on 11/06/15

## 2015-12-13 ENCOUNTER — Ambulatory Visit
Admission: RE | Admit: 2015-12-13 | Discharge: 2015-12-13 | Disposition: A | Discharge status: Home / Self Care | Payer: Medicare Other | Source: Ambulatory Visit | Attending: Ophthalmology | Admitting: Ophthalmology

## 2015-12-13 DIAGNOSIS — H53131 Sudden visual loss, right eye: Secondary | ICD-10-CM

## 2015-12-13 MED ORDER — GADOBENATE DIMEGLUMINE 529 MG/ML IV SOLN
11.0000 mL | Freq: Once | INTRAVENOUS | Status: AC | PRN
  Administered 2015-12-13: 11 mL via INTRAVENOUS

## 2015-12-23 ENCOUNTER — Telehealth: Payer: Self-pay | Admitting: *Deleted

## 2015-12-23 ENCOUNTER — Telehealth: Payer: Self-pay

## 2015-12-23 ENCOUNTER — Other Ambulatory Visit: Payer: Self-pay | Admitting: Cardiovascular Disease

## 2015-12-23 DIAGNOSIS — IMO0002 Reserved for concepts with insufficient information to code with codable children: Secondary | ICD-10-CM

## 2015-12-23 DIAGNOSIS — E1169 Type 2 diabetes mellitus with other specified complication: Secondary | ICD-10-CM

## 2015-12-23 DIAGNOSIS — I10 Essential (primary) hypertension: Secondary | ICD-10-CM

## 2015-12-23 DIAGNOSIS — E1165 Type 2 diabetes mellitus with hyperglycemia: Secondary | ICD-10-CM

## 2015-12-23 DIAGNOSIS — I480 Paroxysmal atrial fibrillation: Secondary | ICD-10-CM

## 2015-12-23 NOTE — Telephone Encounter (Signed)
Daughter in [place] called and stated that Dr. Nyoka Cowden prescribed Risperdone. It is working to keep patient calm but patient is not eating well and has a hard time getting up and blinks eye all the time. Daughter in law thinks it is a side effect and wants to stop it. Took her to the eye Dr a couple days ago and they could not scan her eyes due to the constant blinking. Please Advise.

## 2015-12-23 NOTE — Telephone Encounter (Signed)
Stop the risperidone.

## 2015-12-23 NOTE — Telephone Encounter (Signed)
Daughter in [place] notified and agreed.

## 2015-12-24 NOTE — Telephone Encounter (Signed)
Dr. Nyoka Cowden, Dr. Valetta Close called to ask if you could order these scans for the patient as her next appointment is to far off and he feels as though she should have them done sooner. The scans are Carotid Doppler and Thoracic  [place] Cardiogram. A copy of his lab results and MRI of the head/orbits was placed in your review and sign folder.   Please advise

## 2015-12-24 NOTE — Telephone Encounter (Signed)
Order 2D  thoracic echocardiogram and carotid Doppler as requested.

## 2015-12-25 ENCOUNTER — Other Ambulatory Visit: Payer: Self-pay

## 2015-12-25 NOTE — Telephone Encounter (Signed)
Spoke with Anderson Malta at [facility]. Order placed.

## 2015-12-30 ENCOUNTER — Encounter: Payer: Medicare Other | Admitting: Pharmacist Clinician (PhC)/ Clinical Pharmacy Specialist

## 2015-12-31 ENCOUNTER — Encounter (HOSPITAL_COMMUNITY): Payer: Self-pay | Admitting: Emergency Medicine

## 2015-12-31 ENCOUNTER — Telehealth: Payer: Self-pay | Admitting: Internal Medicine

## 2015-12-31 ENCOUNTER — Emergency Department (HOSPITAL_COMMUNITY)
Admission: EM | Admit: 2015-12-31 | Discharge: 2016-01-08 | Disposition: E | Payer: Medicare Other | Attending: Emergency Medicine | Admitting: Emergency Medicine

## 2015-12-31 DIAGNOSIS — Z7984 Long term (current) use of oral hypoglycemic drugs: Secondary | ICD-10-CM | POA: Insufficient documentation

## 2015-12-31 DIAGNOSIS — I252 Old myocardial infarction: Secondary | ICD-10-CM | POA: Diagnosis not present

## 2015-12-31 DIAGNOSIS — Z79899 Other long term (current) drug therapy: Secondary | ICD-10-CM | POA: Insufficient documentation

## 2015-12-31 DIAGNOSIS — E039 Hypothyroidism, unspecified: Secondary | ICD-10-CM | POA: Insufficient documentation

## 2015-12-31 DIAGNOSIS — Z7901 Long term (current) use of anticoagulants: Secondary | ICD-10-CM | POA: Insufficient documentation

## 2015-12-31 DIAGNOSIS — E119 Type 2 diabetes mellitus without complications: Secondary | ICD-10-CM | POA: Insufficient documentation

## 2015-12-31 DIAGNOSIS — Z8739 Personal history of other diseases of the musculoskeletal system and connective tissue: Secondary | ICD-10-CM | POA: Diagnosis not present

## 2015-12-31 DIAGNOSIS — I129 Hypertensive chronic kidney disease with stage 1 through stage 4 chronic kidney disease, or unspecified chronic kidney disease: Secondary | ICD-10-CM | POA: Insufficient documentation

## 2015-12-31 DIAGNOSIS — S20212A Contusion of left front wall of thorax, initial encounter: Secondary | ICD-10-CM | POA: Insufficient documentation

## 2015-12-31 DIAGNOSIS — N182 Chronic kidney disease, stage 2 (mild): Secondary | ICD-10-CM | POA: Diagnosis not present

## 2015-12-31 DIAGNOSIS — S40022A Contusion of left upper arm, initial encounter: Secondary | ICD-10-CM | POA: Diagnosis not present

## 2015-12-31 DIAGNOSIS — Z872 Personal history of diseases of the skin and subcutaneous tissue: Secondary | ICD-10-CM | POA: Insufficient documentation

## 2015-12-31 DIAGNOSIS — G4733 Obstructive sleep apnea (adult) (pediatric): Secondary | ICD-10-CM | POA: Insufficient documentation

## 2015-12-31 DIAGNOSIS — Z8719 Personal history of other diseases of the digestive system: Secondary | ICD-10-CM | POA: Diagnosis not present

## 2015-12-31 DIAGNOSIS — X58XXXA Exposure to other specified factors, initial encounter: Secondary | ICD-10-CM | POA: Diagnosis not present

## 2015-12-31 DIAGNOSIS — Y999 Unspecified external cause status: Secondary | ICD-10-CM | POA: Insufficient documentation

## 2015-12-31 DIAGNOSIS — Y929 Unspecified place or not applicable: Secondary | ICD-10-CM | POA: Diagnosis not present

## 2015-12-31 DIAGNOSIS — Y939 Activity, unspecified: Secondary | ICD-10-CM | POA: Insufficient documentation

## 2015-12-31 DIAGNOSIS — Z85038 Personal history of other malignant neoplasm of large intestine: Secondary | ICD-10-CM | POA: Insufficient documentation

## 2015-12-31 DIAGNOSIS — I469 Cardiac arrest, cause unspecified: Secondary | ICD-10-CM | POA: Insufficient documentation

## 2015-12-31 DIAGNOSIS — Z8659 Personal history of other mental and behavioral disorders: Secondary | ICD-10-CM | POA: Diagnosis not present

## 2015-12-31 DIAGNOSIS — Z9981 Dependence on supplemental oxygen: Secondary | ICD-10-CM | POA: Insufficient documentation

## 2016-01-08 NOTE — Telephone Encounter (Signed)
Received call from the emergency room today, Dr. Theotis Burrow. Patient arrived in cardiac arrest. She was in an attempt to resuscitate for a prolonged period of time. Declared dead.  Patient had known atrial fibrillation, hypertension, diabetes mellitus. She was on long-term use of anticoagulant therapy.  MRI of the brain without contrast on 12/13/2015 showed cerebral atrophy and small vessel disease similar to previous x-rays of the brain

## 2016-01-08 NOTE — ED Notes (Signed)
[facility] from residence. Unwitnessed arrest. Last seen normal by family at 29. Found by son unresponsive 1155, CPR started by family. Teacher, music arrived prior to EMS. EMS arrived 1158. AED shock x1 by [employer]. CPR continued for EMS arrival. Asystole, pulseless for EMS transport. Right tibia IO line. Epinephrine x10 PTA. CBG 232. TOD pronounced 1239 by [facility] Dr Rex Kras. NO family arrived at [facility] at TOD

## 2016-01-08 NOTE — ED Notes (Signed)
Next of Kin: Ankita Fabiano [phone]   [address]  [place], [place] [address]

## 2016-01-08 NOTE — Progress Notes (Signed)
Responded to page to support family. Patient passed DOA. Family at bedside. Family did not know funeral hone and was given patient placement card.    01-03-2016 1400  Clinical Encounter Type  Visited With Patient and family together;Health care provider  Visit Type Initial;Death;ED  Referral From Nurse  Spiritual Encounters  Spiritual Needs Emotional;Grief support  Stress Factors  Family Stress Factors Loss  Jaclynn Major, [place]

## 2016-01-08 NOTE — ED Provider Notes (Addendum)
CSN: [id number]     Arrival date & time 2016-01-04  1246 History   First MD Initiated Contact with Patient 2016-01-04 1247     Chief Complaint  Patient presents with  . Cardiac Arrest     (Consider location/radiation/quality/duration/timing/severity/associated sxs/prior Treatment) HPI Comments: 82yo F w/ extensive PMH including CAD s/p MI, DM, CKD, OSA, A fib presents in cardiac arrest. History obtained from EMS due to the patient's unresponsiveness. EMS reports that they picked the patient up from home where she became responsive around 11 AM while she was on a couch. CPR was initiated on scene and the patient has had 10 rounds of epinephrine and one initial defibrillation. Since then, she has been in PEA or asystole. Blood glucose was 230.  LEVEL 5 CAVEAT APPLIES 2/2 UNRESPONSIVENESS  The history is provided by the EMS personnel.    Past Medical History  Diagnosis Date  . OSA (obstructive sleep apnea)     AHI-44/hr, AHI during REM 61.8/hr, avg O2 during REM and NREM 96%  . Atrial fibrillation ([place])   . Myocardial infarct ([place])   . Diabetes mellitus   . Hearing loss   . Hypertension   . Hyperlipidemia   . Senile degeneration of brain   . Other generalized ischemic cerebrovascular disease   . Personal history of fall   . Pain in joint, shoulder region   . 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 Loss of weight   . Chronic kidney disease, stage II (mild)   . Obstructive sleep apnea (adult) (pediatric)   . Senile degeneration of brain   . Dizziness and giddiness   . Personal history of noncompliance with medical treatment, presenting hazards to health   . Dyskinesia of esophagus   . Shortness of breath   . Chest pain, unspecified 06/19/2009  . Long term (current) use of anticoagulants   . Unspecified hereditary and idiopathic peripheral neuropathy   . Pain in joint, site unspecified   . Carpal tunnel syndrome   . Other hammer toe (acquired)   . Alopecia  areata   . Dysphagia, unspecified(787.20)   . Occlusion and stenosis of carotid artery without mention of cerebral infarction   . Other symptoms involving cardiovascular system   . Other acne   . Allergic rhinitis, cause unspecified   . Type II or unspecified type diabetes mellitus without mention of complication, uncontrolled   . Varicose veins of lower extremities   . Cervicalgia   . Disturbance of salivary secretion   . Insomnia, unspecified   . Unspecified hypothyroidism   . Palpitations 715.90  . Other malaise and fatigue   . Diverticulosis of colon (without mention of hemorrhage)   . Benign neoplasm of colon   . Depressive disorder, not elsewhere classified   . Syncope and collapse   . Paroxysmal atrial fibrillation (H[facility])   . Hypertension   . Dizziness    Past Surgical History  Procedure Laterality Date  . Total abdominal hysterectomy    . Tonsillectomy    . Appendectomy    . Hand surgery    . Cataract od  10/04/2012  . Cataract os  01/31/2013  . Carotid doppler  07/25/2012    Bilat internal carotid arteries demonstrate 1-395 stenoses.  . Transthoracic echocardiogram  04/24/2011    EF >55%, moderate tricuspid regurg.   Family History  Problem Relation Age of Onset  . Cervical cancer Mother   . Cancer Mother   . Parkinsonism Father   .  Sleep apnea Brother   . Diabetes Brother   . Sleep apnea Brother   . Peripheral vascular disease Brother   . Cancer Daughter    Social History  Substance Use Topics  . Smoking status: Never Smoker   . Smokeless tobacco: Never Used  . Alcohol Use: No   OB History    No data available     Review of Systems  Unable to perform ROS: Patient unresponsive      Allergies  Ace inhibitors and Aldactone  Home Medications   Prior to Admission medications   Medication Sig Start Date End Date Taking? Authorizing Provider  amiodarone (PACERONE) 200 MG tablet Take one tablet by mouth once daily 10/11/15   Estill Dooms, MD   amLODipine (NORVASC) 5 MG tablet Take one tablet by mouth once daily to control blood pressure 08/13/15   Estill Dooms, MD  calcium carbonate (TUMS - DOSED IN MG ELEMENTAL CALCIUM) 500 MG chewable tablet Chew 2 tablets by mouth daily as needed for indigestion or heartburn.    Historical Provider, MD  estradiol (ESTRACE) 0.5 MG tablet TAKE 1 TABLET EVERY DAY FOR HORMONES 12/03/15   Estill Dooms, MD  feeding supplement, ENSURE ENLIVE, (ENSURE ENLIVE) LIQD Take 237 mLs by mouth 2 (two) times daily between meals. 06/09/15   Shanker Kristeen Mans, MD  hydroxypropyl methylcellulose (ISOPTO TEARS) 2.5 % ophthalmic solution Place 1 drop into both eyes as needed for dry eyes.    Historical Provider, MD  levothyroxine (SYNTHROID, LEVOTHROID) 100 MCG tablet Take 1 tablet (100 mcg total) by mouth daily. 06/12/15   Estill Dooms, MD  lisinopril (PRINIVIL,ZESTRIL) 5 MG tablet TAKE 1 TABLET BY MOUTH EVERY DAY 06/24/15   Lorretta Harp, MD  metFORMIN (GLUCOPHAGE) 500 MG tablet One each morning to lower glucose 11/06/15   Estill Dooms, MD  risperiDONE (RISPERDAL) 1 MG tablet One nightly to help nerves 11/06/15   Estill Dooms, MD  warfarin (COUMADIN) 5 MG tablet Take 1 tablets by mouth daily as directed by coumadin clinic 06/09/15   Jonetta Osgood, MD  warfarin (COUMADIN) 5 MG tablet TAKE 1 TO 1.5 TABLETS BY MOUTH DAILY AS DIRECTED BY COUMADIN CLINIC 12/23/15   Dani Gobble Croitoru, MD   There were no vitals taken for this visit. Physical Exam  Constitutional:  Thin, cyanotic, pale elderly woman unresponsive  HENT:  Head: Normocephalic and atraumatic.  Eyes: Right eye exhibits no discharge. Left eye exhibits no discharge.  Pupils fixed and dilated  Cardiovascular:  Linton Rump in place w/ CPR in progress  Pulmonary/Chest:  Apneic, bagging  Abdominal: Soft. She exhibits no distension.  Musculoskeletal:  No deformity  Neurological:  unresponsive  Skin: There is pallor.  Large area of ecchymoses on L lateral chest  extending down L arm  Nursing note and vitals reviewed.   ED Course  .Critical Care Performed by: Sharlett Iles Authorized by: Sharlett Iles Total critical care time: 30 minutes Critical care time was exclusive of separately billable procedures and treating other patients. Critical care was necessary to treat or prevent imminent or life-threatening deterioration of the following conditions: cardiac failure. Critical care was time spent personally by me on the following activities: evaluation of patient's response to treatment, examination of patient, obtaining history from patient or surrogate, pulse oximetry, re-evaluation of patient's condition and review of old charts.   (including critical care time) Labs Review Labs Reviewed - No data to display   MDM   Final  diagnoses:  Cardiac arrest [facility])   She presents by EMS after an arrest at home. CPR was initiated on scene and continued during transport, patient received multiple doses of epinephrine without any change in cardiac activity. On arrival, the patient was unresponsive, pulseless, and apneic. Held compressions and reconfirmed rhythm which was asystole. Given that she has had CPR for greater than 1 hour with no change in rhythm, discontinued efforts due to the futility. Time of death pronounced at 12:39. I have contacted the patient's primary care provider, Dr. Jeanmarie Hubert, and discussed patient's presentation. He is willing to sign the patient's death certificate. I'll discuss with family when they arrive.    Sharlett Iles, MD 01-17-2016 [place], MD 2016-01-17 [phone]

## 2016-01-08 DEATH — deceased

## 2016-04-21 IMAGING — MR MR ORBITS WO/W CM
14 of 18 series · 32 of 48 positions shown · IV contrast (multihance)
Comparison: CT head 05/21/2015.

CLINICAL DATA: Patient with history of cataract surgery, now
presenting with sudden loss of vision in the RIGHT eye 8 weeks ago.

EXAM:
MRI HEAD AND ORBITS WITHOUT AND WITH CONTRAST
TECHNIQUE: Multiplanar, multiecho pulse sequences of the brain and surrounding
structures were obtained without and with intravenous contrast.
Multiplanar, multiecho pulse sequences of the orbits and surrounding
structures were obtained including fat saturation techniques, before
and after intravenous contrast administration.
CONTRAST:  11mL MULTIHANCE GADOBENATE DIMEGLUMINE 529 MG/ML IV SOLN
Creatinine was obtained on site at [HOSPITAL] at [HOSPITAL].
Results: Creatinine 1.1 mg/dL.

[Series 7: T1 · sagittal · 4.0mm · 0.75mm/px · 1 of 27 slices shown (1 of 3)]
[im 1/27]
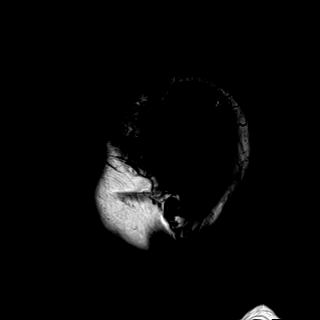

[Series 8: DWI · axial · 3.0mm · 1.44mm/px · z∈[-56,+83]mm · 4 of 80 slices shown (1 of 4)]
[im 1/80]
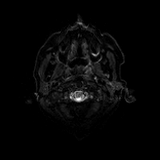
[im 27/80]
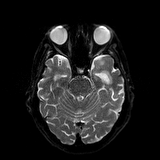
[im 53/80]
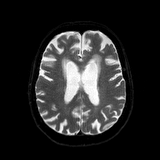
[im 80/80]
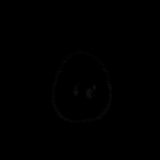

[Series 9: DWI · axial · 3.0mm · 1.44mm/px · z∈[-56,+83]mm · 2 of 40 slices shown (2 of 4)]
[im 1/40]
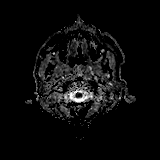
[im 40/40]
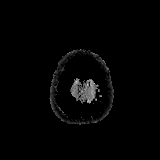

[Series 10: DWI · coronal · 5.0mm · 1.44mm/px · 3 of 56 slices shown (3 of 4)]
[im 1/56]
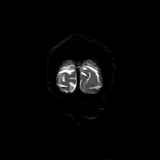
[im 28/56]
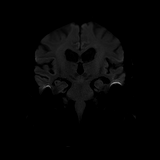
[im 56/56]
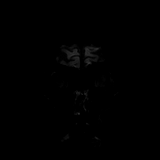

[Series 11: DWI · coronal · 5.0mm · 1.44mm/px · 2 of 28 slices shown (4 of 4)]
[im 1/28]
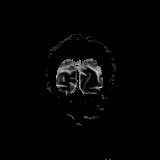
[im 28/28]
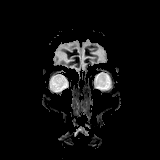

[Series 12: T2 · axial · 4.0mm · 0.36mm/px · 1 of 27 slices shown]
[im 1/27]
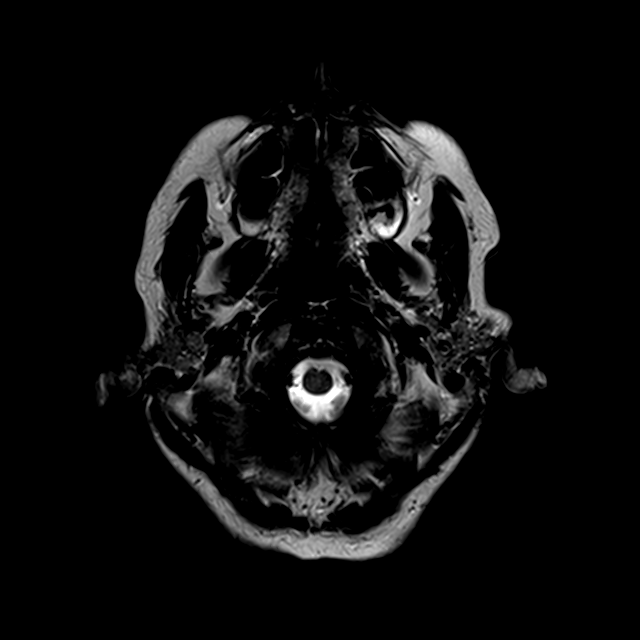

[Series 13: FLAIR · axial · 4.0mm · 0.72mm/px · 1 of 27 slices shown]
[im 1/27]
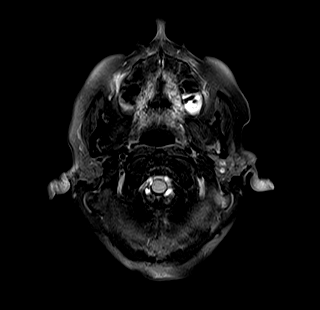

[Series 16: T1 · axial · 1.0mm · 0.90mm/px · z∈[-53,+89]mm · 8 of 144 slices shown (2 of 3)]
[im 1/144]
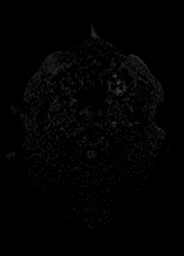
[im 21/144]
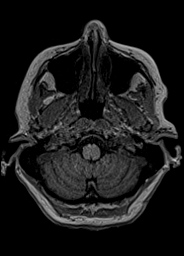
[im 41/144]
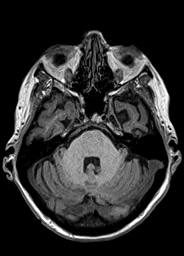
[im 62/144]
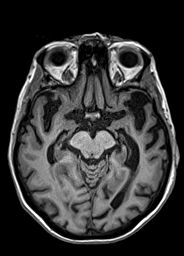
[im 82/144]
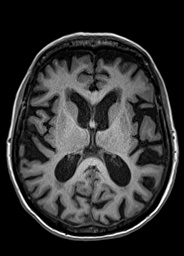
[im 103/144]
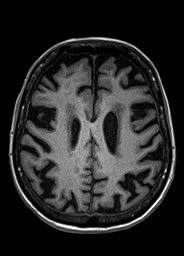
[im 123/144]
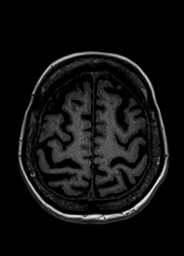
[im 144/144]
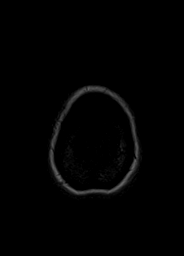

[Series 17: T2 fat-sat · axial · 2.5mm · 0.28mm/px · 1 of 21 slices shown (1 of 2)]
[im 1/21]
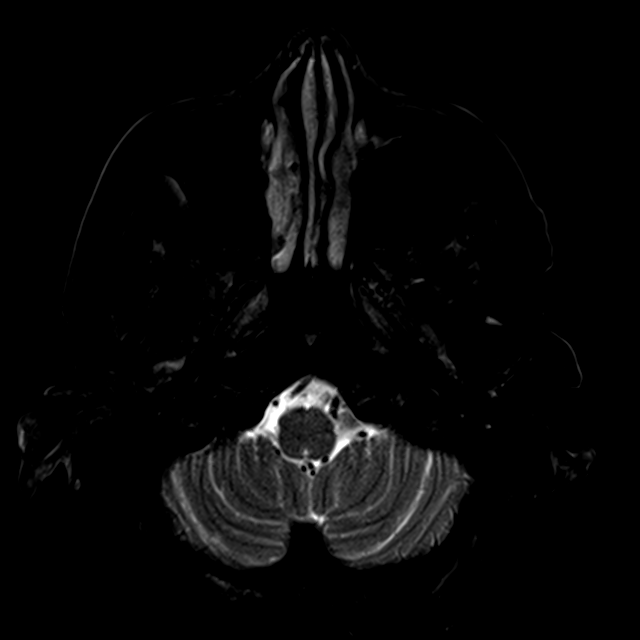

[Series 18: T2 fat-sat · coronal · 2.5mm · 0.28mm/px · 2 of 29 slices shown (2 of 2)]
[im 1/29]
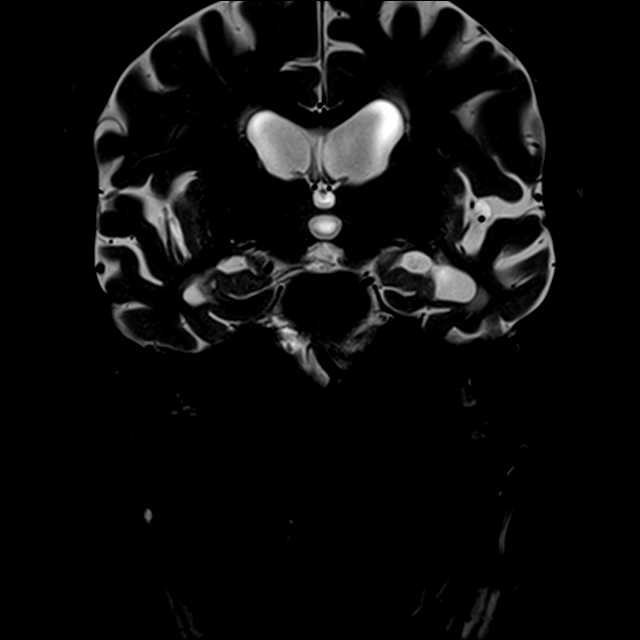
[im 29/29]
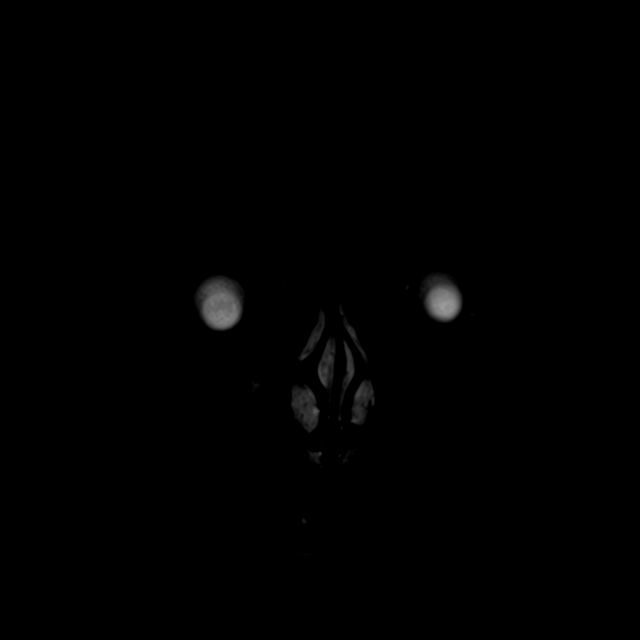

[Series 19: T1 · axial · 2.5mm · 0.28mm/px · 1 of 21 slices shown (3 of 3)]
[im 1/21]
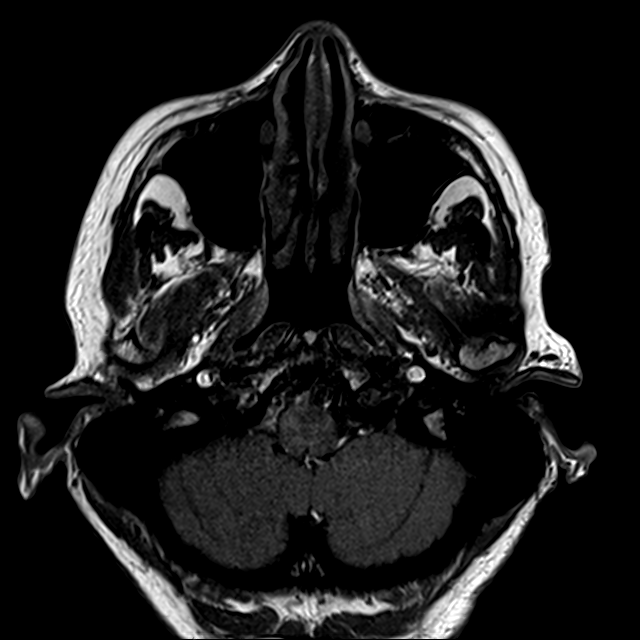

[Series 22: T1 post-contrast · coronal · 2.5mm · 0.28mm/px · 2 of 29 slices shown (1 of 2)]
[im 1/29]
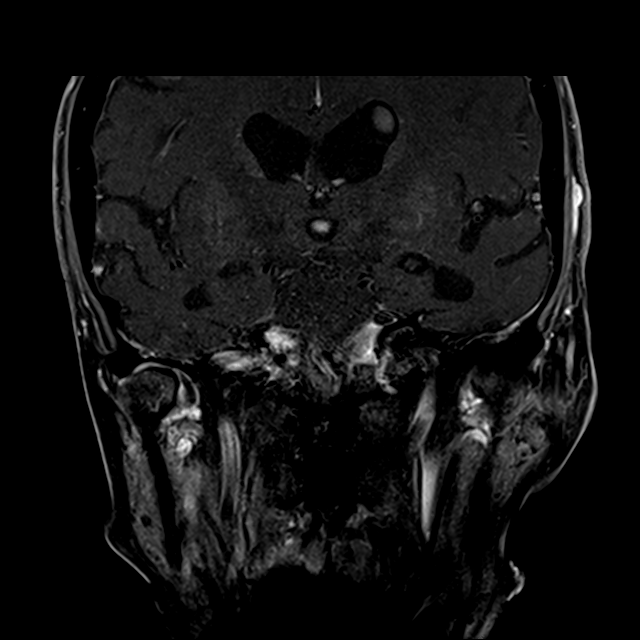
[im 29/29]
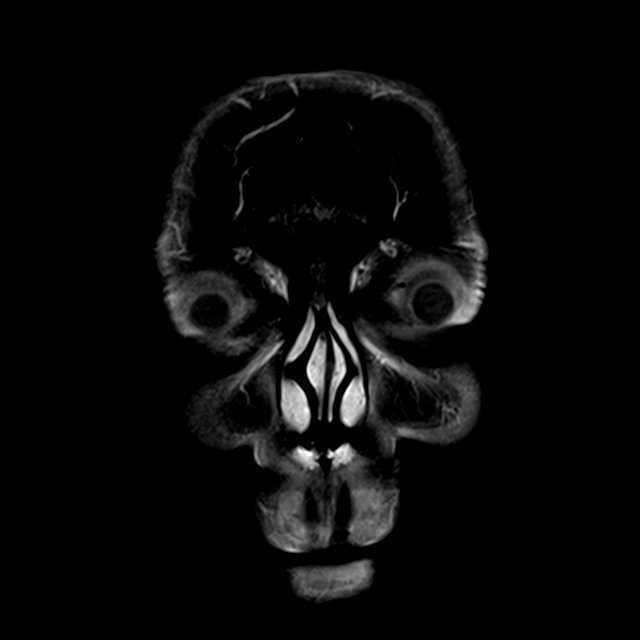

[Series 23: T2 post-contrast · coronal · 4.5mm · 0.36mm/px · 2 of 29 slices shown]
[im 1/29]
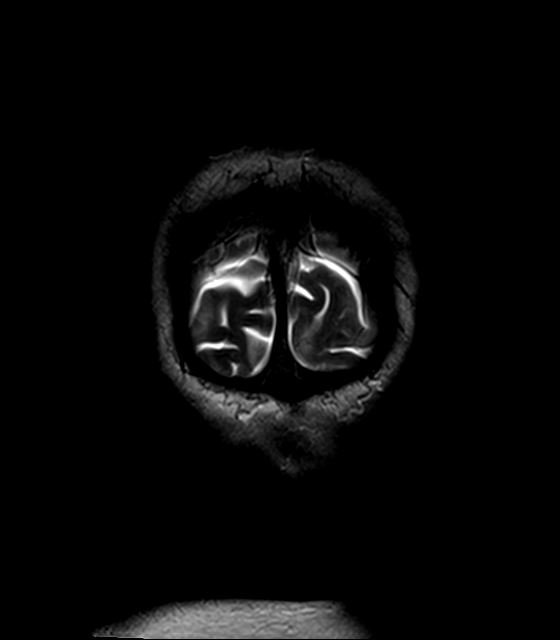
[im 29/29]
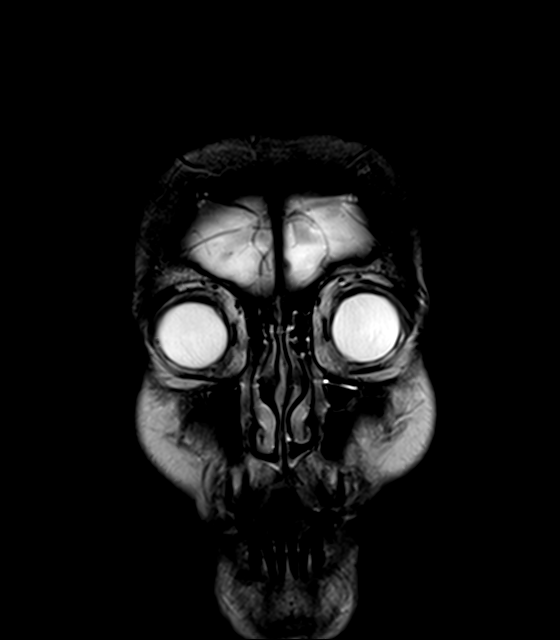

[Series 24: T1 post-contrast · coronal · 4.5mm · 0.90mm/px · 2 of 29 slices shown (2 of 2)]
[im 1/29]
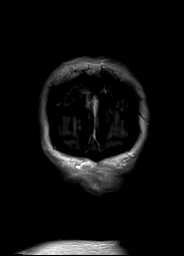
[im 29/29]
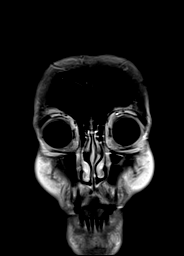

[32 of 48 positions shown; findings below may reference images not displayed]

FINDINGS: MRI HEAD FINDINGS

No evidence for acute infarction, hemorrhage, mass lesion, or
extra-axial fluid. Global atrophy. Hydrocephalus ex vacuo. Focal and
confluent T2 and FLAIR hyperintensity throughout the white matter,
consistent with chronic microvascular ischemic change.

Flow voids are maintained in the carotid, basilar, and vertebral
arteries. No evidence for RIGHT ICA occlusion. Furthermore flow
voids are maintained in the BILATERAL ophthalmic arteries. Central
retinal artery occlusion cannot be excluded with this study.

Pituitary, pineal, and cerebellar tonsils unremarkable. Cervical
spondylosis. Visualized calvarium, skull base, and upper cervical
osseous structures unremarkable. Scalp and extracranial soft
tissues, orbits, sinuses, and mastoids show no acute process.

Compared with recent CT and MR, no significant change.

MRI ORBITS FINDINGS

The globes are roughly symmetric, and both display unremarkable
postsurgical change status post cataract removal. There is no
proptosis or orbital mass. There is no orbital inflammatory process.
Optic nerves are symmetric in size and normal. Patency of the
BILATERAL ophthalmic arteries and superior ophthalmic veins is
noted. Intracranial optic pathways are unremarkable. No cavernous
sinus lesion. Orbital musculature are symmetric and normal. Post
infusion, no abnormal enhancement. Choroidal blush is symmetric. No
evidence for significant paranasal sinus disease or remote orbital
wall fracture.
IMPRESSION: Atrophy and small vessel disease similar to priors. No acute
intracranial findings.

Unremarkable appearing orbits status post BILATERAL cataract
surgery. No postoperative complication. No features to suggest a
cause for RIGHT eye visual loss.

## 2016-07-27 ENCOUNTER — Ambulatory Visit: Payer: Medicare Other | Admitting: Family

## 2016-07-27 ENCOUNTER — Encounter (HOSPITAL_COMMUNITY): Payer: Medicare Other
